# Patient Record
Sex: Male | Born: 1945 | Race: White | Hispanic: No | Marital: Married | Smoking: Never smoker
Health system: Southern US, Community
[De-identification: ages and names within clinical notes are randomized; demographics above are authoritative.]

## PROBLEM LIST (undated history)

## (undated) DIAGNOSIS — K5790 Diverticulosis of intestine, part unspecified, without perforation or abscess without bleeding: Secondary | ICD-10-CM

## (undated) DIAGNOSIS — I2510 Atherosclerotic heart disease of native coronary artery without angina pectoris: Secondary | ICD-10-CM

## (undated) DIAGNOSIS — C189 Malignant neoplasm of colon, unspecified: Secondary | ICD-10-CM

## (undated) DIAGNOSIS — R748 Abnormal levels of other serum enzymes: Secondary | ICD-10-CM

## (undated) DIAGNOSIS — J309 Allergic rhinitis, unspecified: Secondary | ICD-10-CM

## (undated) DIAGNOSIS — T7840XA Allergy, unspecified, initial encounter: Secondary | ICD-10-CM

## (undated) DIAGNOSIS — N529 Male erectile dysfunction, unspecified: Secondary | ICD-10-CM

## (undated) DIAGNOSIS — C61 Malignant neoplasm of prostate: Secondary | ICD-10-CM

## (undated) DIAGNOSIS — K635 Polyp of colon: Secondary | ICD-10-CM

## (undated) DIAGNOSIS — E785 Hyperlipidemia, unspecified: Secondary | ICD-10-CM

## (undated) DIAGNOSIS — K5792 Diverticulitis of intestine, part unspecified, without perforation or abscess without bleeding: Secondary | ICD-10-CM

## (undated) DIAGNOSIS — H8109 Meniere's disease, unspecified ear: Secondary | ICD-10-CM

## (undated) DIAGNOSIS — H3320 Serous retinal detachment, unspecified eye: Secondary | ICD-10-CM

## (undated) DIAGNOSIS — I10 Essential (primary) hypertension: Secondary | ICD-10-CM

## (undated) DIAGNOSIS — K219 Gastro-esophageal reflux disease without esophagitis: Secondary | ICD-10-CM

## (undated) DIAGNOSIS — H269 Unspecified cataract: Secondary | ICD-10-CM

## (undated) DIAGNOSIS — R011 Cardiac murmur, unspecified: Secondary | ICD-10-CM

## (undated) HISTORY — DX: Malignant neoplasm of prostate: C61

## (undated) HISTORY — DX: Allergy, unspecified, initial encounter: T78.40XA

## (undated) HISTORY — DX: Diverticulosis of intestine, part unspecified, without perforation or abscess without bleeding: K57.90

## (undated) HISTORY — PX: OTHER SURGICAL HISTORY: SHX169

## (undated) HISTORY — DX: Unspecified cataract: H26.9

## (undated) HISTORY — DX: Hemochromatosis, unspecified: E83.119

## (undated) HISTORY — PX: PROSTATECTOMY: SHX69

## (undated) HISTORY — DX: Hyperlipidemia, unspecified: E78.5

## (undated) HISTORY — DX: Gastro-esophageal reflux disease without esophagitis: K21.9

## (undated) HISTORY — DX: Meniere's disease, unspecified ear: H81.09

## (undated) HISTORY — DX: Diverticulitis of intestine, part unspecified, without perforation or abscess without bleeding: K57.92

## (undated) HISTORY — DX: Essential (primary) hypertension: I10

## (undated) HISTORY — DX: Malignant neoplasm of colon, unspecified: C18.9

## (undated) HISTORY — DX: Cardiac murmur, unspecified: R01.1

## (undated) HISTORY — DX: Atherosclerotic heart disease of native coronary artery without angina pectoris: I25.10

## (undated) HISTORY — DX: Male erectile dysfunction, unspecified: N52.9

## (undated) HISTORY — PX: APPENDECTOMY: SHX54

## (undated) HISTORY — PX: POLYPECTOMY: SHX149

## (undated) HISTORY — DX: Polyp of colon: K63.5

## (undated) HISTORY — DX: Allergic rhinitis, unspecified: J30.9

## (undated) HISTORY — DX: Serous retinal detachment, unspecified eye: H33.20

## (undated) HISTORY — DX: Abnormal levels of other serum enzymes: R74.8

## (undated) HISTORY — PX: RETINAL DETACHMENT SURGERY: SHX105

---

## 2009-04-29 ENCOUNTER — Encounter: Admission: RE | Admit: 2009-04-29 | Discharge: 2009-04-29 | Payer: Self-pay | Admitting: Internal Medicine

## 2011-09-13 DIAGNOSIS — R972 Elevated prostate specific antigen [PSA]: Secondary | ICD-10-CM | POA: Insufficient documentation

## 2012-02-06 HISTORY — PX: BASAL CELL CARCINOMA EXCISION: SHX1214

## 2013-02-13 DIAGNOSIS — C61 Malignant neoplasm of prostate: Secondary | ICD-10-CM | POA: Insufficient documentation

## 2013-03-09 DIAGNOSIS — I10 Essential (primary) hypertension: Secondary | ICD-10-CM | POA: Insufficient documentation

## 2013-12-03 ENCOUNTER — Other Ambulatory Visit (HOSPITAL_COMMUNITY): Payer: Self-pay | Admitting: Gastroenterology

## 2013-12-03 DIAGNOSIS — R7989 Other specified abnormal findings of blood chemistry: Secondary | ICD-10-CM

## 2013-12-03 DIAGNOSIS — R945 Abnormal results of liver function studies: Principal | ICD-10-CM

## 2013-12-03 DIAGNOSIS — K760 Fatty (change of) liver, not elsewhere classified: Secondary | ICD-10-CM

## 2013-12-30 ENCOUNTER — Ambulatory Visit (HOSPITAL_COMMUNITY): Payer: Medicare Other

## 2014-01-13 ENCOUNTER — Ambulatory Visit (HOSPITAL_COMMUNITY)
Admission: RE | Admit: 2014-01-13 | Discharge: 2014-01-13 | Disposition: A | Discharge status: Home / Self Care | Payer: Medicare Other | Source: Ambulatory Visit | Attending: Gastroenterology | Admitting: Gastroenterology

## 2014-01-13 DIAGNOSIS — K760 Fatty (change of) liver, not elsewhere classified: Secondary | ICD-10-CM

## 2014-01-13 DIAGNOSIS — R945 Abnormal results of liver function studies: Secondary | ICD-10-CM

## 2014-01-13 DIAGNOSIS — R7989 Other specified abnormal findings of blood chemistry: Secondary | ICD-10-CM

## 2014-05-26 ENCOUNTER — Encounter: Payer: Self-pay | Admitting: Cardiology

## 2014-05-26 ENCOUNTER — Ambulatory Visit (INDEPENDENT_AMBULATORY_CARE_PROVIDER_SITE_OTHER): Payer: Medicare HMO | Admitting: Cardiology

## 2014-05-26 VITALS — BP 130/80 | HR 88 | Ht 71.0 in | Wt 220.6 lb

## 2014-05-26 DIAGNOSIS — R42 Dizziness and giddiness: Secondary | ICD-10-CM | POA: Diagnosis not present

## 2014-05-26 DIAGNOSIS — R011 Cardiac murmur, unspecified: Secondary | ICD-10-CM

## 2014-05-26 DIAGNOSIS — R0609 Other forms of dyspnea: Secondary | ICD-10-CM | POA: Insufficient documentation

## 2014-05-26 NOTE — Patient Instructions (Signed)
Medication Instructions:   Your physician recommends that you continue on your current medications as directed. Please refer to the Current Medication list given to you today.     Testing/Procedures:  Your physician has requested that you have an echocardiogram. Echocardiography is a painless test that uses sound waves to create images of your heart. It provides your doctor with information about the size and shape of your heart and how well your heart's chambers and valves are working. This procedure takes approximately one hour. There are no restrictions for this procedure.   Your physician has requested that you have a stress echocardiogram. For further information please visit [email or web address]. Please follow instruction sheet as given.     Follow-Up:  Your physician recommends that you schedule a follow-up appointment in: [place]

## 2014-05-26 NOTE — Progress Notes (Signed)
Patient ID: Jeffrey Stein, male   DOB: [birth year], 69 y.o.   MRN: [id number]      Cardiology Office Note   Date:  05/26/2014   ID:  Jeffrey Stein, DOB Feb 26, 1945, MRN [id number]  PCP:  Dorian Heckle, MD  Cardiologist:  Dorothy Spark, MD   Chief Complaint  Patient presents with  . Appointment    consult   CC: DOE History of Present Illness: Jeffrey Stein is a 69 y.o. male who presents for the evaluation of DOE. The patient with h/o HTN has been experiencing DOE for the last 6 years. Six years ago underwent a treadmill test that was negative for ischemia, and normal PFT. He is retired and minimally active. He states that anytime he tries to walk upstairs he gets SOB very quickly and starts wheezing. It has been getting worse over years, he denies chest pain or resting SOB, no orthopnea or PND. No palpitations. He describes one episode of syncope after sitting in the chair for prolonged time and walking to the bathroom, with immediate recovery of concioussness. He has LE edema that is chronic.  He has no h/o asthma, and has never smoked. His father died of SCD, was smoker.     Past Medical History  Diagnosis Date  . Meniere's disease   . GERD (gastroesophageal reflux disease)   . HTN (hypertension)   . Allergic rhinitis   . Hemochromatosis   . Colon cancer   . Prostate cancer   . Hyperlipemia   . Abnormal liver enzymes     Past Surgical History  Procedure Laterality Date  . Prostatectomy       Current Outpatient Prescriptions  Medication Sig Dispense Refill  . amLODipine (NORVASC) 10 MG tablet Take 10 mg by mouth daily.    Marland Kitchen aspirin EC 81 MG tablet Take 81 mg by mouth daily.    . hydrochlorothiazide (HYDRODIURIL) 25 MG tablet Take 1 tablet by mouth daily.    Marland Kitchen losartan (COZAAR) 100 MG tablet Take 1 tablet by mouth daily.    . potassium chloride SA (K-DUR,KLOR-CON) 20 MEQ tablet Take 2 tablets by mouth daily.    . vitamin E 400 UNIT capsule Take 2 capsules by  mouth daily.     No current facility-administered medications for this visit.    Allergies:   Penicillin g; Iodinated diagnostic agents; and Shellfish allergy    Social History:  The patient  reports that he has never smoked. He does not have any smokeless tobacco history on file. He reports that he drinks alcohol. He reports that he does not use illicit drugs.   Family History:  The patient's family history includes Colon cancer in his mother; Emphysema in his father.    ROS:  Please see the history of present illness.  All other systems are reviewed and negative.    PHYSICAL EXAM: VS:  BP 130/80 mmHg  Pulse 88  Ht 5\' 11"  (1.803 m)  Wt 220 lb 9.6 oz (100.064 kg)  BMI 30.78 kg/m2 , BMI Body mass index is 30.78 kg/(m^2). GEN: Well nourished, well developed, in no acute distress HEENT: normal Neck: no JVD, carotid bruits, or masses Cardiac: RRR; 3/6 holosystolic murmur, rubs, or gallops,no edema  Respiratory:  clear to auscultation bilaterally, normal work of breathing GI: soft, nontender, nondistended, + BS MS: no deformity or atrophy, mild B/L LE edema Skin: warm and dry, no rash Neuro:  Strength and sensation are intact Psych: euthymic mood, full affect  EKG:  EKG :  SR, non-specific ST-T wave abnormalities  Recent Labs: No results found for requested labs within last 365 days.    Lipid Panel No results found for: CHOL, TRIG, HDL, CHOLHDL, VLDL, LDLCALC, LDLDIRECT    Wt Readings from Last 3 Encounters:  05/26/14 220 lb 9.6 oz (100.064 kg)     ASSESSMENT AND PLAN:  1.  DOE - loud murmur, we will order echocardiogram to evaluate, rule out LVOT obstruction, systolic and diastolic function. We will also order stress echocardiogram to evaluate for ischemia.   2. LE edema - no change in medication until echo results back  3. HTN - controlled on current regimen.   Labs/ tests ordered today include:  Orders Placed This Encounter  Procedures  . EKG 12-Lead  .  Echocardiogram stress test without contrast  . 2D Echocardiogram without contrast   Disposition:   FU with Kaydn Kumpf H in 3 months.  Signed, Dorothy Spark, MD  05/26/2014 5:08 PM    [facility] [place], [place], [place]  [address] Phone: [phone]; Fax: [phone]

## 2014-06-22 ENCOUNTER — Telehealth (HOSPITAL_COMMUNITY): Payer: Self-pay

## 2014-06-22 ENCOUNTER — Other Ambulatory Visit: Payer: Self-pay | Admitting: Cardiology

## 2014-06-22 DIAGNOSIS — R0609 Other forms of dyspnea: Secondary | ICD-10-CM

## 2014-06-22 NOTE — Telephone Encounter (Signed)
Patient given detailed instructions per Stress Test Requisition Sheet for test on 06-23-2014 at 3:00pm, arrive at 1:45 for 2D Echo. Patient verbalized understanding. Oletta Lamas, Zahira Brummond A

## 2014-06-23 ENCOUNTER — Ambulatory Visit (HOSPITAL_COMMUNITY): Payer: Medicare HMO | Attending: Cardiovascular Disease

## 2014-06-23 ENCOUNTER — Ambulatory Visit (HOSPITAL_BASED_OUTPATIENT_CLINIC_OR_DEPARTMENT_OTHER): Payer: Medicare HMO

## 2014-06-23 ENCOUNTER — Other Ambulatory Visit: Payer: Self-pay

## 2014-06-23 DIAGNOSIS — R42 Dizziness and giddiness: Secondary | ICD-10-CM

## 2014-06-23 DIAGNOSIS — R0609 Other forms of dyspnea: Secondary | ICD-10-CM | POA: Diagnosis not present

## 2014-06-23 DIAGNOSIS — E785 Hyperlipidemia, unspecified: Secondary | ICD-10-CM | POA: Diagnosis not present

## 2014-06-23 DIAGNOSIS — R011 Cardiac murmur, unspecified: Secondary | ICD-10-CM

## 2014-06-23 DIAGNOSIS — Z85038 Personal history of other malignant neoplasm of large intestine: Secondary | ICD-10-CM | POA: Diagnosis not present

## 2014-06-23 DIAGNOSIS — I10 Essential (primary) hypertension: Secondary | ICD-10-CM | POA: Diagnosis not present

## 2014-06-23 DIAGNOSIS — Z8546 Personal history of malignant neoplasm of prostate: Secondary | ICD-10-CM | POA: Diagnosis not present

## 2014-06-23 DIAGNOSIS — K219 Gastro-esophageal reflux disease without esophagitis: Secondary | ICD-10-CM | POA: Diagnosis not present

## 2014-06-23 LAB — ECHOCARDIOGRAM STRESS TEST
Estimated workload: 10.5 METS
Peak HR: 151 {beats}/min
Percent of predicted max HR: 99 %
Stage 1 DBP: 100 mmHg
Stage 1 Grade: 0 %
Stage 1 HR: 97 {beats}/min
Stage 1 SBP: 137 mmHg
Stage 1 Speed: 0 mph
Stage 2 DBP: 96 mmHg
Stage 2 Grade: 0 %
Stage 2 HR: 102 {beats}/min
Stage 2 SBP: 123 mmHg
Stage 2 Speed: 0 mph
Stage 3 Grade: 0 %
Stage 3 HR: 102 {beats}/min
Stage 3 Speed: 0 mph
Stage 4 DBP: 84 mmHg
Stage 4 Grade: 10 %
Stage 4 HR: 113 {beats}/min
Stage 4 SBP: 148 mmHg
Stage 4 Speed: 1.7 mph
Stage 5 DBP: 77 mmHg
Stage 5 Grade: 12 %
Stage 5 HR: 127 {beats}/min
Stage 5 SBP: 176 mmHg
Stage 5 Speed: 2.5 mph
Stage 6 DBP: 81 mmHg
Stage 6 Grade: 14 %
Stage 6 HR: 150 {beats}/min
Stage 6 SBP: 196 mmHg
Stage 6 Speed: 3.4 mph
Stage 7 Grade: 16 %
Stage 7 HR: 151 {beats}/min
Stage 7 Speed: 4.2 mph
Stage 8 DBP: 83 mmHg
Stage 8 Grade: 0 %
Stage 8 HR: 136 {beats}/min
Stage 8 SBP: 193 mmHg
Stage 8 Speed: 0 mph
Stage 9 DBP: 74 mmHg
Stage 9 Grade: 0 %
Stage 9 HR: 97 {beats}/min
Stage 9 SBP: 135 mmHg
Stage 9 Speed: 0 mph

## 2014-07-21 ENCOUNTER — Encounter: Payer: Self-pay | Admitting: Cardiology

## 2014-07-29 ENCOUNTER — Encounter: Payer: Self-pay | Admitting: Cardiology

## 2014-09-06 ENCOUNTER — Ambulatory Visit: Payer: Self-pay | Admitting: Cardiology

## 2014-09-24 ENCOUNTER — Ambulatory Visit (INDEPENDENT_AMBULATORY_CARE_PROVIDER_SITE_OTHER): Payer: Medicare HMO | Admitting: Cardiology

## 2014-09-24 ENCOUNTER — Encounter: Payer: Self-pay | Admitting: Cardiology

## 2014-09-24 VITALS — BP 132/70 | HR 72 | Ht 71.0 in | Wt 221.0 lb

## 2014-09-24 DIAGNOSIS — R0609 Other forms of dyspnea: Secondary | ICD-10-CM

## 2014-09-24 DIAGNOSIS — R0600 Dyspnea, unspecified: Secondary | ICD-10-CM

## 2014-09-24 DIAGNOSIS — E785 Hyperlipidemia, unspecified: Secondary | ICD-10-CM

## 2014-09-24 DIAGNOSIS — I10 Essential (primary) hypertension: Secondary | ICD-10-CM

## 2014-09-24 DIAGNOSIS — I350 Nonrheumatic aortic (valve) stenosis: Secondary | ICD-10-CM

## 2014-09-24 LAB — COMPREHENSIVE METABOLIC PANEL
ALT: 75 U/L — ABNORMAL HIGH (ref 0–53)
AST: 63 U/L — ABNORMAL HIGH (ref 0–37)
Albumin: 4.3 g/dL (ref 3.5–5.2)
Alkaline Phosphatase: 39 U/L (ref 39–117)
BUN: 35 mg/dL — ABNORMAL HIGH (ref 6–23)
CO2: 26 mEq/L (ref 19–32)
Calcium: 9.4 mg/dL (ref 8.4–10.5)
Chloride: 100 mEq/L (ref 96–112)
Creatinine, Ser: 1.87 mg/dL — ABNORMAL HIGH (ref 0.40–1.50)
GFR: 38.28 mL/min — ABNORMAL LOW (ref >60.00)
Glucose, Bld: 118 mg/dL — ABNORMAL HIGH (ref 70–99)
Potassium: 3.5 mEq/L (ref 3.5–5.1)
Sodium: 137 mEq/L (ref 135–145)
Total Bilirubin: 1 mg/dL (ref 0.2–1.2)
Total Protein: 8.1 g/dL (ref 6.0–8.3)

## 2014-09-24 NOTE — Patient Instructions (Signed)
Medication Instructions:   Your physician recommends that you continue on your current medications as directed. Please refer to the Current Medication list given to you today.    Labwork:  TODAY---CMET AND NMR WITH LIPIDS     Follow-Up:  Your physician wants you to follow-up in: [place] will receive a reminder letter in the mail two months in advance. If you don't receive a letter, please call our office to schedule the follow-up appointment.

## 2014-09-24 NOTE — Progress Notes (Signed)
Patient ID: Jeffrey Stein, male   DOB: 13-Mar-1945, 69 y.o.   MRN: [id number]      Cardiology Office Note  Date:  09/24/2014   ID:  Jeffrey Stein, DOB 23-Dec-1945, MRN [id number]  PCP:  Dorian Heckle, MD  Cardiologist:  Dorothy Spark, MD   No chief complaint on file.  CC: DOE  History of Present Illness: Jeffrey Stein is a 69 y.o. male who presents for the evaluation of DOE. The patient with h/o HTN has been experiencing DOE for the last 6 years. Six years ago underwent a treadmill test that was negative for ischemia, and normal PFT. He is retired and minimally active. He states that anytime he tries to walk upstairs he gets SOB very quickly and starts wheezing. It has been getting worse over years, he denies chest pain or resting SOB, no orthopnea or PND. No palpitations. He describes one episode of syncope after sitting in the chair for prolonged time and walking to the bathroom, with immediate recovery of concioussness. He has LE edema that is chronic.  He has no h/o asthma, and has never smoked. His father died of SCD, was smoker.   Follow pu visit, his SOB improved with omeprazole prescribed by pulmonologist, he doubled the dose and developed rash. No CP, palpitations or syncope.   Past Medical History  Diagnosis Date  . Meniere's disease   . GERD (gastroesophageal reflux disease)   . HTN (hypertension)   . Allergic rhinitis   . Hemochromatosis   . Colon cancer   . Prostate cancer   . Hyperlipemia   . Abnormal liver enzymes    Past Surgical History  Procedure Laterality Date  . Prostatectomy     Current Outpatient Prescriptions  Medication Sig Dispense Refill  . amLODipine (NORVASC) 10 MG tablet Take 10 mg by mouth daily.    Marland Kitchen aspirin EC 81 MG tablet Take 81 mg by mouth daily.    . hydrochlorothiazide (HYDRODIURIL) 25 MG tablet Take 1 tablet by mouth daily.    Marland Kitchen losartan (COZAAR) 100 MG tablet Take 1 tablet by mouth daily.    Marland Kitchen omeprazole (PRILOSEC) 40 MG  capsule Take 1 capsule by mouth daily.    . potassium chloride SA (K-DUR,KLOR-CON) 20 MEQ tablet Take 2 tablets by mouth daily.    . vitamin E 400 UNIT capsule Take 2 capsules by mouth daily.     No current facility-administered medications for this visit.   Allergies:   Penicillin g; Iodinated diagnostic agents; Omeprazole; and Shellfish allergy    Social History:  The patient  reports that he has never smoked. He does not have any smokeless tobacco history on file. He reports that he drinks alcohol. He reports that he does not use illicit drugs.   Family History:  The patient's family history includes Colon cancer in his mother; Emphysema in his father.   ROS:  Please see the history of present illness.  All other systems are reviewed and negative.   PHYSICAL EXAM: VS:  BP 132/70 mmHg  Pulse 72  Ht 5\' 11"  (1.803 m)  Wt 221 lb (100.245 kg)  BMI 30.84 kg/m2  SpO2 95% , BMI Body mass index is 30.84 kg/(m^2). GEN: Well nourished, well developed, in no acute distress HEENT: normal Neck: no JVD, carotid bruits, or masses Cardiac: RRR; 3/6 holosystolic murmur, rubs, or gallops,no edema  Respiratory:  clear to auscultation bilaterally, normal work of breathing GI: soft, nontender, nondistended, + BS MS: no deformity or atrophy, mild  B/L LE edema Skin: warm and dry, no rash Neuro:  Strength and sensation are intact Psych: euthymic mood, full affect  EKG:  EKG : SR, non-specific ST-T wave abnormalities  Recent Labs: No results found for requested labs within last 365 days.   Lipid Panel No results found for: CHOL, TRIG, HDL, CHOLHDL, VLDL, LDLCALC, LDLDIRECT   Wt Readings from Last 3 Encounters:  09/24/14 221 lb (100.245 kg)  05/26/14 220 lb 9.6 oz (100.064 kg)    TTE: 06/2014 - Left ventricle: The cavity size was normal. There was mild focal basal hypertrophy of the septum. Systolic function was normal. The estimated ejection fraction was in the range of 55% to  60%. Wall motion was normal; there were no regional wall motion abnormalities. Doppler parameters are consistent with abnormal left ventricular relaxation (grade 1 diastolic dysfunction). - Aortic valve: There was very mild stenosis. Valve area (VTI): 1.8 cm^2. Valve area (Vmean): 1.44 cm^2. - Left atrium: The atrium was mildly dilated.    ASSESSMENT AND PLAN:  1.  DOE - possibly ealated to GERD and reactive airways, echo shows normal LVEF< grade 1 DD, mild AS  2. Mild AS - we will follow in 1 year, echo in 2 years, check lipids, start statins cautiously as he developed LFTs elevation in the past  3. LE edema - improved  4. HTN - controlled on current regimen.  Labs/ tests ordered today include:  Orders Placed This Encounter  Procedures  . EKG 12-Lead  . Echocardiogram stress test without contrast  . 2D Echocardiogram without contrast   Disposition:   FU with Calvert Charland H in 3 months.  Signed, Dorothy Spark, MD  09/24/2014 10:26 AM    [facility] [place], [place], [place]  [address] Phone: [phone]; Fax: [phone]

## 2014-09-27 ENCOUNTER — Telehealth: Payer: Self-pay | Admitting: Cardiology

## 2014-09-27 DIAGNOSIS — R899 Unspecified abnormal finding in specimens from other organs, systems and tissues: Secondary | ICD-10-CM

## 2014-09-27 LAB — NMR LIPOPROFILE WITH LIPIDS
Cholesterol, Total: 255 mg/dL — ABNORMAL HIGH (ref 100–199)
HDL Particle Number: 23.8 umol/L — ABNORMAL LOW (ref >=30.5)
HDL Size: 8 nm — ABNORMAL LOW (ref >=9.2)
HDL-C: 40 mg/dL (ref >39)
LDL (calc): 187 mg/dL — ABNORMAL HIGH (ref 0–99)
LDL Particle Number: 2372 nmol/L — ABNORMAL HIGH (ref <1000)
LDL Size: 20.8 nm (ref >=20.8)
LP-IR Score: 76 — ABNORMAL HIGH (ref <=45)
Large HDL-P: <1.3 umol/L — ABNORMAL LOW (ref >=4.8)
Large VLDL-P: 5.1 nmol/L — ABNORMAL HIGH (ref <=2.7)
Small LDL Particle Number: 1156 nmol/L — ABNORMAL HIGH (ref <=527)
Triglycerides: 139 mg/dL (ref 0–149)
VLDL Size: 46.3 nm (ref <=46.6)

## 2014-09-27 MED ORDER — ATENOLOL 25 MG PO TABS
25.0000 mg | ORAL_TABLET | Freq: Every day | ORAL | Status: DC
Start: 2014-09-27 — End: 2015-08-25

## 2014-09-27 NOTE — Telephone Encounter (Signed)
Pateint called triage about VM. Returned patient's call. Informed patient of Dr. Francesca Oman lab result note. Ordered Atenolol 25 mg by mouth daily  and discontinued HCTZ and Losartan. Ordered and scheduled BMP for October 26, 2014. Patient verbalized understanding.   Notes Recorded by Nuala Alpha, LPN on 9/93/7169 at 6:78 PM Left detailed message on both of the pts confirmed VM that per Dr Meda Coffee his labs indicated abnormal kidney function, and she has new medication and lab orders to endorse to him.  Left detailed message on both of the pts confirmed VM that he should call the office back on Monday 8/22 and ask to speak with a triage nurse to go over lab results and recommendations per Dr Meda Coffee.  Will forward this message to triage to follow-up with the pt on Monday 8/22. Notes Recorded by Dorothy Spark, MD on 09/24/2014 at 4:49 PM This patient has abnormal kidney function, I would recommend to Notes Recorded by Nuala Alpha, LPN on 9/38/1017 at 5:10 PM Left detailed message on both of the pts confirmed VM that per Dr Meda Coffee his labs indicated abnormal kidney function, and she has new medication and lab orders to endorse to him.  Left detailed message on both of the pts confirmed VM that he should call the office back on Monday 8/22 and ask to speak with a triage nurse to go over lab results and recommendations per Dr Meda Coffee.  Will forward this message to triage to follow-up with the pt on Monday 8/22. Notes Recorded by Dorothy Spark, MD on 09/24/2014 at 4:49 PM This patient has abnormal kidney function, I would recommend to discontinue losartan and HCTZ and start atenolol 25 mg po daily for BP. He should have repeat BMP in 1 month.

## 2014-09-27 NOTE — Telephone Encounter (Signed)
Follow Up  Pt states that he received a VM that advised him to call and speak with a triage nurse. Please assist

## 2014-09-28 ENCOUNTER — Telehealth: Payer: Self-pay | Admitting: *Deleted

## 2014-09-28 DIAGNOSIS — E785 Hyperlipidemia, unspecified: Secondary | ICD-10-CM

## 2014-09-28 MED ORDER — ATORVASTATIN CALCIUM 20 MG PO TABS: 20.0000 mg | ORAL_TABLET | Freq: Every day | ORAL | Status: DC

## 2014-09-28 NOTE — Telephone Encounter (Signed)
-----   Message from Dorothy Spark, MD sent at 09/28/2014 12:54 PM EDT ----- His cholesterol is significantly elevated, he needs to be started on atorvastatin 20 mg po daily and have lipids2 months.

## 2014-09-28 NOTE — Telephone Encounter (Signed)
Notified the pt that per Dr Meda Coffee his cholesterol is significantly elevated and she recommends that he start taking atorvastatin 20 mg po daily and have fasting lipids done in 2 months.  Confirmed the pharmacy of choice with the pt.  Advised the pt to come fasting to his lab appt to check lipids.  Lab appt scheduled for 11/29/14 at our office.  Pt verbalized understanding and agrees with this plan.

## 2014-10-26 ENCOUNTER — Other Ambulatory Visit (INDEPENDENT_AMBULATORY_CARE_PROVIDER_SITE_OTHER): Payer: Medicare HMO | Admitting: *Deleted

## 2014-10-26 DIAGNOSIS — E785 Hyperlipidemia, unspecified: Secondary | ICD-10-CM

## 2014-10-26 DIAGNOSIS — R899 Unspecified abnormal finding in specimens from other organs, systems and tissues: Secondary | ICD-10-CM | POA: Diagnosis not present

## 2014-10-26 LAB — BASIC METABOLIC PANEL
BUN: 15 mg/dL (ref 6–23)
CO2: 29 mEq/L (ref 19–32)
Calcium: 9.7 mg/dL (ref 8.4–10.5)
Chloride: 101 mEq/L (ref 96–112)
Creatinine, Ser: 0.94 mg/dL (ref 0.40–1.50)
GFR: 84.64 mL/min (ref >60.00)
Glucose, Bld: 123 mg/dL — ABNORMAL HIGH (ref 70–99)
Potassium: 3.6 mEq/L (ref 3.5–5.1)
Sodium: 140 mEq/L (ref 135–145)

## 2014-10-26 LAB — LIPID PANEL
Cholesterol: 172 mg/dL (ref 0–200)
HDL: 50 mg/dL (ref >39.00)
LDL Cholesterol: 106 mg/dL — ABNORMAL HIGH (ref 0–99)
NonHDL: 121.57
Total CHOL/HDL Ratio: 3
Triglycerides: 76 mg/dL (ref 0.0–149.0)
VLDL: 15.2 mg/dL (ref 0.0–40.0)

## 2014-10-26 NOTE — Addendum Note (Signed)
Addended by: Eulis Foster on: 10/26/2014 09:36 AM   Modules accepted: Orders

## 2014-10-28 ENCOUNTER — Telehealth: Payer: Self-pay | Admitting: *Deleted

## 2014-10-28 DIAGNOSIS — E785 Hyperlipidemia, unspecified: Secondary | ICD-10-CM

## 2014-10-28 NOTE — Telephone Encounter (Signed)
-----   Message from Dorothy Spark, MD sent at 10/27/2014  5:43 PM EDT ----- He has excellent response to atorvastatin, now 106 what is acceptable, I would continue the same dose. He has normal BMP, but no LFTs, can they add to the blood they draw today?

## 2014-10-28 NOTE — Telephone Encounter (Signed)
Added cmet onto pts already scheduled lab for 11/29/14 at our office to recheck lipids.  Will endorse this to Dr Meda Coffee to make her aware of the add on.

## 2014-11-29 ENCOUNTER — Other Ambulatory Visit: Payer: Self-pay | Admitting: Cardiology

## 2014-11-29 ENCOUNTER — Telehealth: Payer: Self-pay | Admitting: *Deleted

## 2014-11-29 ENCOUNTER — Other Ambulatory Visit: Payer: Medicare HMO | Admitting: *Deleted

## 2014-11-29 DIAGNOSIS — E785 Hyperlipidemia, unspecified: Secondary | ICD-10-CM

## 2014-11-29 NOTE — Telephone Encounter (Signed)
Per pts GI Dr. Paulita Fujita, fax received for the pt to have a LFT added on to his labs for today 10/24.  Order a CMET.  Per Dr Paulita Fujita at [place], once Dr Meda Coffee has reviewed these results, Dr Paulita Fujita request we fax this to his office at [phone] Attention Christine and Dr Paulita Fujita.  Phone number for Dr Paulita Fujita is [phone].

## 2014-11-30 LAB — COMPREHENSIVE METABOLIC PANEL
ALT: 102 U/L — ABNORMAL HIGH (ref 9–46)
AST: 77 U/L — ABNORMAL HIGH (ref 10–35)
Albumin: 4.2 g/dL (ref 3.6–5.1)
Alkaline Phosphatase: 47 U/L (ref 40–115)
BUN: 15 mg/dL (ref 7–25)
CO2: 25 mmol/L (ref 20–31)
Calcium: 9.6 mg/dL (ref 8.6–10.3)
Chloride: 101 mmol/L (ref 98–110)
Creat: 1 mg/dL (ref 0.70–1.25)
Glucose, Bld: 118 mg/dL — ABNORMAL HIGH (ref 65–99)
Potassium: 3.9 mmol/L (ref 3.5–5.3)
Sodium: 139 mmol/L (ref 135–146)
Total Bilirubin: 0.9 mg/dL (ref 0.2–1.2)
Total Protein: 7.5 g/dL (ref 6.1–8.1)

## 2014-11-30 LAB — LIPID PANEL
Cholesterol: 165 mg/dL (ref 125–200)
HDL: 44 mg/dL (ref >=40)
LDL Cholesterol: 105 mg/dL (ref <130)
Total CHOL/HDL Ratio: 3.8 Ratio (ref <=5.0)
Triglycerides: 78 mg/dL (ref <150)
VLDL: 16 mg/dL (ref <30)

## 2014-12-01 ENCOUNTER — Telehealth: Payer: Self-pay | Admitting: *Deleted

## 2014-12-01 DIAGNOSIS — R748 Abnormal levels of other serum enzymes: Secondary | ICD-10-CM

## 2014-12-01 DIAGNOSIS — E785 Hyperlipidemia, unspecified: Secondary | ICD-10-CM

## 2014-12-01 NOTE — Telephone Encounter (Signed)
Notified the pt that his labs have been reviewed and per Dr Meda Coffee, all his lipids were great, LFTs slightly higher from prior, and she recommends that we repeat his CMET in one month, and if still rising we would switch his statin.  Informed the pt that I will route this note with Dr Francesca Oman recommendations and his results to Dr Paulita Fujita at [place], for he requested that we add an LFT to his lipid appt.   Scheduled the pt a lab appt for one month on 01/03/15 at our office to repeat a cmet.  Pt verbalized understanding and agrees with this plan.

## 2014-12-01 NOTE — Telephone Encounter (Signed)
-----   Message from Dorothy Spark, MD sent at 12/01/2014  9:53 AM EDT ----- All lipids great, LFTs slightly higher from prior, I would repeat CMP only in 1 month, if still rising we would switch statin. Thank you

## 2014-12-09 DIAGNOSIS — Z83518 Family history of other specified eye disorder: Secondary | ICD-10-CM | POA: Diagnosis not present

## 2014-12-09 DIAGNOSIS — I10 Essential (primary) hypertension: Secondary | ICD-10-CM | POA: Diagnosis not present

## 2014-12-09 DIAGNOSIS — H52223 Regular astigmatism, bilateral: Secondary | ICD-10-CM | POA: Diagnosis not present

## 2014-12-09 DIAGNOSIS — Z961 Presence of intraocular lens: Secondary | ICD-10-CM | POA: Diagnosis not present

## 2014-12-09 DIAGNOSIS — H43392 Other vitreous opacities, left eye: Secondary | ICD-10-CM | POA: Diagnosis not present

## 2014-12-09 DIAGNOSIS — H5213 Myopia, bilateral: Secondary | ICD-10-CM | POA: Diagnosis not present

## 2014-12-09 DIAGNOSIS — Z8249 Family history of ischemic heart disease and other diseases of the circulatory system: Secondary | ICD-10-CM | POA: Diagnosis not present

## 2014-12-09 DIAGNOSIS — H04123 Dry eye syndrome of bilateral lacrimal glands: Secondary | ICD-10-CM | POA: Diagnosis not present

## 2014-12-10 DIAGNOSIS — Z1389 Encounter for screening for other disorder: Secondary | ICD-10-CM | POA: Diagnosis not present

## 2014-12-10 DIAGNOSIS — K760 Fatty (change of) liver, not elsewhere classified: Secondary | ICD-10-CM | POA: Diagnosis not present

## 2014-12-10 DIAGNOSIS — I10 Essential (primary) hypertension: Secondary | ICD-10-CM | POA: Diagnosis not present

## 2014-12-10 DIAGNOSIS — K219 Gastro-esophageal reflux disease without esophagitis: Secondary | ICD-10-CM | POA: Diagnosis not present

## 2014-12-10 DIAGNOSIS — E785 Hyperlipidemia, unspecified: Secondary | ICD-10-CM | POA: Diagnosis not present

## 2014-12-10 DIAGNOSIS — Z23 Encounter for immunization: Secondary | ICD-10-CM | POA: Diagnosis not present

## 2014-12-10 DIAGNOSIS — R251 Tremor, unspecified: Secondary | ICD-10-CM | POA: Diagnosis not present

## 2014-12-10 DIAGNOSIS — Z0000 Encounter for general adult medical examination without abnormal findings: Secondary | ICD-10-CM | POA: Diagnosis not present

## 2014-12-27 DIAGNOSIS — C61 Malignant neoplasm of prostate: Secondary | ICD-10-CM | POA: Diagnosis not present

## 2015-01-03 ENCOUNTER — Other Ambulatory Visit (INDEPENDENT_AMBULATORY_CARE_PROVIDER_SITE_OTHER): Payer: Medicare HMO | Admitting: *Deleted

## 2015-01-03 DIAGNOSIS — E785 Hyperlipidemia, unspecified: Secondary | ICD-10-CM | POA: Diagnosis not present

## 2015-01-03 DIAGNOSIS — R748 Abnormal levels of other serum enzymes: Secondary | ICD-10-CM | POA: Diagnosis not present

## 2015-01-03 LAB — COMPREHENSIVE METABOLIC PANEL
ALT: 97 U/L — ABNORMAL HIGH (ref 9–46)
AST: 92 U/L — ABNORMAL HIGH (ref 10–35)
Albumin: 4.2 g/dL (ref 3.6–5.1)
Alkaline Phosphatase: 45 U/L (ref 40–115)
BUN: 25 mg/dL (ref 7–25)
CO2: 25 mmol/L (ref 20–31)
Calcium: 9.2 mg/dL (ref 8.6–10.3)
Chloride: 100 mmol/L (ref 98–110)
Creat: 1.24 mg/dL (ref 0.70–1.25)
Glucose, Bld: 102 mg/dL — ABNORMAL HIGH (ref 65–99)
Potassium: 4.6 mmol/L (ref 3.5–5.3)
Sodium: 137 mmol/L (ref 135–146)
Total Bilirubin: 0.9 mg/dL (ref 0.2–1.2)
Total Protein: 7.8 g/dL (ref 6.1–8.1)

## 2015-01-03 LAB — T4, FREE: Free T4: 1.3 ng/dL (ref 0.80–1.80)

## 2015-01-03 LAB — TSH: TSH: 1.64 u[IU]/mL (ref 0.350–4.500)

## 2015-01-04 ENCOUNTER — Telehealth: Payer: Self-pay | Admitting: *Deleted

## 2015-01-04 DIAGNOSIS — R748 Abnormal levels of other serum enzymes: Secondary | ICD-10-CM

## 2015-01-04 DIAGNOSIS — E785 Hyperlipidemia, unspecified: Secondary | ICD-10-CM

## 2015-01-04 NOTE — Telephone Encounter (Signed)
Informed the pt that per Dr Meda Coffee and Elberta Leatherwood PharmD, his labs showed elevated liver function, so both parties recommend that the pt stop taking atorvastatin and recheck his LFTs in 4 weeks.  Per the pt he request that his lab appt be on 02/08/15, which is the 5 week mark, for he will be in town on that day.  Advised the pt to hold his statin until then.  Lab appt made to recheck LFT for 02/08/15.  Pt verbalized understanding and agrees with this plan.

## 2015-01-04 NOTE — Telephone Encounter (Signed)
-----   Message from Dorothy Spark, MD sent at 01/04/2015  2:28 PM EST ----- Karlene Einstein please have her stop taking atorvastatin then recheck LFTs in 4 weeks. Thank you,  Houston Siren  ----- Message -----    From: Aris Georgia, [facility]    Sent: 01/04/2015   2:10 PM      To: Dorothy Spark, MD  I might hold for 1 month just to make sure they normalize or at least improve then restart statin.   ----- Message -----    From: Dorothy Spark, MD    Sent: 01/04/2015  12:19 PM      To: Aris Georgia, [facility]  Gay Filler, this guy has no CAD but aortic stenosis, on 20 mg of atorvastatin, I am planning to decrease to 10 mg po daily because of elevated LFTs, do you agree? Thank you, Houston Siren  ----- Message -----    From: Lab in [facility]    Sent: 01/03/2015   7:36 PM      To: Dorothy Spark, MD

## 2015-02-08 ENCOUNTER — Other Ambulatory Visit (INDEPENDENT_AMBULATORY_CARE_PROVIDER_SITE_OTHER): Payer: Medicare HMO | Admitting: *Deleted

## 2015-02-08 DIAGNOSIS — E785 Hyperlipidemia, unspecified: Secondary | ICD-10-CM

## 2015-02-08 DIAGNOSIS — R748 Abnormal levels of other serum enzymes: Secondary | ICD-10-CM | POA: Diagnosis not present

## 2015-02-08 LAB — HEPATIC FUNCTION PANEL
ALT: 90 U/L — ABNORMAL HIGH (ref 9–46)
AST: 67 U/L — ABNORMAL HIGH (ref 10–35)
Albumin: 4.1 g/dL (ref 3.6–5.1)
Alkaline Phosphatase: 45 U/L (ref 40–115)
Bilirubin, Direct: 0.3 mg/dL — ABNORMAL HIGH (ref <=0.2)
Indirect Bilirubin: 0.6 mg/dL (ref 0.2–1.2)
Total Bilirubin: 0.9 mg/dL (ref 0.2–1.2)
Total Protein: 7.8 g/dL (ref 6.1–8.1)

## 2015-02-09 ENCOUNTER — Telehealth: Payer: Self-pay | Admitting: Cardiology

## 2015-02-09 NOTE — Telephone Encounter (Signed)
Notified the pt that per Dr Meda Coffee and Elberta Leatherwood Pharm D, pt has had elevated LFTs, prior to starting statin, and his current LFT is similar to baseline numbers we have.  Informed the pt that per Dr Meda Coffee and Gay Filler, it is ok for him to start back taking his statin, at his current dose, as long as there is no significant increase in LFTs.  Pt states he will be going to his PCP for a routine check-up in the near future, and he will have his PCP fax any labs drawn there, to Dr Meda Coffee for further review and follow-up.  Pt verbalized understanding of results given, and agrees with this plan.

## 2015-02-09 NOTE — Telephone Encounter (Signed)
New message ° ° ° ° °Returning a call to the nurse °

## 2015-04-25 ENCOUNTER — Telehealth: Payer: Self-pay | Admitting: Cardiology

## 2015-04-25 DIAGNOSIS — Z789 Other specified health status: Secondary | ICD-10-CM

## 2015-04-25 DIAGNOSIS — E785 Hyperlipidemia, unspecified: Secondary | ICD-10-CM

## 2015-04-25 DIAGNOSIS — R748 Abnormal levels of other serum enzymes: Secondary | ICD-10-CM

## 2015-04-25 NOTE — Telephone Encounter (Signed)
Patient calling to report possible allergy to Lipitor. He is experiencing increased muscle stiffness/cramping in his legs, lower back and arms. He now has a tremor in his fingers as well.  Pt reports feeling fatigued and has a loss of appetite.  He denies CP and says his mild SOB is chronic. He has started Prilosec and has had some improvement. He would like to change Lipitor. He st the only other medication he has tried is Simvastatin and he had similar symptoms with that as well. Patient would like Dr. Francesca Oman recommendations on changing Lipitor and starting CoQ-10 in conjunction with new medication.  To Dr. Meda Coffee.

## 2015-04-25 NOTE — Telephone Encounter (Signed)
Pt c/o medication issue:  1. Name of Medication: Atorvastatin 20 2. How are you currently taking this medication (dosage and times per day)? 1x day 3. Are you having a reaction (difficulty breathing--STAT)? yes  4. What is your medication issue? Muscle pain, weakness & Finger tremors

## 2015-04-26 DIAGNOSIS — Z789 Other specified health status: Secondary | ICD-10-CM | POA: Insufficient documentation

## 2015-04-26 NOTE — Telephone Encounter (Signed)
please discontinue and check CPK and CMP in 4 weeks.

## 2015-04-26 NOTE — Telephone Encounter (Signed)
Follow up  ° ° °Patient calling back to speak with nurse  °

## 2015-04-26 NOTE — Telephone Encounter (Signed)
Notified the pt that per Dr Meda Coffee, she recommends that we discontinue his Lipitor and check a CPK and a CMET on him in 4 weeks.  Updated the pts allergies with statin intolerance to Lipitor, causing finger tremors and muscle aches/cramps.  Discontinued this med out of the pts chart.  Scheduled the pt to come in to have his labs checked for 4 weeks, on 05/24/15 at our office to check a cmet and cpk. Pt verbalized understanding and agrees with this plan.

## 2015-05-02 DIAGNOSIS — C61 Malignant neoplasm of prostate: Secondary | ICD-10-CM | POA: Diagnosis not present

## 2015-05-02 DIAGNOSIS — E785 Hyperlipidemia, unspecified: Secondary | ICD-10-CM | POA: Diagnosis not present

## 2015-05-09 DIAGNOSIS — I10 Essential (primary) hypertension: Secondary | ICD-10-CM | POA: Diagnosis not present

## 2015-05-09 DIAGNOSIS — E7800 Pure hypercholesterolemia, unspecified: Secondary | ICD-10-CM | POA: Diagnosis not present

## 2015-05-09 DIAGNOSIS — I25119 Atherosclerotic heart disease of native coronary artery with unspecified angina pectoris: Secondary | ICD-10-CM | POA: Diagnosis not present

## 2015-05-09 DIAGNOSIS — R0609 Other forms of dyspnea: Secondary | ICD-10-CM | POA: Diagnosis not present

## 2015-05-24 ENCOUNTER — Other Ambulatory Visit (INDEPENDENT_AMBULATORY_CARE_PROVIDER_SITE_OTHER): Payer: Medicare HMO | Admitting: *Deleted

## 2015-05-24 DIAGNOSIS — E785 Hyperlipidemia, unspecified: Secondary | ICD-10-CM

## 2015-05-24 DIAGNOSIS — Z789 Other specified health status: Secondary | ICD-10-CM

## 2015-05-24 DIAGNOSIS — R748 Abnormal levels of other serum enzymes: Secondary | ICD-10-CM

## 2015-05-24 DIAGNOSIS — Z889 Allergy status to unspecified drugs, medicaments and biological substances status: Secondary | ICD-10-CM | POA: Diagnosis not present

## 2015-05-24 LAB — COMPREHENSIVE METABOLIC PANEL
ALT: 96 U/L — ABNORMAL HIGH (ref 9–46)
AST: 98 U/L — ABNORMAL HIGH (ref 10–35)
Albumin: 4.4 g/dL (ref 3.6–5.1)
Alkaline Phosphatase: 50 U/L (ref 40–115)
BUN: 29 mg/dL — ABNORMAL HIGH (ref 7–25)
CO2: 26 mmol/L (ref 20–31)
Calcium: 9.5 mg/dL (ref 8.6–10.3)
Chloride: 101 mmol/L (ref 98–110)
Creat: 1.46 mg/dL — ABNORMAL HIGH (ref 0.70–1.25)
Glucose, Bld: 125 mg/dL — ABNORMAL HIGH (ref 65–99)
Potassium: 4.3 mmol/L (ref 3.5–5.3)
Sodium: 139 mmol/L (ref 135–146)
Total Bilirubin: 0.9 mg/dL (ref 0.2–1.2)
Total Protein: 8 g/dL (ref 6.1–8.1)

## 2015-05-24 LAB — CK: Total CK: 82 U/L (ref 7–232)

## 2015-07-21 ENCOUNTER — Encounter: Payer: Self-pay | Admitting: Internal Medicine

## 2015-07-21 ENCOUNTER — Ambulatory Visit (INDEPENDENT_AMBULATORY_CARE_PROVIDER_SITE_OTHER): Payer: Medicare HMO | Admitting: Internal Medicine

## 2015-07-21 VITALS — BP 130/80 | HR 59 | Temp 98.4°F | Resp 22 | Ht 71.5 in | Wt 219.0 lb

## 2015-07-21 DIAGNOSIS — E785 Hyperlipidemia, unspecified: Secondary | ICD-10-CM

## 2015-07-21 DIAGNOSIS — R7989 Other specified abnormal findings of blood chemistry: Secondary | ICD-10-CM

## 2015-07-21 DIAGNOSIS — Z789 Other specified health status: Secondary | ICD-10-CM

## 2015-07-21 DIAGNOSIS — R748 Abnormal levels of other serum enzymes: Secondary | ICD-10-CM

## 2015-07-21 DIAGNOSIS — R945 Abnormal results of liver function studies: Principal | ICD-10-CM

## 2015-07-21 DIAGNOSIS — Z889 Allergy status to unspecified drugs, medicaments and biological substances status: Secondary | ICD-10-CM

## 2015-07-21 MED ORDER — IPRATROPIUM BROMIDE 0.06 % NA SOLN: 2.0000 | Freq: Three times a day (TID) | NASAL | Status: DC

## 2015-07-21 NOTE — Patient Instructions (Addendum)
We have sent in atrovent nasal spray for the drainage that you can use up to 3 times per day.   Stop taking the potassium pills now that way we can see with the blood work if you need it. I suspect you will not need it.   Work on adding some exercise to your life 2-3 times per week for 20 minutes per time. If you need you can gradually increase by about 5 minutes at a time.   We will have you come to the lab next week sometime before you eat. The lab opens at 7:30 AM and you do not need an appointment.   Exercising to Stay Healthy Exercising regularly is important. It has many health benefits, such as:  Improving your overall fitness, flexibility, and endurance.  Increasing your bone density.  Helping with weight control.  Decreasing your body fat.  Increasing your muscle strength.  Reducing stress and tension.  Improving your overall health. In order to become healthy and stay healthy, it is recommended that you do moderate-intensity and vigorous-intensity exercise. You can tell that you are exercising at a moderate intensity if you have a higher heart rate and faster breathing, but you are still able to hold a conversation. You can tell that you are exercising at a vigorous intensity if you are breathing much harder and faster and cannot hold a conversation while exercising. HOW OFTEN SHOULD I EXERCISE? Choose an activity that you enjoy and set realistic goals. Your health care provider can help you to make an activity plan that works for you. Exercise regularly as directed by your health care provider. This may include:   Doing resistance training twice each week, such as:  Push-ups.  Sit-ups.  Lifting weights.  Using resistance bands.  Doing a given intensity of exercise for a given amount of time. Choose from these options:  150 minutes of moderate-intensity exercise every week.  75 minutes of vigorous-intensity exercise every week.  A mix of moderate-intensity and  vigorous-intensity exercise every week. Children, pregnant women, people who are out of shape, people who are overweight, and older adults may need to consult a health care provider for individual recommendations. If you have any sort of medical condition, be sure to consult your health care provider before starting a new exercise program.  WHAT ARE SOME EXERCISE IDEAS? Some moderate-intensity exercise ideas include:   Walking at a rate of 1 mile in 15 minutes.  Biking.  Hiking.  Golfing.  Dancing. Some vigorous-intensity exercise ideas include:   Walking at a rate of at least 4.5 miles per hour.  Jogging or running at a rate of 5 miles per hour.  Biking at a rate of at least 10 miles per hour.  Lap swimming.  Roller-skating or in-line skating.  Cross-country skiing.  Vigorous competitive sports, such as football, basketball, and soccer.  Jumping rope.  Aerobic dancing. WHAT ARE SOME EVERYDAY ACTIVITIES THAT CAN HELP ME TO GET EXERCISE?  Yard work, such as:  Psychologist, educational.  Raking and bagging leaves.  Washing and waxing your car.  Pushing a stroller.  Shoveling snow.  Gardening.  Washing windows or floors. HOW CAN I BE MORE ACTIVE IN MY DAY-TO-DAY ACTIVITIES?  Use the stairs instead of the elevator.  Take a walk during your lunch break.  If you drive, park your car farther away from work or school.  If you take public transportation, get off one stop early and walk the rest of the way.  Make all of your phone calls while standing up and walking around.  Get up, stretch, and walk around every 30 minutes throughout the day. WHAT GUIDELINES SHOULD I FOLLOW WHILE EXERCISING?  Do not exercise so much that you hurt yourself, feel dizzy, or get very short of breath.  Consult your health care provider before starting a new exercise program.  Wear comfortable clothes and shoes with good support.  Drink plenty of water while you exercise to prevent  dehydration or heat stroke. Body water is lost during exercise and must be replaced.  Work out until you breathe faster and your heart beats faster.   This information is not intended to replace advice given to you by your health care provider. Make sure you discuss any questions you have with your health care provider.   Document Released: 02/24/2010 Document Revised: 02/12/2014 Document Reviewed: 06/25/2013 Elsevier Interactive Patient Education [employer].

## 2015-07-21 NOTE — Progress Notes (Signed)
   Subjective:    Patient ID: Jeffrey Stein, male    DOB: Sep 10, 1945, 70 y.o.   MRN: [id number]  HPI The patient is a new 70 YO man coming in for his liver function. He has had high liver levels for some time off and on statins. He has seen GI at [facility] who did workup and told him that he had fatty liver. He does drink some alcohol and does not describe excessive drinking. No muscle pain while on the statin. He thinks that they checked him for diseases.   PMH, [facility], social history reviewed and updated.   Review of Systems  Constitutional: Negative for fever, activity change, appetite change and fatigue.  HENT: Negative.   Eyes: Negative.   Respiratory: Negative for cough, chest tightness and shortness of breath.   Cardiovascular: Negative for chest pain, palpitations and leg swelling.  Gastrointestinal: Negative for abdominal pain, diarrhea, constipation and abdominal distention.  Musculoskeletal: Positive for arthralgias. Negative for myalgias, back pain and joint swelling.  Skin: Negative.   Neurological: Negative.   Psychiatric/Behavioral: Negative.       Objective:   Physical Exam  Constitutional: He is oriented to person, place, and time. He appears well-developed and well-nourished.  Overweight  HENT:  Head: Normocephalic and atraumatic.  Eyes: EOM are normal.  Neck: Normal range of motion.  Cardiovascular: Normal rate and regular rhythm.   Pulmonary/Chest: Effort normal and breath sounds normal. No respiratory distress. He has no wheezes.  Abdominal: Soft. Bowel sounds are normal. He exhibits no distension. There is no tenderness. There is no rebound.  Musculoskeletal: He exhibits no edema.  Neurological: He is oriented to person, place, and time.  Skin: Skin is warm and dry.  Psychiatric: He has a normal mood and affect.   Filed Vitals:   07/21/15 1403  BP: 130/80  Pulse: 59  Temp: 98.4 F (36.9 C)  TempSrc: Oral  Resp: 22  Height: 5' 11.5" (1.816 m)  Weight: 219  lb (99.338 kg)  SpO2: 97%      Assessment & Plan:

## 2015-07-21 NOTE — Progress Notes (Signed)
Pre visit review using our clinic review tool, if applicable. No additional management support is needed unless otherwise documented below in the visit note. 

## 2015-07-24 NOTE — Assessment & Plan Note (Signed)
Seeing GI in the past and will get those records to ensure he has been screened appropriately for his elevations. We did talk about the need to work on weight loss for fatty liver disease and avoid medications and substances that affect the liver.

## 2015-07-24 NOTE — Assessment & Plan Note (Signed)
Checking lipid panel and adjust as needed. Not on meds right now.

## 2015-07-24 NOTE — Assessment & Plan Note (Signed)
He did have some symptoms while on statin and felt it caused him some memory problems. Checking lipid panel and may be able to try another statin.

## 2015-08-01 ENCOUNTER — Other Ambulatory Visit (INDEPENDENT_AMBULATORY_CARE_PROVIDER_SITE_OTHER): Payer: Medicare HMO

## 2015-08-01 DIAGNOSIS — E785 Hyperlipidemia, unspecified: Secondary | ICD-10-CM | POA: Diagnosis not present

## 2015-08-01 DIAGNOSIS — R7989 Other specified abnormal findings of blood chemistry: Secondary | ICD-10-CM

## 2015-08-01 DIAGNOSIS — R945 Abnormal results of liver function studies: Secondary | ICD-10-CM

## 2015-08-01 LAB — LIPID PANEL
CHOLESTEROL: 226 mg/dL — AB (ref 0–200)
HDL: 42 mg/dL (ref >39.00)
LDL CALC: 160 mg/dL — AB (ref 0–99)
NONHDL: 183.61
Total CHOL/HDL Ratio: 5
Triglycerides: 116 mg/dL (ref 0.0–149.0)
VLDL: 23.2 mg/dL (ref 0.0–40.0)

## 2015-08-01 LAB — COMPREHENSIVE METABOLIC PANEL
ALK PHOS: 44 U/L (ref 39–117)
ALT: 95 U/L — AB (ref 0–53)
AST: 69 U/L — ABNORMAL HIGH (ref 0–37)
Albumin: 4.3 g/dL (ref 3.5–5.2)
BILIRUBIN TOTAL: 0.6 mg/dL (ref 0.2–1.2)
BUN: 17 mg/dL (ref 6–23)
CALCIUM: 9.7 mg/dL (ref 8.4–10.5)
CO2: 27 mEq/L (ref 19–32)
Chloride: 104 mEq/L (ref 96–112)
Creatinine, Ser: 1.14 mg/dL (ref 0.40–1.50)
GFR: 67.59 mL/min (ref >60.00)
Glucose, Bld: 118 mg/dL — ABNORMAL HIGH (ref 70–99)
POTASSIUM: 4 meq/L (ref 3.5–5.1)
Sodium: 140 mEq/L (ref 135–145)
TOTAL PROTEIN: 7.9 g/dL (ref 6.0–8.3)

## 2015-08-01 LAB — CBC
HEMATOCRIT: 39.7 % (ref 39.0–52.0)
Hemoglobin: 13.6 g/dL (ref 13.0–17.0)
MCHC: 34.1 g/dL (ref 30.0–36.0)
MCV: 101.8 fl — ABNORMAL HIGH (ref 78.0–100.0)
Platelets: 197 10*3/uL (ref 150.0–400.0)
RBC: 3.9 Mil/uL — AB (ref 4.22–5.81)
RDW: 14.1 % (ref 11.5–15.5)
WBC: 4.9 10*3/uL (ref 4.0–10.5)

## 2015-08-01 LAB — HEMOGLOBIN A1C: Hgb A1c MFr Bld: 5.8 % (ref 4.6–6.5)

## 2015-08-10 ENCOUNTER — Encounter: Payer: Self-pay | Admitting: Internal Medicine

## 2015-08-25 ENCOUNTER — Other Ambulatory Visit: Payer: Self-pay | Admitting: Cardiology

## 2015-09-15 ENCOUNTER — Telehealth: Payer: Self-pay | Admitting: Cardiology

## 2015-09-15 ENCOUNTER — Other Ambulatory Visit: Payer: Self-pay | Admitting: Cardiology

## 2015-09-15 MED ORDER — CARVEDILOL 6.25 MG PO TABS: 6.2500 mg | ORAL_TABLET | Freq: Two times a day (BID) | ORAL | 3 refills | Status: AC

## 2015-09-15 NOTE — Telephone Encounter (Signed)
Notified the pt that per Dr Meda Coffee, we can try switching him to carvedilol 6.25 mg po BID.  Confirmed the pharmacy of choice with the pt.  Pt request a 90 day supply to be sent to his pharmacy of choice.  Pt verbalized understanding, agrees with this plan, and gracious for all the assistance provided.

## 2015-09-15 NOTE — Telephone Encounter (Signed)
Pt is calling as instructed by multiple pharmacies that Dr Meda Coffee needs to advise on a different regimen for him to take in place of atenolol.  Pt states that there is a Tree surgeon of this drug at all pharmacies and pts are being redirected back to the ordering MD to advise on a different regimen in its place.  Pt states he only has 7 pills left and will be going out of town next week.  Informed the pt that I will route this message to Dr Meda Coffee to review and advise on a different regimen and follow-up with the pt thereafter.  Pt verbalized understanding and agrees with this plan.

## 2015-09-15 NOTE — Telephone Encounter (Signed)
Lets try carvedilol 6.25 mg po BID

## 2015-09-15 NOTE — Telephone Encounter (Signed)
Need refill on Atenolol - was told by [facility] that it is a Short supply on the med.  What can I take now.

## 2015-10-12 DIAGNOSIS — B079 Viral wart, unspecified: Secondary | ICD-10-CM | POA: Diagnosis not present

## 2015-10-12 DIAGNOSIS — D485 Neoplasm of uncertain behavior of skin: Secondary | ICD-10-CM | POA: Diagnosis not present

## 2015-10-28 ENCOUNTER — Encounter: Payer: Self-pay | Admitting: Cardiology

## 2015-10-31 ENCOUNTER — Encounter: Payer: Self-pay | Admitting: Internal Medicine

## 2015-11-14 DIAGNOSIS — Z9889 Other specified postprocedural states: Secondary | ICD-10-CM | POA: Diagnosis not present

## 2015-11-14 DIAGNOSIS — C61 Malignant neoplasm of prostate: Secondary | ICD-10-CM | POA: Diagnosis not present

## 2015-11-14 DIAGNOSIS — M62838 Other muscle spasm: Secondary | ICD-10-CM | POA: Diagnosis not present

## 2015-11-14 DIAGNOSIS — Z9079 Acquired absence of other genital organ(s): Secondary | ICD-10-CM | POA: Diagnosis not present

## 2015-11-14 DIAGNOSIS — Z23 Encounter for immunization: Secondary | ICD-10-CM | POA: Diagnosis not present

## 2015-11-14 DIAGNOSIS — Z7982 Long term (current) use of aspirin: Secondary | ICD-10-CM | POA: Diagnosis not present

## 2015-11-14 DIAGNOSIS — Z880 Allergy status to penicillin: Secondary | ICD-10-CM | POA: Diagnosis not present

## 2015-11-14 DIAGNOSIS — N5234 Erectile dysfunction following simple prostatectomy: Secondary | ICD-10-CM | POA: Diagnosis not present

## 2015-11-15 ENCOUNTER — Encounter: Payer: Self-pay | Admitting: Cardiology

## 2015-11-15 ENCOUNTER — Ambulatory Visit (INDEPENDENT_AMBULATORY_CARE_PROVIDER_SITE_OTHER): Payer: Medicare HMO | Admitting: Cardiology

## 2015-11-15 VITALS — BP 128/68 | HR 79 | Ht 71.5 in | Wt 226.0 lb

## 2015-11-15 DIAGNOSIS — R9431 Abnormal electrocardiogram [ECG] [EKG]: Secondary | ICD-10-CM

## 2015-11-15 DIAGNOSIS — I350 Nonrheumatic aortic (valve) stenosis: Secondary | ICD-10-CM | POA: Diagnosis not present

## 2015-11-15 DIAGNOSIS — R748 Abnormal levels of other serum enzymes: Secondary | ICD-10-CM

## 2015-11-15 DIAGNOSIS — E784 Other hyperlipidemia: Secondary | ICD-10-CM

## 2015-11-15 DIAGNOSIS — R0609 Other forms of dyspnea: Secondary | ICD-10-CM

## 2015-11-15 DIAGNOSIS — E782 Mixed hyperlipidemia: Secondary | ICD-10-CM | POA: Diagnosis not present

## 2015-11-15 DIAGNOSIS — E7849 Other hyperlipidemia: Secondary | ICD-10-CM

## 2015-11-15 MED ORDER — HYDROCHLOROTHIAZIDE 25 MG PO TABS: 25.0000 mg | ORAL_TABLET | Freq: Every day | ORAL | 11 refills | Status: DC

## 2015-11-15 NOTE — Patient Instructions (Signed)
Medication Instructions:   START TAKING HYDROCHLOROTHIAZIDE 25 MG ONCE DAILY    Testing/Procedures:  Your physician has requested that you have an echocardiogram. Echocardiography is a painless test that uses sound waves to create images of your heart. It provides your doctor with information about the size and shape of your heart and how well your heart's chambers and valves are working. This procedure takes approximately one hour. There are no restrictions for this procedure.   Your physician has requested that you have a lexiscan myoview. For further information please visit [email or web address]. Please follow instruction sheet, as given.    You have been referred to [facility] SUPPLE PHARM-D FOR LIPID CLINIC FOR MIXED HYPERLIPIDEMIA   Follow-Up:  Your physician wants you to follow-up in: [place] will receive a reminder letter in the mail two months in advance. If you don't receive a letter, please call our office to schedule the follow-up appointment.   A NEW PATIENT APPOINTMENT WITH [facility] SUPPLE PHARMACIST HERE IN OUR CLINIC FOR ---LIPID CLINIC    If you need a refill on your cardiac medications before your next appointment, please call your pharmacy.

## 2015-11-15 NOTE — Progress Notes (Signed)
Cardiology Office Note    Date:  Nov 24, 2015   ID:  Jeffrey Stein, DOB 08/10/1945, MRN [id number]  PCP:  Hoyt Koch, MD  Cardiologist:   Ena Dawley, MD   Chief complaint: Dyspnea on exertion  History of Present Illness:  Jeffrey Stein is a 70 y.o. male who presents for the evaluation of DOE. The patient with h/o HTN has been experiencing DOE for the last 6 years. Six years ago underwent a treadmill test that was negative for ischemia, and normal PFT. He is retired and minimally active. He states that anytime he tries to walk upstairs he gets SOB very quickly and starts wheezing. It has been getting worse over years, he denies chest pain or resting SOB, no orthopnea or PND. No palpitations. He describes one episode of syncope after sitting in the chair for prolonged time and walking to the bathroom, with immediate recovery of concioussness. He has LE edema that is chronic.  He has no h/o asthma, and has never smoked. His father died of SCD, was smoker.   November 24, 2015 - the patient is coming after one year, he states that his dyspnea on exertion has been stable he gets short of breath after walking flight of stairs but overall with interruptions can complete his tasks and once every couple weeks he plays 18 holes of cold. Denies any chest pressure palpitations or syncope. He has noticed edema around his ankles bilaterally. No orthopnea or paroxysmal nocturnal dyspnea. He hasn't been taking cholesterol medicine as his elevated liver function tests.  Follow pu visit, his SOB improved with omeprazole prescribed by pulmonologist, he doubled the dose and developed rash. No CP, palpitations or syncope.   Past Medical History:  Diagnosis Date  . Abnormal liver enzymes   . Allergic rhinitis   . Allergy   . Colon cancer ([place])   . Colon polyps   . Diverticulitis   . GERD (gastroesophageal reflux disease)   . Heart murmur   . Hemochromatosis   . HTN (hypertension)   . Hyperlipemia    . Meniere's disease   . Prostate cancer [facility])     Past Surgical History:  Procedure Laterality Date  . APPENDECTOMY    . BASAL CELL CARCINOMA EXCISION  2014   behind right knee  . PROSTATECTOMY      Current Medications: Outpatient Medications Prior to Visit  Medication Sig Dispense Refill  . amLODipine (NORVASC) 10 MG tablet Take 10 mg by mouth daily.    Marland Kitchen aspirin EC 81 MG tablet Take 81 mg by mouth daily.    . carvedilol (COREG) 6.25 MG tablet Take 1 tablet (6.25 mg total) by mouth 2 (two) times daily. 180 tablet 3  . fluticasone (FLONASE) 50 MCG/ACT nasal spray Place into the nose.    Marland Kitchen ipratropium (ATROVENT) 0.06 % nasal spray Place 2 sprays into both nostrils 3 (three) times daily. 15 mL 12  . losartan (COZAAR) 100 MG tablet Take 100 mg by mouth daily.     Marland Kitchen omeprazole (PRILOSEC) 40 MG capsule Take 1 capsule by mouth daily.    . vitamin E 400 UNIT capsule Take 2 capsules by mouth daily.     No facility-administered medications prior to visit.      Allergies:   Penicillin g; Atorvastatin; Iodinated diagnostic agents; Omeprazole; and Shellfish allergy   Social History   Social History  . Marital status: Married    Spouse name: N/A  . Number of children: N/A  . Years of education:  N/A   Social History Main Topics  . Smoking status: Never Smoker  . Smokeless tobacco: Never Used  . Alcohol use 0.0 oz/week  . Drug use: No  . Sexual activity: Not Asked   Other Topics Concern  . None   Social History Narrative  . None     Family History:  The patient's family history includes Colon cancer in his mother; Emphysema in his father.   ROS:   Please see the history of present illness.    ROS All other systems reviewed and are negative.   PHYSICAL EXAM:   VS:  BP 128/68   Pulse 79   Ht 5' 11.5" (1.816 m)   Wt 226 lb (102.5 kg)   BMI 31.08 kg/m    GEN: Well nourished, well developed, in no acute distress  HEENT: normal  Neck: no JVD, carotid bruits, or  masses Cardiac: RRR; 4/6 holosystolic murmurs, rubs, or gallops,no edema  Respiratory:  clear to auscultation bilaterally, normal work of breathing GI: soft, nontender, nondistended, + BS MS: no deformity or atrophy  Skin: warm and dry, no rash Neuro:  Alert and Oriented x 3, Strength and sensation are intact Psych: euthymic mood, full affect  Wt Readings from Last 3 Encounters:  11/15/15 226 lb (102.5 kg)  07/21/15 219 lb (99.3 kg)  09/24/14 221 lb (100.2 kg)     Studies/Labs Reviewed:   EKG:  EKG is ordered today.  The ekg ordered today demonstrates Normal sinus rhythm, LVH, inferior infarct, age undetermined, new negative T waves in the inferolateral leads that are new this is abnormal EKG.  Recent Labs: 01/03/2015: TSH 1.640 08/01/2015: ALT 95; BUN 17; Creatinine, Ser 1.14; Hemoglobin 13.6; Platelets 197.0; Potassium 4.0; Sodium 140   Lipid Panel    Component Value Date/Time   CHOL 226 (H) 08/01/2015 0742   CHOL 255 (H) 09/24/2014 1103   TRIG 116.0 08/01/2015 0742   TRIG 139 09/24/2014 1103   HDL 42.00 08/01/2015 0742   HDL 40 09/24/2014 1103   CHOLHDL 5 08/01/2015 0742   VLDL 23.2 08/01/2015 0742   LDLCALC 160 (H) 08/01/2015 0742   LDLCALC 187 (H) 09/24/2014 1103    Additional studies/ records that were reviewed today include:      ASSESSMENT:    1. Other hyperlipidemia   2. DOE (dyspnea on exertion)   3. Mixed hyperlipidemia   4. Aortic valve stenosis, etiology of cardiac valve disease unspecified   5. Abnormal EKG   6. Nonrheumatic aortic valve stenosis   7. Elevated liver enzymes      PLAN:  In order of problems listed above:  1.  DOE - Now with significant new EKG changes suggestive of inferolateral ischemia. The patient states that he doesn't feel different however his EKG is concerning and he does have symptoms. We'll schedule Ubaldo Glassing can nuclear stress test to evaluate for ischemia.  2. Mild AS - on last year echo, his murmur appears to be worse,  we will repeat echocardiogram..   3. LE edema -start hydrocortisone 25 mg by mouth daily.  4. HTN - controlled on current regimen.  5. Hyperlipidemia with elevated LFTs, and intolerance to atorvastatin with new EKG changes and known aortic stenosis, will refer to lipid clinic for consideration of DC skin 9 inhibitors.  Labs/ tests ordered today include:  Orders Placed This Encounter  Procedures  . EKG 12-Lead  . Echocardiogram stress test without contrast  . 2D Echocardiogram without contrast   Disposition:   FU  with Ena Dawley in 3 months.  Signed, Ena Dawley, MD  11/15/2015 9:41 AM    [address], [place], [place]  [address] Phone: [phone]; Fax: [phone]   d Tests ordered today are listed in the Patient Instructions below. Patient Instructions  Medication Instructions:   START TAKING HYDROCHLOROTHIAZIDE 25 MG ONCE DAILY    Testing/Procedures:  Your physician has requested that you have an echocardiogram. Echocardiography is a painless test that uses sound waves to create images of your heart. It provides your doctor with information about the size and shape of your heart and how well your heart's chambers and valves are working. This procedure takes approximately one hour. There are no restrictions for this procedure.   Your physician has requested that you have a lexiscan myoview. For further information please visit [email or web address]. Please follow instruction sheet, as given.    You have been referred to [facility] SUPPLE PHARM-D FOR LIPID CLINIC FOR MIXED HYPERLIPIDEMIA   Follow-Up:  Your physician wants you to follow-up in: [place] will receive a reminder letter in the mail two months in advance. If you don't receive a letter, please call our office to schedule the follow-up appointment.   A NEW PATIENT APPOINTMENT WITH [facility] SUPPLE PHARMACIST HERE IN OUR CLINIC FOR ---LIPID  CLINIC    If you need a refill on your cardiac medications before your next appointment, please call your pharmacy.      Signed, Ena Dawley, MD  11/15/2015 9:41 AM    [address], [place], [place]  [address] Phone: [phone]; Fax: [phone]

## 2015-11-23 ENCOUNTER — Ambulatory Visit: Payer: Medicare HMO

## 2015-11-23 ENCOUNTER — Ambulatory Visit (INDEPENDENT_AMBULATORY_CARE_PROVIDER_SITE_OTHER): Payer: Medicare HMO | Admitting: Pharmacist

## 2015-11-23 DIAGNOSIS — E782 Mixed hyperlipidemia: Secondary | ICD-10-CM

## 2015-11-23 MED ORDER — ROSUVASTATIN CALCIUM 10 MG PO TABS: ORAL_TABLET | ORAL | 6 refills | Status: DC

## 2015-11-23 NOTE — Progress Notes (Signed)
Patient ID: Jeffrey Stein                 DOB: [birth year]                    MRN: [id number]     HPI: Jeffrey Stein is a 70 y.o. male patient referred to lipid clinic by Dr. Meda Stein - last visit 11/15/15. PMH significant for HTN, HLD, GERD, aortic stenosis, stable hepatic steatosis w/ stable but elevated LFTs, and DOE w/ abnormal EKG - nuclear stress test pending. LDL was at goal while on atorvastatin 20mg  daily. States he has previously been on simvastatin as well and was unable to tolerate due to myalgias. Pt wonders if Coq-10 may help tolerance.   Current Medications: None Intolerances: Atorvastatin 20mg  daily (myalgias), simvastatin (myalgias) Risk Factors: HTN, LDL >130, pending nuclear stress test LDL goal: <130 for now - may be lower in the future pending results of NST  Exercise: Minimally active  Family History: Colon cancer - mother; emphysema - father  Social History: Never smoker  Labs: Lipid panel 08/01/15: TC 226, TG 116, HDL 42, LDL 160 (no lipid lowering therapy) 08/01/15 LFTs: AST 69; ALT 95 (stable on no statin medication - chronically elevated due to hepatic steatosis)  Past Medical History:  Diagnosis Date  . Abnormal liver enzymes   . Allergic rhinitis   . Allergy   . Colon cancer ([place])   . Colon polyps   . Diverticulitis   . GERD (gastroesophageal reflux disease)   . Heart murmur   . Hemochromatosis   . HTN (hypertension)   . Hyperlipemia   . Meniere's disease   . Prostate cancer [facility])     Current Outpatient Prescriptions on File Prior to Visit  Medication Sig Dispense Refill  . amLODipine (NORVASC) 10 MG tablet Take 10 mg by mouth daily.    Marland Kitchen aspirin EC 81 MG tablet Take 81 mg by mouth daily.    . carvedilol (COREG) 6.25 MG tablet Take 1 tablet (6.25 mg total) by mouth 2 (two) times daily. 180 tablet 3  . fluticasone (FLONASE) 50 MCG/ACT nasal spray Place into the nose.    . hydrochlorothiazide (HYDRODIURIL) 25 MG tablet Take 1 tablet (25 mg total)  by mouth daily. 90 tablet 11  . ipratropium (ATROVENT) 0.06 % nasal spray Place 2 sprays into both nostrils 3 (three) times daily. 15 mL 12  . losartan (COZAAR) 100 MG tablet Take 100 mg by mouth daily.     Marland Kitchen omeprazole (PRILOSEC) 40 MG capsule Take 1 capsule by mouth daily.    . vitamin E 400 UNIT capsule Take 2 capsules by mouth daily.     No current facility-administered medications on file prior to visit.     Allergies  Allergen Reactions  . Penicillin G Hives  . Atorvastatin Other (See Comments)    Causes muscle aches/cramps and tremors in fingers  . Iodinated Diagnostic Agents Hives    Years ago, 1 small hive on neck  . Omeprazole Rash    In high doses   . Shellfish Allergy Nausea Only, Rash and Other (See Comments)    Depends on the type of shellfish    Assessment/Plan:  1. Hyperlipidemia - LDL currently above goal of <130 mg/dL for primary prevention. Future goal may be lower pending results of nuclear stress test. Explained in detail the pros and cons of PCSK-9 inhibitors and statins in regard to cost and benefit in primary/secondary prevention. May be difficult to  get PCSK9 approval without documented CAD. Unfortunately, aortic stenosis does not qualify patient for PCSK9i therapy. Noted pt has elevated LFTs currently less than 3x ULN due to hepatic steatosis. However, this has been stable and is not a contraindication to statin therapy ([facility] Clin Proc. 2010;85(4):349-356). After reviewing data with pt, he is agreeable to start rosuvastatin 10mg  three times weekly. Instructed pt to increase dose by an additional tablet every two weeks as tolerated. Pt states he may try CoQ-10 if tolerance issues arise at a dose of 200mg  daily. Will order LFTs in one month to ensure no further increases. Counseled on proper diet to further reduce lipids and provided written material. F/U clinic visit with LFTs/lipid panel in 3 months.   Jeffrey Stein, PharmD Clinical Pharmacist 9:56 AM,  11/23/2015

## 2015-11-23 NOTE — Patient Instructions (Signed)
Start taking rosuvastatin (Crestor) 10mg  three times weekly. Increase dose by one tablet every two weeks as tolerated.   If you begin to develop any adverse effects while increasing your dose, reduce back to the last previously tolerated dose   If unable to tolerate three times weekly, you may stop taking the rosuvastatin.  Return for liver panel in one month.  If you have any questions or concerns please call the coumadin clinic [phone]

## 2015-11-29 ENCOUNTER — Telehealth (HOSPITAL_COMMUNITY): Payer: Self-pay | Admitting: *Deleted

## 2015-11-29 NOTE — Telephone Encounter (Signed)
Patient given detailed instructions per Myocardial Perfusion Study Information Sheet for the test on 12/01/15 at 0745. Patient notified to arrive 15 minutes early and that it is imperative to arrive on time for appointment to keep from having the test rescheduled.  If you need to cancel or reschedule your appointment, please call the office within 24 hours of your appointment. Failure to do so may result in a cancellation of your appointment, and a $50 no show fee. Patient verbalized understanding.Ronny Korff, [place]e [place]

## 2015-12-01 ENCOUNTER — Other Ambulatory Visit: Payer: Self-pay

## 2015-12-01 ENCOUNTER — Ambulatory Visit (HOSPITAL_BASED_OUTPATIENT_CLINIC_OR_DEPARTMENT_OTHER): Payer: Medicare HMO

## 2015-12-01 ENCOUNTER — Ambulatory Visit (HOSPITAL_COMMUNITY): Payer: Medicare HMO | Attending: Cardiology

## 2015-12-01 DIAGNOSIS — I071 Rheumatic tricuspid insufficiency: Secondary | ICD-10-CM | POA: Diagnosis not present

## 2015-12-01 DIAGNOSIS — E782 Mixed hyperlipidemia: Secondary | ICD-10-CM

## 2015-12-01 DIAGNOSIS — I350 Nonrheumatic aortic (valve) stenosis: Secondary | ICD-10-CM | POA: Diagnosis not present

## 2015-12-01 DIAGNOSIS — E784 Other hyperlipidemia: Secondary | ICD-10-CM

## 2015-12-01 DIAGNOSIS — I059 Rheumatic mitral valve disease, unspecified: Secondary | ICD-10-CM | POA: Insufficient documentation

## 2015-12-01 DIAGNOSIS — R9431 Abnormal electrocardiogram [ECG] [EKG]: Secondary | ICD-10-CM

## 2015-12-01 DIAGNOSIS — R0609 Other forms of dyspnea: Secondary | ICD-10-CM

## 2015-12-01 DIAGNOSIS — I119 Hypertensive heart disease without heart failure: Secondary | ICD-10-CM | POA: Diagnosis not present

## 2015-12-01 DIAGNOSIS — R9439 Abnormal result of other cardiovascular function study: Secondary | ICD-10-CM | POA: Diagnosis not present

## 2015-12-01 DIAGNOSIS — E7849 Other hyperlipidemia: Secondary | ICD-10-CM

## 2015-12-01 DIAGNOSIS — I77810 Thoracic aortic ectasia: Secondary | ICD-10-CM | POA: Insufficient documentation

## 2015-12-01 LAB — MYOCARDIAL PERFUSION IMAGING
LV dias vol: 119 mL (ref 62–150)
LV sys vol: 67 mL
Peak HR: 101 {beats}/min
RATE: 0.28
Rest HR: 82 {beats}/min
SDS: 8
SRS: 8
SSS: 15
TID: 0.95

## 2015-12-01 MED ORDER — TECHNETIUM TC 99M TETROFOSMIN IV KIT
32.1000 | PACK | Freq: Once | INTRAVENOUS | Status: AC | PRN
  Administered 2015-12-01: 32.1 via INTRAVENOUS
  Filled 2015-12-01: qty 33

## 2015-12-01 MED ORDER — TECHNETIUM TC 99M TETROFOSMIN IV KIT
10.8000 | PACK | Freq: Once | INTRAVENOUS | Status: AC | PRN
  Administered 2015-12-01: 10.8 via INTRAVENOUS
  Filled 2015-12-01: qty 11

## 2015-12-01 MED ORDER — REGADENOSON 0.4 MG/5ML IV SOLN
0.4000 mg | Freq: Once | INTRAVENOUS | Status: AC
  Administered 2015-12-01: 0.4 mg via INTRAVENOUS

## 2015-12-02 ENCOUNTER — Telehealth: Payer: Self-pay | Admitting: Internal Medicine

## 2015-12-02 ENCOUNTER — Other Ambulatory Visit: Payer: Self-pay | Admitting: Geriatric Medicine

## 2015-12-02 MED ORDER — LOSARTAN POTASSIUM 100 MG PO TABS: 100.0000 mg | ORAL_TABLET | Freq: Every day | ORAL | 3 refills | Status: DC

## 2015-12-02 NOTE — Telephone Encounter (Signed)
Patient is requesting refill of losartan (COZAAR) 100 MG tablet [id number]  [facility] on file.

## 2015-12-02 NOTE — Telephone Encounter (Signed)
Sent to pharmacy 

## 2015-12-06 DIAGNOSIS — I10 Essential (primary) hypertension: Secondary | ICD-10-CM | POA: Diagnosis not present

## 2015-12-06 DIAGNOSIS — E7800 Pure hypercholesterolemia, unspecified: Secondary | ICD-10-CM | POA: Diagnosis not present

## 2015-12-06 DIAGNOSIS — I2510 Atherosclerotic heart disease of native coronary artery without angina pectoris: Secondary | ICD-10-CM | POA: Diagnosis not present

## 2015-12-07 DIAGNOSIS — I25118 Atherosclerotic heart disease of native coronary artery with other forms of angina pectoris: Secondary | ICD-10-CM | POA: Diagnosis not present

## 2015-12-07 DIAGNOSIS — Z951 Presence of aortocoronary bypass graft: Secondary | ICD-10-CM | POA: Diagnosis not present

## 2015-12-07 DIAGNOSIS — R9430 Abnormal result of cardiovascular function study, unspecified: Secondary | ICD-10-CM | POA: Diagnosis not present

## 2015-12-07 DIAGNOSIS — I10 Essential (primary) hypertension: Secondary | ICD-10-CM | POA: Diagnosis not present

## 2015-12-07 DIAGNOSIS — R001 Bradycardia, unspecified: Secondary | ICD-10-CM | POA: Diagnosis not present

## 2015-12-07 DIAGNOSIS — I25110 Atherosclerotic heart disease of native coronary artery with unstable angina pectoris: Secondary | ICD-10-CM | POA: Diagnosis not present

## 2015-12-07 DIAGNOSIS — I350 Nonrheumatic aortic (valve) stenosis: Secondary | ICD-10-CM | POA: Diagnosis not present

## 2015-12-07 DIAGNOSIS — E785 Hyperlipidemia, unspecified: Secondary | ICD-10-CM | POA: Diagnosis not present

## 2015-12-07 DIAGNOSIS — Z8249 Family history of ischemic heart disease and other diseases of the circulatory system: Secondary | ICD-10-CM | POA: Diagnosis not present

## 2015-12-07 DIAGNOSIS — E7800 Pure hypercholesterolemia, unspecified: Secondary | ICD-10-CM | POA: Diagnosis not present

## 2015-12-07 HISTORY — PX: OTHER SURGICAL HISTORY: SHX169

## 2015-12-14 ENCOUNTER — Other Ambulatory Visit: Payer: Medicare HMO

## 2015-12-14 DIAGNOSIS — E782 Mixed hyperlipidemia: Secondary | ICD-10-CM | POA: Diagnosis not present

## 2015-12-14 DIAGNOSIS — I10 Essential (primary) hypertension: Secondary | ICD-10-CM | POA: Diagnosis not present

## 2015-12-14 DIAGNOSIS — I2510 Atherosclerotic heart disease of native coronary artery without angina pectoris: Secondary | ICD-10-CM | POA: Diagnosis not present

## 2015-12-14 DIAGNOSIS — R918 Other nonspecific abnormal finding of lung field: Secondary | ICD-10-CM | POA: Diagnosis not present

## 2015-12-14 DIAGNOSIS — I2583 Coronary atherosclerosis due to lipid rich plaque: Secondary | ICD-10-CM | POA: Diagnosis not present

## 2015-12-14 DIAGNOSIS — R0609 Other forms of dyspnea: Secondary | ICD-10-CM | POA: Diagnosis not present

## 2015-12-14 DIAGNOSIS — I350 Nonrheumatic aortic (valve) stenosis: Secondary | ICD-10-CM | POA: Diagnosis not present

## 2015-12-14 DIAGNOSIS — I739 Peripheral vascular disease, unspecified: Secondary | ICD-10-CM | POA: Diagnosis not present

## 2015-12-15 DIAGNOSIS — I2510 Atherosclerotic heart disease of native coronary artery without angina pectoris: Secondary | ICD-10-CM | POA: Diagnosis not present

## 2015-12-15 DIAGNOSIS — J9811 Atelectasis: Secondary | ICD-10-CM | POA: Diagnosis not present

## 2015-12-15 DIAGNOSIS — Z0389 Encounter for observation for other suspected diseases and conditions ruled out: Secondary | ICD-10-CM | POA: Diagnosis not present

## 2015-12-15 DIAGNOSIS — I240 Acute coronary thrombosis not resulting in myocardial infarction: Secondary | ICD-10-CM | POA: Diagnosis not present

## 2015-12-15 DIAGNOSIS — Z951 Presence of aortocoronary bypass graft: Secondary | ICD-10-CM | POA: Diagnosis not present

## 2015-12-15 DIAGNOSIS — Z4682 Encounter for fitting and adjustment of non-vascular catheter: Secondary | ICD-10-CM | POA: Diagnosis not present

## 2015-12-15 DIAGNOSIS — J95811 Postprocedural pneumothorax: Secondary | ICD-10-CM | POA: Diagnosis not present

## 2015-12-15 DIAGNOSIS — J90 Pleural effusion, not elsewhere classified: Secondary | ICD-10-CM | POA: Diagnosis not present

## 2015-12-15 DIAGNOSIS — I25119 Atherosclerotic heart disease of native coronary artery with unspecified angina pectoris: Secondary | ICD-10-CM | POA: Diagnosis not present

## 2015-12-15 DIAGNOSIS — Z9889 Other specified postprocedural states: Secondary | ICD-10-CM | POA: Diagnosis not present

## 2015-12-15 DIAGNOSIS — I700 Atherosclerosis of aorta: Secondary | ICD-10-CM | POA: Diagnosis not present

## 2015-12-15 DIAGNOSIS — R918 Other nonspecific abnormal finding of lung field: Secondary | ICD-10-CM | POA: Diagnosis not present

## 2015-12-26 ENCOUNTER — Telehealth (HOSPITAL_COMMUNITY): Payer: Self-pay | Admitting: Cardiac Rehabilitation

## 2015-12-26 NOTE — Telephone Encounter (Signed)
pc to pt to discuss enrolling in cardiac rehab. Lm on voicemail.

## 2016-01-03 ENCOUNTER — Telehealth (HOSPITAL_COMMUNITY): Payer: Self-pay | Admitting: Cardiac Rehabilitation

## 2016-01-03 NOTE — Telephone Encounter (Signed)
pc to pt to discuss enrolling in cardiac rehab. LM on AM.

## 2016-01-11 DIAGNOSIS — Z7982 Long term (current) use of aspirin: Secondary | ICD-10-CM | POA: Diagnosis not present

## 2016-01-11 DIAGNOSIS — Z9889 Other specified postprocedural states: Secondary | ICD-10-CM | POA: Diagnosis not present

## 2016-01-11 DIAGNOSIS — Z880 Allergy status to penicillin: Secondary | ICD-10-CM | POA: Diagnosis not present

## 2016-01-11 DIAGNOSIS — Z48812 Encounter for surgical aftercare following surgery on the circulatory system: Secondary | ICD-10-CM | POA: Diagnosis not present

## 2016-01-11 DIAGNOSIS — Z8679 Personal history of other diseases of the circulatory system: Secondary | ICD-10-CM | POA: Diagnosis not present

## 2016-01-11 DIAGNOSIS — I4581 Long QT syndrome: Secondary | ICD-10-CM | POA: Diagnosis not present

## 2016-01-11 DIAGNOSIS — Z951 Presence of aortocoronary bypass graft: Secondary | ICD-10-CM | POA: Diagnosis not present

## 2016-01-11 DIAGNOSIS — R9431 Abnormal electrocardiogram [ECG] [EKG]: Secondary | ICD-10-CM | POA: Diagnosis not present

## 2016-01-11 DIAGNOSIS — R918 Other nonspecific abnormal finding of lung field: Secondary | ICD-10-CM | POA: Diagnosis not present

## 2016-01-18 DIAGNOSIS — H04123 Dry eye syndrome of bilateral lacrimal glands: Secondary | ICD-10-CM | POA: Diagnosis not present

## 2016-01-18 DIAGNOSIS — H5213 Myopia, bilateral: Secondary | ICD-10-CM | POA: Diagnosis not present

## 2016-01-18 DIAGNOSIS — H35372 Puckering of macula, left eye: Secondary | ICD-10-CM | POA: Diagnosis not present

## 2016-01-18 DIAGNOSIS — Z961 Presence of intraocular lens: Secondary | ICD-10-CM | POA: Diagnosis not present

## 2016-01-18 DIAGNOSIS — H52223 Regular astigmatism, bilateral: Secondary | ICD-10-CM | POA: Diagnosis not present

## 2016-01-18 DIAGNOSIS — H40023 Open angle with borderline findings, high risk, bilateral: Secondary | ICD-10-CM | POA: Diagnosis not present

## 2016-01-18 DIAGNOSIS — H43392 Other vitreous opacities, left eye: Secondary | ICD-10-CM | POA: Diagnosis not present

## 2016-01-20 DIAGNOSIS — I10 Essential (primary) hypertension: Secondary | ICD-10-CM | POA: Diagnosis not present

## 2016-01-20 DIAGNOSIS — E7800 Pure hypercholesterolemia, unspecified: Secondary | ICD-10-CM | POA: Diagnosis not present

## 2016-01-20 DIAGNOSIS — I2583 Coronary atherosclerosis due to lipid rich plaque: Secondary | ICD-10-CM | POA: Diagnosis not present

## 2016-01-20 DIAGNOSIS — I350 Nonrheumatic aortic (valve) stenosis: Secondary | ICD-10-CM | POA: Diagnosis not present

## 2016-01-20 DIAGNOSIS — I9789 Other postprocedural complications and disorders of the circulatory system, not elsewhere classified: Secondary | ICD-10-CM | POA: Diagnosis not present

## 2016-01-20 DIAGNOSIS — I4891 Unspecified atrial fibrillation: Secondary | ICD-10-CM | POA: Diagnosis not present

## 2016-01-20 DIAGNOSIS — I2510 Atherosclerotic heart disease of native coronary artery without angina pectoris: Secondary | ICD-10-CM | POA: Diagnosis not present

## 2016-02-20 ENCOUNTER — Telehealth (HOSPITAL_COMMUNITY): Payer: Self-pay | Admitting: Cardiac Rehabilitation

## 2016-02-20 ENCOUNTER — Telehealth (HOSPITAL_COMMUNITY): Payer: Self-pay | Admitting: Internal Medicine

## 2016-02-20 NOTE — Telephone Encounter (Signed)
pc to pt to discuss enrolling in cardiac rehab. Left message on machine

## 2016-02-20 NOTE — Telephone Encounter (Signed)
S/w Dorothea Ogle with [facility], $45 co-pay, $0 Deductible $4,500 Out of Pocket, no limit of visits.  Reference # [id number]... KJ

## 2016-02-21 ENCOUNTER — Telehealth (HOSPITAL_COMMUNITY): Payer: Self-pay | Admitting: Cardiac Rehabilitation

## 2016-02-21 ENCOUNTER — Other Ambulatory Visit: Payer: Medicare HMO

## 2016-02-21 NOTE — Telephone Encounter (Signed)
Cardiac rehab order received.  Phone to pt to discuss enrolling in cardiac rehab.  Pt declined. He plans to participate at [facility].

## 2016-02-22 ENCOUNTER — Ambulatory Visit: Payer: Medicare HMO

## 2016-03-06 DIAGNOSIS — Z951 Presence of aortocoronary bypass graft: Secondary | ICD-10-CM | POA: Diagnosis not present

## 2016-03-07 DIAGNOSIS — Z951 Presence of aortocoronary bypass graft: Secondary | ICD-10-CM | POA: Diagnosis not present

## 2016-03-08 DIAGNOSIS — Z951 Presence of aortocoronary bypass graft: Secondary | ICD-10-CM | POA: Diagnosis not present

## 2016-03-12 DIAGNOSIS — Z951 Presence of aortocoronary bypass graft: Secondary | ICD-10-CM | POA: Diagnosis not present

## 2016-03-14 DIAGNOSIS — Z951 Presence of aortocoronary bypass graft: Secondary | ICD-10-CM | POA: Diagnosis not present

## 2016-03-15 DIAGNOSIS — Z951 Presence of aortocoronary bypass graft: Secondary | ICD-10-CM | POA: Diagnosis not present

## 2016-03-19 DIAGNOSIS — Z951 Presence of aortocoronary bypass graft: Secondary | ICD-10-CM | POA: Diagnosis not present

## 2016-03-19 DIAGNOSIS — R69 Illness, unspecified: Secondary | ICD-10-CM | POA: Diagnosis not present

## 2016-03-21 DIAGNOSIS — Z951 Presence of aortocoronary bypass graft: Secondary | ICD-10-CM | POA: Diagnosis not present

## 2016-03-22 DIAGNOSIS — Z951 Presence of aortocoronary bypass graft: Secondary | ICD-10-CM | POA: Diagnosis not present

## 2016-03-26 DIAGNOSIS — Z951 Presence of aortocoronary bypass graft: Secondary | ICD-10-CM | POA: Diagnosis not present

## 2016-03-28 DIAGNOSIS — Z951 Presence of aortocoronary bypass graft: Secondary | ICD-10-CM | POA: Diagnosis not present

## 2016-03-29 DIAGNOSIS — Z951 Presence of aortocoronary bypass graft: Secondary | ICD-10-CM | POA: Diagnosis not present

## 2016-04-02 DIAGNOSIS — Z951 Presence of aortocoronary bypass graft: Secondary | ICD-10-CM | POA: Diagnosis not present

## 2016-04-04 DIAGNOSIS — Z951 Presence of aortocoronary bypass graft: Secondary | ICD-10-CM | POA: Diagnosis not present

## 2016-04-05 DIAGNOSIS — Z951 Presence of aortocoronary bypass graft: Secondary | ICD-10-CM | POA: Diagnosis not present

## 2016-04-09 DIAGNOSIS — Z951 Presence of aortocoronary bypass graft: Secondary | ICD-10-CM | POA: Diagnosis not present

## 2016-04-11 DIAGNOSIS — Z951 Presence of aortocoronary bypass graft: Secondary | ICD-10-CM | POA: Diagnosis not present

## 2016-04-12 DIAGNOSIS — Z951 Presence of aortocoronary bypass graft: Secondary | ICD-10-CM | POA: Diagnosis not present

## 2016-04-18 DIAGNOSIS — Z951 Presence of aortocoronary bypass graft: Secondary | ICD-10-CM | POA: Diagnosis not present

## 2016-04-19 DIAGNOSIS — Z951 Presence of aortocoronary bypass graft: Secondary | ICD-10-CM | POA: Diagnosis not present

## 2016-04-23 DIAGNOSIS — Z951 Presence of aortocoronary bypass graft: Secondary | ICD-10-CM | POA: Diagnosis not present

## 2016-04-24 DIAGNOSIS — H3322 Serous retinal detachment, left eye: Secondary | ICD-10-CM | POA: Diagnosis not present

## 2016-04-24 DIAGNOSIS — H43392 Other vitreous opacities, left eye: Secondary | ICD-10-CM | POA: Diagnosis not present

## 2016-04-24 DIAGNOSIS — H33002 Unspecified retinal detachment with retinal break, left eye: Secondary | ICD-10-CM | POA: Diagnosis not present

## 2016-04-24 DIAGNOSIS — Z961 Presence of intraocular lens: Secondary | ICD-10-CM | POA: Diagnosis not present

## 2016-04-24 DIAGNOSIS — H5213 Myopia, bilateral: Secondary | ICD-10-CM | POA: Diagnosis not present

## 2016-04-24 DIAGNOSIS — H40023 Open angle with borderline findings, high risk, bilateral: Secondary | ICD-10-CM | POA: Diagnosis not present

## 2016-04-24 DIAGNOSIS — H52223 Regular astigmatism, bilateral: Secondary | ICD-10-CM | POA: Diagnosis not present

## 2016-04-24 DIAGNOSIS — H35372 Puckering of macula, left eye: Secondary | ICD-10-CM | POA: Diagnosis not present

## 2016-04-25 DIAGNOSIS — Z961 Presence of intraocular lens: Secondary | ICD-10-CM | POA: Diagnosis not present

## 2016-04-25 DIAGNOSIS — H33321 Round hole, right eye: Secondary | ICD-10-CM | POA: Diagnosis not present

## 2016-04-25 DIAGNOSIS — E782 Mixed hyperlipidemia: Secondary | ICD-10-CM | POA: Diagnosis not present

## 2016-04-25 DIAGNOSIS — I10 Essential (primary) hypertension: Secondary | ICD-10-CM | POA: Diagnosis not present

## 2016-04-25 DIAGNOSIS — H43813 Vitreous degeneration, bilateral: Secondary | ICD-10-CM | POA: Diagnosis not present

## 2016-04-25 DIAGNOSIS — Z951 Presence of aortocoronary bypass graft: Secondary | ICD-10-CM | POA: Diagnosis not present

## 2016-04-25 DIAGNOSIS — H33022 Retinal detachment with multiple breaks, left eye: Secondary | ICD-10-CM | POA: Diagnosis not present

## 2016-04-25 DIAGNOSIS — I2510 Atherosclerotic heart disease of native coronary artery without angina pectoris: Secondary | ICD-10-CM | POA: Diagnosis not present

## 2016-04-25 DIAGNOSIS — H33311 Horseshoe tear of retina without detachment, right eye: Secondary | ICD-10-CM | POA: Diagnosis not present

## 2016-04-25 DIAGNOSIS — I2583 Coronary atherosclerosis due to lipid rich plaque: Secondary | ICD-10-CM | POA: Diagnosis not present

## 2016-04-25 DIAGNOSIS — H40003 Preglaucoma, unspecified, bilateral: Secondary | ICD-10-CM | POA: Diagnosis not present

## 2016-04-25 DIAGNOSIS — H3589 Other specified retinal disorders: Secondary | ICD-10-CM | POA: Diagnosis not present

## 2016-05-01 DIAGNOSIS — M109 Gout, unspecified: Secondary | ICD-10-CM | POA: Diagnosis not present

## 2016-05-14 DIAGNOSIS — C61 Malignant neoplasm of prostate: Secondary | ICD-10-CM | POA: Diagnosis not present

## 2016-05-21 DIAGNOSIS — I712 Thoracic aortic aneurysm, without rupture: Secondary | ICD-10-CM | POA: Diagnosis not present

## 2016-05-21 DIAGNOSIS — E782 Mixed hyperlipidemia: Secondary | ICD-10-CM | POA: Diagnosis not present

## 2016-05-21 DIAGNOSIS — I2583 Coronary atherosclerosis due to lipid rich plaque: Secondary | ICD-10-CM | POA: Diagnosis not present

## 2016-05-21 DIAGNOSIS — I10 Essential (primary) hypertension: Secondary | ICD-10-CM | POA: Diagnosis not present

## 2016-05-21 DIAGNOSIS — I350 Nonrheumatic aortic (valve) stenosis: Secondary | ICD-10-CM | POA: Diagnosis not present

## 2016-05-21 DIAGNOSIS — I2510 Atherosclerotic heart disease of native coronary artery without angina pectoris: Secondary | ICD-10-CM | POA: Diagnosis not present

## 2016-05-21 DIAGNOSIS — R0609 Other forms of dyspnea: Secondary | ICD-10-CM | POA: Diagnosis not present

## 2016-05-22 DIAGNOSIS — R69 Illness, unspecified: Secondary | ICD-10-CM | POA: Diagnosis not present

## 2016-05-31 DIAGNOSIS — H33022 Retinal detachment with multiple breaks, left eye: Secondary | ICD-10-CM | POA: Diagnosis not present

## 2016-06-18 DIAGNOSIS — R0609 Other forms of dyspnea: Secondary | ICD-10-CM | POA: Diagnosis not present

## 2016-06-18 DIAGNOSIS — I350 Nonrheumatic aortic (valve) stenosis: Secondary | ICD-10-CM | POA: Diagnosis not present

## 2016-06-18 DIAGNOSIS — I10 Essential (primary) hypertension: Secondary | ICD-10-CM | POA: Diagnosis not present

## 2016-06-18 DIAGNOSIS — I2583 Coronary atherosclerosis due to lipid rich plaque: Secondary | ICD-10-CM | POA: Diagnosis not present

## 2016-06-18 DIAGNOSIS — E782 Mixed hyperlipidemia: Secondary | ICD-10-CM | POA: Diagnosis not present

## 2016-06-18 DIAGNOSIS — I712 Thoracic aortic aneurysm, without rupture: Secondary | ICD-10-CM | POA: Diagnosis not present

## 2016-06-18 DIAGNOSIS — I2510 Atherosclerotic heart disease of native coronary artery without angina pectoris: Secondary | ICD-10-CM | POA: Diagnosis not present

## 2016-07-26 ENCOUNTER — Telehealth: Payer: Self-pay

## 2016-07-26 NOTE — Telephone Encounter (Signed)
Patient is on the list for [employer] 2018 and may be a good candidate for an AWV. Please let me know if/when appt is scheduled.   

## 2016-07-27 NOTE — Telephone Encounter (Signed)
Called pt to schedule awv. Lvm for pt to call office to schedule appt.  °

## 2016-08-21 DIAGNOSIS — I712 Thoracic aortic aneurysm, without rupture: Secondary | ICD-10-CM | POA: Diagnosis not present

## 2016-08-21 DIAGNOSIS — E782 Mixed hyperlipidemia: Secondary | ICD-10-CM | POA: Diagnosis not present

## 2016-08-21 DIAGNOSIS — I350 Nonrheumatic aortic (valve) stenosis: Secondary | ICD-10-CM | POA: Diagnosis not present

## 2016-08-21 DIAGNOSIS — R0609 Other forms of dyspnea: Secondary | ICD-10-CM | POA: Diagnosis not present

## 2016-08-21 DIAGNOSIS — I9789 Other postprocedural complications and disorders of the circulatory system, not elsewhere classified: Secondary | ICD-10-CM | POA: Diagnosis not present

## 2016-08-21 DIAGNOSIS — I10 Essential (primary) hypertension: Secondary | ICD-10-CM | POA: Diagnosis not present

## 2016-08-21 DIAGNOSIS — I2510 Atherosclerotic heart disease of native coronary artery without angina pectoris: Secondary | ICD-10-CM | POA: Diagnosis not present

## 2016-08-21 DIAGNOSIS — I4891 Unspecified atrial fibrillation: Secondary | ICD-10-CM | POA: Diagnosis not present

## 2016-08-23 DIAGNOSIS — H33311 Horseshoe tear of retina without detachment, right eye: Secondary | ICD-10-CM | POA: Diagnosis not present

## 2016-08-23 DIAGNOSIS — H33022 Retinal detachment with multiple breaks, left eye: Secondary | ICD-10-CM | POA: Diagnosis not present

## 2016-08-28 ENCOUNTER — Other Ambulatory Visit: Payer: Self-pay | Admitting: Internal Medicine

## 2016-09-10 DIAGNOSIS — R69 Illness, unspecified: Secondary | ICD-10-CM | POA: Diagnosis not present

## 2016-09-17 DIAGNOSIS — Z0100 Encounter for examination of eyes and vision without abnormal findings: Secondary | ICD-10-CM | POA: Diagnosis not present

## 2016-09-19 DIAGNOSIS — R69 Illness, unspecified: Secondary | ICD-10-CM | POA: Diagnosis not present

## 2016-10-30 ENCOUNTER — Ambulatory Visit (INDEPENDENT_AMBULATORY_CARE_PROVIDER_SITE_OTHER): Payer: Medicare HMO | Admitting: Internal Medicine

## 2016-10-30 ENCOUNTER — Encounter: Payer: Self-pay | Admitting: Internal Medicine

## 2016-10-30 VITALS — BP 110/70 | HR 83 | Temp 98.3°F | Ht 71.5 in | Wt 209.0 lb

## 2016-10-30 DIAGNOSIS — R748 Abnormal levels of other serum enzymes: Secondary | ICD-10-CM

## 2016-10-30 DIAGNOSIS — Z23 Encounter for immunization: Secondary | ICD-10-CM

## 2016-10-30 DIAGNOSIS — K635 Polyp of colon: Secondary | ICD-10-CM | POA: Diagnosis not present

## 2016-10-30 DIAGNOSIS — I10 Essential (primary) hypertension: Secondary | ICD-10-CM | POA: Diagnosis not present

## 2016-10-30 DIAGNOSIS — E782 Mixed hyperlipidemia: Secondary | ICD-10-CM

## 2016-10-30 MED ORDER — IPRATROPIUM BROMIDE 0.06 % NA SOLN
2.0000 | Freq: Three times a day (TID) | NASAL | 12 refills | Status: DC
Start: 2016-10-30 — End: 2017-04-29

## 2016-10-30 NOTE — Assessment & Plan Note (Signed)
Recent CMP reviewed from [facility] and enzymes are decreasing still and AST 40 and ALT 50 which is improved from prior. He will continue to work on diet and exercise for the fatty liver disease.

## 2016-10-30 NOTE — Assessment & Plan Note (Signed)
Recent lipid panel at [facility] on daily crestor is normal with LDL 86. Could use some titration if able for LDL <70.

## 2016-10-30 NOTE — Assessment & Plan Note (Signed)
Taking coreg, chlorthalidone and losartan with normal CMP recently. Will continue and refill of losartan done. Complicated by CAD and recent CABG.

## 2016-10-30 NOTE — Assessment & Plan Note (Signed)
Referral to GI, last colonoscopy 2015 and report in Epic. He wishes to switch GI providers.

## 2016-10-30 NOTE — Patient Instructions (Signed)
We have sent in the refills and will get you in for the colonoscopy.

## 2016-10-30 NOTE — Progress Notes (Signed)
   Subjective:    Patient ID: Jeffrey Stein, male    DOB: Oct 01, 1945, 71 y.o.   MRN: [id number]  HPI The patient is a 71 YO man coming in for follow up of his liver enzymes (has been working on exercise and diet, prior workup with GI concluded due to fatty liver infiltration, no excessive alcohol although social usage), and his colon polyps (last colonoscopy 3 years ago and due for repeat, wants to switch offices, denies blood in stool), and his blood pressure (taking losartan and coreg and chlorthalidone, no side effects, has had open heart CABG since last visit, cardiology at [facility] has changed his medications, last BMP with them about 6 months ago, denies side effects or headaches or chest pains).   Review of Systems  Constitutional: Negative.   HENT: Negative.   Eyes: Negative.   Respiratory: Negative for cough, chest tightness and shortness of breath.   Cardiovascular: Negative for chest pain, palpitations and leg swelling.  Gastrointestinal: Negative for abdominal distention, abdominal pain, constipation, diarrhea, nausea and vomiting.  Musculoskeletal: Negative.   Skin: Negative.   Neurological: Negative.   Psychiatric/Behavioral: Negative.       Objective:   Physical Exam  Constitutional: He is oriented to person, place, and time. He appears well-developed and well-nourished.  HENT:  Head: Normocephalic and atraumatic.  Eyes: EOM are normal.  Neck: Normal range of motion.  Cardiovascular: Normal rate and regular rhythm.   Pulmonary/Chest: Effort normal and breath sounds normal. No respiratory distress. He has no wheezes. He has no rales.  Abdominal: Soft. Bowel sounds are normal. He exhibits no distension. There is no tenderness. There is no rebound.  Musculoskeletal: He exhibits no edema.  Neurological: He is alert and oriented to person, place, and time. Coordination normal.  Skin: Skin is warm and dry.  Psychiatric: He has a normal mood and affect.   Vitals:   10/30/16  0839  BP: 110/70  Pulse: 83  Temp: 98.3 F (36.8 C)  TempSrc: Oral  SpO2: 98%  Weight: 209 lb (94.8 kg)  Height: 5' 11.5" (1.816 m)      Assessment & Plan:  Flu shot given at visit

## 2016-11-01 ENCOUNTER — Telehealth: Payer: Self-pay | Admitting: Gastroenterology

## 2016-11-02 NOTE — Telephone Encounter (Signed)
I reviewed his records. He is due for a colonoscopy in November of this year ( or after) . The last cardiology office from July indicates that they might stop his plavix when he is a year out from heart bypass surgery, which would be in December.  If so, it would be better to do this routine colonoscopy after he is off the plavix.  The best thing would be for him to see [place] in clinic after the Dec/Jan cardiology visit so all of this can be addressed and appropriate plans made for his procedure.

## 2016-11-06 NOTE — Telephone Encounter (Signed)
Patient states that he will call to schedule after he is seen by his cardiologist. He needs to schedule an OV.

## 2016-11-22 DIAGNOSIS — I4891 Unspecified atrial fibrillation: Secondary | ICD-10-CM | POA: Diagnosis not present

## 2016-11-22 DIAGNOSIS — I712 Thoracic aortic aneurysm, without rupture: Secondary | ICD-10-CM | POA: Diagnosis not present

## 2016-11-22 DIAGNOSIS — E782 Mixed hyperlipidemia: Secondary | ICD-10-CM | POA: Diagnosis not present

## 2016-11-22 DIAGNOSIS — I10 Essential (primary) hypertension: Secondary | ICD-10-CM | POA: Diagnosis not present

## 2016-11-22 DIAGNOSIS — I9789 Other postprocedural complications and disorders of the circulatory system, not elsewhere classified: Secondary | ICD-10-CM | POA: Diagnosis not present

## 2016-11-22 DIAGNOSIS — I2510 Atherosclerotic heart disease of native coronary artery without angina pectoris: Secondary | ICD-10-CM | POA: Diagnosis not present

## 2016-11-26 DIAGNOSIS — C61 Malignant neoplasm of prostate: Secondary | ICD-10-CM | POA: Diagnosis not present

## 2016-12-18 NOTE — Telephone Encounter (Signed)
Patient has not returned call to schedule. Records will be in "records reviewed" folder.

## 2017-01-03 ENCOUNTER — Encounter: Payer: Self-pay | Admitting: Internal Medicine

## 2017-01-07 ENCOUNTER — Telehealth: Payer: Self-pay | Admitting: Internal Medicine

## 2017-01-07 NOTE — Telephone Encounter (Signed)
Fine with me

## 2017-01-07 NOTE — Telephone Encounter (Signed)
Copied from [place] [id number]. Topic: Inquiry >> Jan 07, 2017 12:15 PM Valla Leaver wrote: Reason for CRM: Patient's wife Stanton Kidney calling requesting to switch PCP to either Dr. Deborra Medina or Dr. Ethelene Hal over at [place]. It's more convenient to get to.   Please advise on transfer

## 2017-02-28 DIAGNOSIS — H04123 Dry eye syndrome of bilateral lacrimal glands: Secondary | ICD-10-CM | POA: Diagnosis not present

## 2017-02-28 DIAGNOSIS — H35373 Puckering of macula, bilateral: Secondary | ICD-10-CM | POA: Diagnosis not present

## 2017-02-28 DIAGNOSIS — H33311 Horseshoe tear of retina without detachment, right eye: Secondary | ICD-10-CM | POA: Diagnosis not present

## 2017-02-28 DIAGNOSIS — H33022 Retinal detachment with multiple breaks, left eye: Secondary | ICD-10-CM | POA: Diagnosis not present

## 2017-03-04 DIAGNOSIS — R69 Illness, unspecified: Secondary | ICD-10-CM | POA: Diagnosis not present

## 2017-04-29 ENCOUNTER — Ambulatory Visit (INDEPENDENT_AMBULATORY_CARE_PROVIDER_SITE_OTHER): Payer: Medicare HMO | Admitting: Family Medicine

## 2017-04-29 ENCOUNTER — Ambulatory Visit: Payer: Medicare HMO | Admitting: Internal Medicine

## 2017-04-29 ENCOUNTER — Encounter: Payer: Self-pay | Admitting: Family Medicine

## 2017-04-29 VITALS — BP 128/78 | HR 76 | Ht 71.5 in | Wt 211.2 lb

## 2017-04-29 DIAGNOSIS — R69 Illness, unspecified: Secondary | ICD-10-CM | POA: Diagnosis not present

## 2017-04-29 DIAGNOSIS — F5101 Primary insomnia: Secondary | ICD-10-CM | POA: Insufficient documentation

## 2017-04-29 DIAGNOSIS — E782 Mixed hyperlipidemia: Secondary | ICD-10-CM

## 2017-04-29 DIAGNOSIS — K635 Polyp of colon: Secondary | ICD-10-CM

## 2017-04-29 DIAGNOSIS — L309 Dermatitis, unspecified: Secondary | ICD-10-CM | POA: Diagnosis not present

## 2017-04-29 DIAGNOSIS — I10 Essential (primary) hypertension: Secondary | ICD-10-CM

## 2017-04-29 DIAGNOSIS — C61 Malignant neoplasm of prostate: Secondary | ICD-10-CM | POA: Diagnosis not present

## 2017-04-29 LAB — CBC
HCT: 40.4 % (ref 39.0–52.0)
HEMOGLOBIN: 13.9 g/dL (ref 13.0–17.0)
MCHC: 34.5 g/dL (ref 30.0–36.0)
MCV: 102.9 fl — ABNORMAL HIGH (ref 78.0–100.0)
PLATELETS: 166 10*3/uL (ref 150.0–400.0)
RBC: 3.92 Mil/uL — AB (ref 4.22–5.81)
RDW: 13.7 % (ref 11.5–15.5)
WBC: 4.1 10*3/uL (ref 4.0–10.5)

## 2017-04-29 MED ORDER — TRIAMCINOLONE ACETONIDE 0.1 % EX OINT: 1.0000 "application " | TOPICAL_OINTMENT | Freq: Two times a day (BID) | CUTANEOUS | 0 refills | Status: DC | PRN

## 2017-04-29 NOTE — Progress Notes (Signed)
Subjective:  Patient ID: Jeffrey Stein, male    DOB: [birth year]  Age: 72 y.o. MRN: [id number]  CC: Establish Care   HPI Jeffrey Stein presents for establishment of care and to follow-up on his skin rash and some insomnia.  Patient has a rash at the top of his gluteal cleft that is been treated successfully with a steroid ointment in the past.  Patient has a history of coronary artery disease treated with CABG x3 in 2017.  He is followed at [facility] for this issue and they are managing his hypertension and cholesterol.  He is status post prostatectomy for prostate cancer and is also followed for this issue at [facility].  Last colonoscopy was in 2015 and he is scheduled for follow-up colonoscopy in 2020.  He has a history indicated in the chart of hemochromatosis.  He was unaware of this.  Hemoglobin from care everywhere in 4 of 2018 was 15.  He has had hearing impaired.  2 grown daughters.  One lives here in [place] and the other lives in the [place] area.  More grandchildren ages ranging from 30 down to 54.  In doing both strength and cardio training.  He does not smoke or use illicit drugs.  He drinks alcohol on occasion.  [facility] Stein has a past medical history of Abnormal liver enzymes, Allergic rhinitis, Allergy, Colon cancer ([place]), Colon polyps, Diverticulitis, GERD (gastroesophageal reflux disease), Heart murmur, Hemochromatosis, HTN (hypertension), Hyperlipemia, Meniere's disease, and Prostate cancer ([place]).   He has a past surgical history that includes Prostatectomy; Appendectomy; Excision basal cell carcinoma (2014); and tripple bypass (N/A, 12/07/2015).   His family history includes Colon cancer in his mother; Emphysema in his father.He reports that he has never smoked. He has never used smokeless tobacco. He reports that he drinks alcohol. He reports that he does not use drugs.  Outpatient Medications Prior to Visit  Medication Sig Dispense  Refill  . aspirin EC 81 MG tablet Take 81 mg by mouth daily.    . carvedilol (COREG) 6.25 MG tablet Take 1 tablet (6.25 mg total) by mouth 2 (two) times daily. 180 tablet 3  . clopidogrel (PLAVIX) 75 MG tablet     . losartan (COZAAR) 100 MG tablet Take 1 tablet (100 mg total) by mouth daily. 30 tablet 3  . potassium chloride (K-DUR,KLOR-CON) 10 MEQ tablet     . rosuvastatin (CRESTOR) 10 MG tablet Take one tablet three times weekly. Increase as tolerated. 30 tablet 6  . vitamin E 400 UNIT capsule Take 2 capsules by mouth daily.    . chlorthalidone (HYGROTON) 25 MG tablet Take by mouth.    Marland Kitchen ipratropium (ATROVENT) 0.06 % nasal spray Place 2 sprays into both nostrils 3 (three) times daily. 15 mL 12   No facility-administered medications prior to visit.     ROS Review of Systems  Constitutional: Negative for chills, fatigue and unexpected weight change.  HENT: Positive for hearing loss. Negative for trouble swallowing.   Eyes: Negative for photophobia and visual disturbance.  Respiratory: Negative.   Cardiovascular: Negative.   Gastrointestinal: Negative.   Endocrine: Negative for polyphagia and polyuria.  Genitourinary: Negative for decreased urine volume, difficulty urinating and hematuria.  Musculoskeletal: Negative for gait problem and myalgias.  Skin: Positive for rash.  Allergic/Immunologic: Negative for immunocompromised state.  Neurological: Negative for weakness and headaches.  Hematological: Does not bruise/bleed easily.  Psychiatric/Behavioral: Negative.     Objective:  BP 128/78 (  Patient Position: Sitting, Cuff Size: Normal)   Pulse 76   Ht 5' 11.5" (1.816 m)   Wt 211 lb 4 oz (95.8 kg)   SpO2 97%   BMI 29.05 kg/m   Physical Exam  Constitutional: He is oriented to person, place, and time. He appears well-developed and well-nourished. No distress.  HENT:  Head: Normocephalic and atraumatic.  Right Ear: External ear normal.  Left Ear: External ear normal.    Mouth/Throat: Oropharynx is clear and moist. No oropharyngeal exudate.  Eyes: Pupils are equal, round, and reactive to light. Conjunctivae are normal. Right eye exhibits no discharge. Left eye exhibits no discharge. No scleral icterus.  Neck: Neck supple. No JVD present. No tracheal deviation present. No thyromegaly present.  Cardiovascular: Normal rate, regular rhythm and normal heart sounds.  Pulmonary/Chest: Effort normal and breath sounds normal. No stridor.  Abdominal: Soft. Bowel sounds are normal. He exhibits no distension. There is no tenderness. There is no rebound.  Lymphadenopathy:    He has no cervical adenopathy.  Neurological: He is alert and oriented to person, place, and time.  Skin: Skin is warm and dry. He is not diaphoretic.     Psychiatric: He has a normal mood and affect. His behavior is normal.      Assessment & Plan:   Cammeron was seen today for establish care.  Diagnoses and all orders for this visit:  Essential hypertension  Polyp of colon, unspecified part of colon, unspecified type  CA of prostate ([place])  Mixed hyperlipidemia  Primary insomnia  Eczema, unspecified type -     triamcinolone ointment (KENALOG) 0.1 %; Apply 1 application topically 2 (two) times daily as needed.  Hemochromatosis, unspecified hemochromatosis type -     Iron, TIBC and Ferritin Panel -     CBC   I have discontinued Jeffrey Stein's ipratropium. I am also having him start on triamcinolone ointment. Additionally, I am having him maintain his aspirin EC, vitamin E, carvedilol, rosuvastatin, losartan, clopidogrel, chlorthalidone, and potassium chloride.  Meds ordered this encounter  Medications  . triamcinolone ointment (KENALOG) 0.1 %    Sig: Apply 1 application topically 2 (two) times daily as needed.    Dispense:  30 g    Refill:  0   Patient asks about trying melatonin for occasional sleep difficulty no so that would be fine.  He will follow-up in the next 2 to 3  months as needed and pending his hemoglobin and ferritin results.  Follow-up: Return in about 3 months (around 07/30/2017), or if symptoms worsen or fail to improve.  Libby Maw, MD

## 2017-04-29 NOTE — Patient Instructions (Signed)
Insomnia Insomnia is a sleep disorder that makes it difficult to fall asleep or to stay asleep. Insomnia can cause tiredness (fatigue), low energy, difficulty concentrating, mood swings, and poor performance at work or school. There are three different ways to classify insomnia:  Difficulty falling asleep.  Difficulty staying asleep.  Waking up too early in the morning.  Any type of insomnia can be long-term (chronic) or short-term (acute). Both are common. Short-term insomnia usually lasts for three months or less. Chronic insomnia occurs at least three times a week for longer than three months. What are the causes? Insomnia may be caused by another condition, situation, or substance, such as:  Anxiety.  Certain medicines.  Gastroesophageal reflux disease (GERD) or other gastrointestinal conditions.  Asthma or other breathing conditions.  Restless legs syndrome, sleep apnea, or other sleep disorders.  Chronic pain.  Menopause. This may include hot flashes.  Stroke.  Abuse of alcohol, tobacco, or illegal drugs.  Depression.  Caffeine.  Neurological disorders, such as Alzheimer disease.  An overactive thyroid (hyperthyroidism).  The cause of insomnia may not be known. What increases the risk? Risk factors for insomnia include:  Gender. Women are more commonly affected than men.  Age. Insomnia is more common as you get older.  Stress. This may involve your professional or personal life.  Income. Insomnia is more common in people with lower income.  Lack of exercise.  Irregular work schedule or night shifts.  Traveling between different time zones.  What are the signs or symptoms? If you have insomnia, trouble falling asleep or trouble staying asleep is the main symptom. This may lead to other symptoms, such as:  Feeling fatigued.  Feeling nervous about going to sleep.  Not feeling rested in the morning.  Having trouble concentrating.  Feeling  irritable, anxious, or depressed.  How is this treated? Treatment for insomnia depends on the cause. If your insomnia is caused by an underlying condition, treatment will focus on addressing the condition. Treatment may also include:  Medicines to help you sleep.  Counseling or therapy.  Lifestyle adjustments.  Follow these instructions at home:  Take medicines only as directed by your health care provider.  Keep regular sleeping and waking hours. Avoid naps.  Keep a sleep diary to help you and your health care provider figure out what could be causing your insomnia. Include: ? When you sleep. ? When you wake up during the night. ? How well you sleep. ? How rested you feel the next day. ? Any side effects of medicines you are taking. ? What you eat and drink.  Make your bedroom a comfortable place where it is easy to fall asleep: ? Put up shades or special blackout curtains to block light from outside. ? Use a white noise machine to block noise. ? Keep the temperature cool.  Exercise regularly as directed by your health care provider. Avoid exercising right before bedtime.  Use relaxation techniques to manage stress. Ask your health care provider to suggest some techniques that may work well for you. These may include: ? Breathing exercises. ? Routines to release muscle tension. ? Visualizing peaceful scenes.  Cut back on alcohol, caffeinated beverages, and cigarettes, especially close to bedtime. These can disrupt your sleep.  Do not overeat or eat spicy foods right before bedtime. This can lead to digestive discomfort that can make it hard for you to sleep.  Limit screen use before bedtime. This includes: ? Watching TV. ? Using your smartphone, tablet, and   computer.  Stick to a routine. This can help you fall asleep faster. Try to do a quiet activity, brush your teeth, and go to bed at the same time each night.  Get out of bed if you are still awake after 15 minutes  of trying to sleep. Keep the lights down, but try reading or doing a quiet activity. When you feel sleepy, go back to bed.  Make sure that you drive carefully. Avoid driving if you feel very sleepy.  Keep all follow-up appointments as directed by your health care provider. This is important. Contact a health care provider if:  You are tired throughout the day or have trouble in your daily routine due to sleepiness.  You continue to have sleep problems or your sleep problems get worse. Get help right away if:  You have serious thoughts about hurting yourself or someone else. This information is not intended to replace advice given to you by your health care provider. Make sure you discuss any questions you have with your health care provider. Document Released: 01/20/2000 Document Revised: 06/24/2015 Document Reviewed: 10/23/2013 Elsevier Interactive Patient Education  2018 [employer]. Hemochromatosis Hemochromatosis, also called iron storage disease, is a condition in which the body stores too much iron. The excess iron builds up in your joints, heart, liver, pancreas, and other organs and damages them. What are the causes? Hemochromatosis may be caused by:  Abnormal genes. These are passed down (inherited) from both of your parents.  Having blood transfusions.  Having too much iron in your diet.  What increases the risk?  Being Caucasian.  Inheriting abnormal genes from both your parents.  Having a severe or long-term loss of red blood cells (anemia). What are the signs or symptoms? Signs and symptoms can start at any age, but usually start in middle age. They may include:  Fatigue.  Weakness.  Joint pain.  Abdominal pain.  Weight loss.  Pearline Cables or bronze skin coloring.  Loss of interest in sex.  Loss of menstrual periods in women.  Loss of body hair.  Shortness of breath.  Late signs of hemochromatosis include damage to the liver, heart, or pancreas. This  may lead to:  Liver cancer.  Abnormal heart rhythms.  Heart failure.  Diabetes.  How is this diagnosed? Your health care provider will perform a physical exam and ask about your symptoms and family history. Tests may be done to confirm the diagnosis. Blood tests may include:  Transferrin saturation. This test measures how much iron is bound to hemoglobin in your blood. Hemoglobin is a substance in red blood cells that carries oxygen to the tissues of the body.  Serum ferritin. This test measures a protein that stores iron in your blood.  A test to check for the abnormal genes that cause the condition.  You may have a tissue sample taken from your liver with a needle (biopsy) for testing. The results will show if iron is building up in your liver. How is this treated? Hemochromatosis is treated by removing iron by taking blood (phlebotomy). Having a phlebotomy is similar to having blood taken for donation. The procedure is simple and effective as long as it is started before organ damage develops. When you begin treatment, you may have a pint of blood removed once or twice a week. You may have blood tests done to determine when your iron levels return to normal. Once your iron levels are normal, you may only need to have a phlebotomy every few  months. Follow these instructions at home:  Do not eat a lot of foods that are high in iron. Iron-rich foods include red meats and organ meats.  Do not take dietary supplements that contain iron.  Do not take vitamin C supplements. Vitamin C increases iron absorption.  Do not eat raw shellfish or raw fish. Hemochromatosis may increase your chance of infection from these foods.  If you have liver damage, do not drink any alcohol.  If you do not have liver damage, limit alcohol intake to no more than 1 drink per day for nonpregnant women and 2 drinks per day for men. One drink equals 12 ounces of beer, 5 ounces of wine, or 1 ounces of hard  liquor.  Try to exercise for at least 30 minutes on most days of the week.  Keep all follow-up visits as directed by your health care provider. This is important. Contact a health care provider if:  You have fatigue.  You have weakness.  You have joint pain.  You have abdominal pain.  You experience weight loss.  You have shortness of breath. Get help right away if:  You have chest pain.  You have trouble breathing. This information is not intended to replace advice given to you by your health care provider. Make sure you discuss any questions you have with your health care provider. Document Released: 01/20/2000 Document Revised: 06/30/2015 Document Reviewed: 03/18/2013 Elsevier Interactive Patient Education  2018 [employer].  Rash A rash is a change in the color of the skin. A rash can also change the way your skin feels. There are many different conditions and factors that can cause a rash. Follow these instructions at home: Pay attention to any changes in your symptoms. Follow these instructions to help with your condition: Medicine Take or apply over-the-counter and prescription medicines only as told by your health care provider. These may include:  Corticosteroid cream.  Anti-itch lotions.  Oral antihistamines.  Skin Care  Apply cool compresses to the affected areas.  Try taking a bath with: ? Epsom salts. Follow the instructions on the packaging. You can get these at your local pharmacy or grocery store. ? Baking soda. Pour a small amount into the bath as told by your health care provider. ? Colloidal oatmeal. Follow the instructions on the packaging. You can get this at your local pharmacy or grocery store.  Try applying baking soda paste to your skin. Stir water into baking soda until it reaches a paste-like consistency.  Do not scratch or rub your skin.  Avoid covering the rash. Make sure the rash is exposed to air as much as possible. General  instructions  Avoid hot showers or baths, which can make itching worse. A cold shower may help.  Avoid scented soaps, detergents, and perfumes. Use gentle soaps, detergents, perfumes, and other cosmetic products.  Avoid any substance that causes your rash. Keep a journal to help track what causes your rash. Write down: ? What you eat. ? What cosmetic products you use. ? What you drink. ? What you wear. This includes jewelry.  Keep all follow-up visits as told by your health care provider. This is important. Contact a health care provider if:  You sweat at night.  You lose weight.  You urinate more than normal.  You feel weak.  You vomit.  Your skin or the whites of your eyes look yellow (jaundice).  Your skin: ? Tingles. ? Is numb.  Your rash: ? Does not go  away after several days. ? Gets worse.  You are: ? Unusually thirsty. ? More tired than normal.  You have: ? New symptoms. ? Pain in your abdomen. ? A fever. ? Diarrhea. Get help right away if:  You develop a rash that covers all or most of your body. The rash may or may not be painful.  You develop blisters that: ? Are on top of the rash. ? Grow larger or grow together. ? Are painful. ? Are inside your nose or mouth.  You develop a rash that: ? Looks like purple pinprick-sized spots all over your body. ? Has a "bull's eye" or looks like a target. ? Is not related to sun exposure, is red and painful, and causes your skin to peel. This information is not intended to replace advice given to you by your health care provider. Make sure you discuss any questions you have with your health care provider. Document Released: 01/12/2002 Document Revised: 06/28/2015 Document Reviewed: 06/09/2014 Elsevier Interactive Patient Education  2018 [employer].

## 2017-04-30 LAB — IRON,TIBC AND FERRITIN PANEL
%SAT: 32 % (calc) (ref 15–60)
Ferritin: 215 ng/mL (ref 20–380)
Iron: 108 ug/dL (ref 50–180)
TIBC: 336 mcg/dL (calc) (ref 250–425)

## 2017-06-07 DIAGNOSIS — E782 Mixed hyperlipidemia: Secondary | ICD-10-CM | POA: Diagnosis not present

## 2017-06-07 DIAGNOSIS — I2510 Atherosclerotic heart disease of native coronary artery without angina pectoris: Secondary | ICD-10-CM | POA: Diagnosis not present

## 2017-06-07 DIAGNOSIS — R0609 Other forms of dyspnea: Secondary | ICD-10-CM | POA: Diagnosis not present

## 2017-06-07 DIAGNOSIS — I10 Essential (primary) hypertension: Secondary | ICD-10-CM | POA: Diagnosis not present

## 2017-06-07 DIAGNOSIS — I350 Nonrheumatic aortic (valve) stenosis: Secondary | ICD-10-CM | POA: Diagnosis not present

## 2017-06-07 DIAGNOSIS — I712 Thoracic aortic aneurysm, without rupture: Secondary | ICD-10-CM | POA: Diagnosis not present

## 2017-06-24 DIAGNOSIS — N5231 Erectile dysfunction following radical prostatectomy: Secondary | ICD-10-CM | POA: Diagnosis not present

## 2017-06-24 DIAGNOSIS — C61 Malignant neoplasm of prostate: Secondary | ICD-10-CM | POA: Diagnosis not present

## 2017-07-30 DIAGNOSIS — C61 Malignant neoplasm of prostate: Secondary | ICD-10-CM | POA: Diagnosis not present

## 2017-09-16 ENCOUNTER — Ambulatory Visit (INDEPENDENT_AMBULATORY_CARE_PROVIDER_SITE_OTHER): Payer: Medicare HMO | Admitting: Family Medicine

## 2017-09-16 ENCOUNTER — Encounter: Payer: Self-pay | Admitting: Family Medicine

## 2017-09-16 VITALS — BP 132/80 | HR 71 | Ht 71.5 in | Wt 213.4 lb

## 2017-09-16 DIAGNOSIS — T753XXA Motion sickness, initial encounter: Secondary | ICD-10-CM

## 2017-09-16 MED ORDER — MECLIZINE HCL 25 MG PO TABS: 25.0000 mg | ORAL_TABLET | Freq: Three times a day (TID) | ORAL | 0 refills | Status: DC | PRN

## 2017-09-16 NOTE — Patient Instructions (Signed)
Motion Sickness Motion sickness is an unpleasant, temporary feeling of dizziness and nausea that occurs when a person is traveling in a moving vehicle. The person may also have abdominal pain, sweating, and paleness. This condition can occur while you travel in a boat, a car, or an airplane, or even on an amusement park ride. The symptoms of motion sickness usually get better after the motion or traveling stops, but problems may persist for hours or days. What are the causes? This condition may be caused by overstimulation of a part of the inner ear that is called the semicircular canals. The semicircular canals help you to keep your balance by sending signals to your brain about movement. If these canals are stimulated too much from motion of your body, you can develop motion sickness. This condition can also occur if your brain gets conflicting signals from the various motion sensors in your body. The effects of motion sickness may be increased by stress, dehydration, other illnesses, or drinking too much alcohol. What increases the risk? This condition is more likely to develop in:  Children who are 32-32 years old.  Women, especially pregnant women or women who take birth control pills.  People who get migraine headaches.  People who have inner ear disorders.  People who take certain medicines.  What are the signs or symptoms? Symptoms of this condition include:  Nausea.  Dizziness.  Vomiting.  Sweating.  Pain in the abdomen.  Being unsteady when walking.  Paleness.  How is this diagnosed? This condition is diagnosed based on a physical exam, your medical history, and your description of the symptoms that you have while traveling or moving. How is this treated? For most people, symptoms fade quickly after the motion stops. A health care provider may recommend or prescribe medicine to help prevent motion sickness. In some cases, this may be in the form of a patch that is  placed behind the ear. Avoiding certain things or using certain techniques before or during travel can also help to prevent episodes of motion sickness. Follow these instructions at home:  Take medicines only as directed by your health care provider.  If you use a motion sickness patch, wash your hands after you put the patch on. Touching your hands to your eyes after using the patch can enlarge (dilate) your pupils for 1-2 days and disturb your vision.  Drink enough fluid to keep your urine clear or pale yellow. You need to stay well hydrated. Take small, frequent sips of liquids as needed. How is this prevented?  If possible, avoid situations that cause your motion sickness.  Do not eat large meals before or during travel. When you travel longer distances, eat small, bland meals.  Do not drink alcohol before or during travel.  Sit in an area of the vehicle where the least motion occurs. ? On an airplane, sit near the wing. Lie back in your seat if possible. ? On a boat, sit near the middle. ? When you ride in a car, sit in the front seat, not the backseat.  Breathe slowly and deeply while you ride in a moving vehicle.  Do not read or focus on nearby objects when you are riding in a moving vehicle.  Try watching the horizon or a distant object while you are in a moving vehicle. This is especially helpful when you travel in a boat. In a car, ride in the front seat and look out the front window.  Do not smoke before  or during travel. Avoid areas where people are smoking.  Get some fresh air if possible. For example, open a window when you are riding in a car.  Plan ahead for travel. Talk with your health care provider about whether you should use medicines to help prevent motion sickness. Contact a health care provider if:  Your vomiting or nausea does not go away within 24 hours.  You notice blood in your vomit. The blood could be dark red, or it may look like coffee  grounds.  You faint or you have severe dizziness or light-headedness when you stand up. These may be signs of dehydration.  You have a fever. Get help right away if:  You have severe pain in your abdomen or chest.  You have trouble breathing.  You have a severe headache.  You develop weakness or numbness on one side of your body.  You have trouble speaking. This information is not intended to replace advice given to you by your health care provider. Make sure you discuss any questions you have with your health care provider. Document Released: 01/22/2005 Document Revised: 06/27/2015 Document Reviewed: 12/30/2013 Elsevier Interactive Patient Education  [employer].

## 2017-09-16 NOTE — Progress Notes (Signed)
Subjective:  Patient ID: Jeffrey Stein, male    DOB: Sep 26, 1945  Age: 72 y.o. MRN: [id number]  CC: Medication Refill   HPI Jeffrey Stein presents for treatment advice of prevention of motion sickness for an [employer].  But will hold about 600 people.  He is never had an issue with this in the past with is interested in prophylaxis.  He has radiation treatment scheduled at [facility] for PSA velocity.  He is status post prostatectomy some years ago.  Outpatient Medications Prior to Visit  Medication Sig Dispense Refill  . aspirin EC 81 MG tablet Take 81 mg by mouth daily.    . carvedilol (COREG) 6.25 MG tablet Take 1 tablet (6.25 mg total) by mouth 2 (two) times daily. 180 tablet 3  . clopidogrel (PLAVIX) 75 MG tablet     . losartan (COZAAR) 100 MG tablet Take 1 tablet (100 mg total) by mouth daily. 30 tablet 3  . potassium chloride (K-DUR,KLOR-CON) 10 MEQ tablet     . rosuvastatin (CRESTOR) 20 MG tablet Take 1 tablet by mouth daily.    Marland Kitchen triamcinolone ointment (KENALOG) 0.1 % Apply 1 application topically 2 (two) times daily as needed. 30 g 0  . vitamin E 400 UNIT capsule Take 2 capsules by mouth daily.    . chlorthalidone (HYGROTON) 25 MG tablet Take by mouth.    . rosuvastatin (CRESTOR) 10 MG tablet Take one tablet three times weekly. Increase as tolerated. 30 tablet 6   No facility-administered medications prior to visit.     ROS Review of Systems  Constitutional: Negative.   Respiratory: Negative.   Cardiovascular: Negative.   Gastrointestinal: Negative.   Genitourinary: Negative for decreased urine volume and difficulty urinating.  Hematological: Does not bruise/bleed easily.  Psychiatric/Behavioral: Negative.     Objective:  BP 132/80   Pulse 71   Ht 5' 11.5" (1.816 m)   Wt 213 lb 6 oz (96.8 kg)   SpO2 100%   BMI 29.35 kg/m   BP Readings from Last 3 Encounters:  09/16/17 132/80  04/29/17 128/78  10/30/16 110/70    Wt Readings from Last 3  Encounters:  09/16/17 213 lb 6 oz (96.8 kg)  04/29/17 211 lb 4 oz (95.8 kg)  10/30/16 209 lb (94.8 kg)    Physical Exam  Constitutional: He is oriented to person, place, and time. He appears well-developed and well-nourished. No distress.  HENT:  Head: Normocephalic and atraumatic.  Right Ear: External ear normal.  Left Ear: External ear normal.  Eyes: Right eye exhibits no discharge. Left eye exhibits no discharge. No scleral icterus.  Neck: No JVD present. No tracheal deviation present.  Pulmonary/Chest: Breath sounds normal.  Neurological: He is alert and oriented to person, place, and time.  Skin: He is not diaphoretic.  Psychiatric: He has a normal mood and affect. His behavior is normal.    Lab Results  Component Value Date   WBC 4.1 04/29/2017   HGB 13.9 04/29/2017   HCT 40.4 04/29/2017   PLT 166.0 04/29/2017   GLUCOSE 118 (H) 08/01/2015   CHOL 226 (H) 08/01/2015   TRIG 116.0 08/01/2015   HDL 42.00 08/01/2015   LDLCALC 160 (H) 08/01/2015   ALT 95 (H) 08/01/2015   AST 69 (H) 08/01/2015   NA 140 08/01/2015   K 4.0 08/01/2015   CL 104 08/01/2015   CREATININE 1.14 08/01/2015   BUN 17 08/01/2015   CO2 27 08/01/2015   TSH 1.640 01/03/2015   HGBA1C  5.8 08/01/2015    US Abdomen Complete W/elastography  Result Date: 01/13/2014 CLINICAL DATA:  Elevated LFTs.  Hepatic steatosis EXAM: ULTRASOUND ABDOMEN ULTRASOUND HEPATIC ELASTOGRAPHY TECHNIQUE: Sonography of the upper abdomen was performed. In addition, ultrasound elastography evaluation of the liver was performed. A region of interest was placed within the right lobe of the liver. Following application of a compressive sonographic pulse, shear waves were detected in the adjacent hepatic tissue and the shear wave velocity was calculated. Multiple assessments were performed at the selected site. Median shear wave velocity is correlated to a Metavir fibrosis score. COMPARISON:  None. FINDINGS: ULTRASOUND ABDOMEN Gallbladder: No  gallstones or wall thickening visualized. No sonographic Murphy sign noted. Common bile duct: Diameter: 5.4 mm Liver: Diffusely echogenic and heterogeneous.  No focal lesion. IVC: No abnormality visualized. Pancreas: Not well seen. Spleen: Size and appearance within normal limits. Right Kidney: Length: 13.2 cm. Echogenicity within normal limits. No mass or hydronephrosis visualized. Left Kidney: Length: 12.6 cm. Echogenicity within normal limits. No mass or hydronephrosis visualized. Abdominal aorta: No aneurysm visualized. Other findings: None. ULTRASOUND HEPATIC ELASTOGRAPHY Device: Siemens Helix VTQ Transducer 4V1 Patient position: Left lateral decubitus Number of measurements:  10 Hepatic Segment:  8 Median velocity:   1.47  m/sec IQR: 0.34 IQR/Median velocity ratio 0.23 Corresponding Metavir fibrosis score:  F2/F3 Risk of fibrosis: Moderate Limitations of exam: None Pertinent findings noted on other imaging exams: Hepatic steatosis noted Please note that abnormal shear wave velocities may also be identified in clinical settings other than with hepatic fibrosis, such as: acute hepatitis, elevated right heart and central venous pressures including use of beta blockers, veno-occlusive disease (Budd-Chiari), infiltrative processes such as mastocytosis/amyloidosis/infiltrative tumor, extrahepatic cholestasis, in the post-prandial state, and liver transplantation. Correlation with patient history, laboratory data, and clinical condition recommended. IMPRESSION: 1. Hepatic steatosis. Median hepatic shear wave velocity is calculated at 1.47 m/sec. Corresponding Metavir fibrosis score is F 2/F 3. Risk of fibrosis is moderate. Follow-up:  Additional testing appropriate Electronically Signed   By: Kerby Moors M.D.   On: 01/13/2014 10:02    Assessment & Plan:   Raylan was seen today for medication refill.  Diagnoses and all orders for this visit:  Motion sickness, initial encounter -     meclizine  (MEDI-MECLIZINE) 25 MG tablet; Take 1 tablet (25 mg total) by mouth 3 (three) times daily as needed for dizziness.   I am having [name] start on meclizine. I am also having him maintain his aspirin EC, vitamin E, carvedilol, losartan, clopidogrel, chlorthalidone, potassium chloride, triamcinolone ointment, and rosuvastatin.  Meds ordered this encounter  Medications  . meclizine (MEDI-MECLIZINE) 25 MG tablet    Sig: Take 1 tablet (25 mg total) by mouth 3 (three) times daily as needed for dizziness.    Dispense:  30 tablet    Refill:  0   Information on prevention of motion sickness given to the patient.  Suggested that we delay his follow-up until he is done with his radiation therapy.  Follow-up: No follow-ups on file.  Libby Maw, MD

## 2017-10-09 DIAGNOSIS — C61 Malignant neoplasm of prostate: Secondary | ICD-10-CM | POA: Diagnosis not present

## 2017-10-15 DIAGNOSIS — C61 Malignant neoplasm of prostate: Secondary | ICD-10-CM | POA: Diagnosis not present

## 2017-10-22 DIAGNOSIS — C61 Malignant neoplasm of prostate: Secondary | ICD-10-CM | POA: Diagnosis not present

## 2017-10-23 DIAGNOSIS — C61 Malignant neoplasm of prostate: Secondary | ICD-10-CM | POA: Diagnosis not present

## 2017-10-24 DIAGNOSIS — C61 Malignant neoplasm of prostate: Secondary | ICD-10-CM | POA: Diagnosis not present

## 2017-10-25 DIAGNOSIS — C61 Malignant neoplasm of prostate: Secondary | ICD-10-CM | POA: Diagnosis not present

## 2017-10-28 DIAGNOSIS — C61 Malignant neoplasm of prostate: Secondary | ICD-10-CM | POA: Diagnosis not present

## 2017-10-29 DIAGNOSIS — C61 Malignant neoplasm of prostate: Secondary | ICD-10-CM | POA: Diagnosis not present

## 2017-10-30 ENCOUNTER — Ambulatory Visit: Payer: Medicare HMO | Admitting: Family Medicine

## 2017-10-30 DIAGNOSIS — C61 Malignant neoplasm of prostate: Secondary | ICD-10-CM | POA: Diagnosis not present

## 2017-10-31 DIAGNOSIS — C61 Malignant neoplasm of prostate: Secondary | ICD-10-CM | POA: Diagnosis not present

## 2017-11-01 DIAGNOSIS — C61 Malignant neoplasm of prostate: Secondary | ICD-10-CM | POA: Diagnosis not present

## 2017-11-04 DIAGNOSIS — C61 Malignant neoplasm of prostate: Secondary | ICD-10-CM | POA: Diagnosis not present

## 2017-11-05 DIAGNOSIS — C61 Malignant neoplasm of prostate: Secondary | ICD-10-CM | POA: Diagnosis not present

## 2017-11-06 DIAGNOSIS — C61 Malignant neoplasm of prostate: Secondary | ICD-10-CM | POA: Diagnosis not present

## 2017-11-07 DIAGNOSIS — C61 Malignant neoplasm of prostate: Secondary | ICD-10-CM | POA: Diagnosis not present

## 2017-11-08 DIAGNOSIS — C61 Malignant neoplasm of prostate: Secondary | ICD-10-CM | POA: Diagnosis not present

## 2017-11-11 DIAGNOSIS — C61 Malignant neoplasm of prostate: Secondary | ICD-10-CM | POA: Diagnosis not present

## 2017-11-12 DIAGNOSIS — C61 Malignant neoplasm of prostate: Secondary | ICD-10-CM | POA: Diagnosis not present

## 2017-11-13 DIAGNOSIS — C61 Malignant neoplasm of prostate: Secondary | ICD-10-CM | POA: Diagnosis not present

## 2017-11-14 DIAGNOSIS — C61 Malignant neoplasm of prostate: Secondary | ICD-10-CM | POA: Diagnosis not present

## 2017-11-15 DIAGNOSIS — C61 Malignant neoplasm of prostate: Secondary | ICD-10-CM | POA: Diagnosis not present

## 2017-11-18 DIAGNOSIS — C61 Malignant neoplasm of prostate: Secondary | ICD-10-CM | POA: Diagnosis not present

## 2017-11-19 DIAGNOSIS — C61 Malignant neoplasm of prostate: Secondary | ICD-10-CM | POA: Diagnosis not present

## 2017-11-20 DIAGNOSIS — C61 Malignant neoplasm of prostate: Secondary | ICD-10-CM | POA: Diagnosis not present

## 2017-11-21 DIAGNOSIS — C61 Malignant neoplasm of prostate: Secondary | ICD-10-CM | POA: Diagnosis not present

## 2017-11-22 DIAGNOSIS — C61 Malignant neoplasm of prostate: Secondary | ICD-10-CM | POA: Diagnosis not present

## 2017-11-25 DIAGNOSIS — Z23 Encounter for immunization: Secondary | ICD-10-CM | POA: Diagnosis not present

## 2017-11-25 DIAGNOSIS — C61 Malignant neoplasm of prostate: Secondary | ICD-10-CM | POA: Diagnosis not present

## 2017-11-26 DIAGNOSIS — C61 Malignant neoplasm of prostate: Secondary | ICD-10-CM | POA: Diagnosis not present

## 2017-11-27 DIAGNOSIS — C61 Malignant neoplasm of prostate: Secondary | ICD-10-CM | POA: Diagnosis not present

## 2017-11-28 DIAGNOSIS — C61 Malignant neoplasm of prostate: Secondary | ICD-10-CM | POA: Diagnosis not present

## 2017-11-29 DIAGNOSIS — C61 Malignant neoplasm of prostate: Secondary | ICD-10-CM | POA: Diagnosis not present

## 2017-12-02 DIAGNOSIS — C61 Malignant neoplasm of prostate: Secondary | ICD-10-CM | POA: Diagnosis not present

## 2017-12-03 DIAGNOSIS — C61 Malignant neoplasm of prostate: Secondary | ICD-10-CM | POA: Diagnosis not present

## 2017-12-04 DIAGNOSIS — C61 Malignant neoplasm of prostate: Secondary | ICD-10-CM | POA: Diagnosis not present

## 2017-12-05 DIAGNOSIS — C61 Malignant neoplasm of prostate: Secondary | ICD-10-CM | POA: Diagnosis not present

## 2017-12-23 DIAGNOSIS — C61 Malignant neoplasm of prostate: Secondary | ICD-10-CM | POA: Diagnosis not present

## 2018-01-13 IMAGING — NM NM MISC PROCEDURE
3 series · 18 of 18 positions shown · non-contrast
Comparison: none

[Series 1: stress-gsp_(id)_sa · 6.4mm · 6.40mm/px · 6 of 512 frames shown]
[frame 43/512]
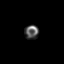
[frame 128/512]
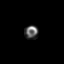
[frame 214/512]
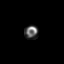
[frame 299/512]
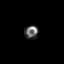
[frame 384/512]
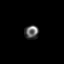
[frame 470/512]
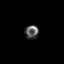

[Series 1: rest_(id)_sa · 6.4mm · 6.40mm/px · 6 of 64 frames shown]
[frame 6/64]
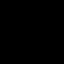
[frame 16/64]
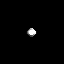
[frame 27/64]
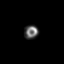
[frame 38/64]
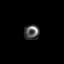
[frame 48/64]
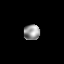
[frame 59/64]
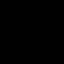

[Series 1: stress-sum-em_(id)_sa · 6.4mm · 6.40mm/px · 6 of 64 frames shown]
[frame 6/64]
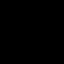
[frame 16/64]
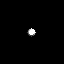
[frame 27/64]
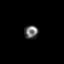
[frame 38/64]
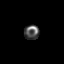
[frame 48/64]
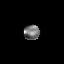
[frame 59/64]
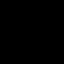

[18 of 18 positions shown; findings below may reference images not displayed]

Canned report from images found in remote index.

Refer to host system for actual result text.

## 2018-02-05 HISTORY — PX: COLONOSCOPY: SHX174

## 2018-03-06 DIAGNOSIS — H0101B Ulcerative blepharitis left eye, upper and lower eyelids: Secondary | ICD-10-CM | POA: Diagnosis not present

## 2018-03-06 DIAGNOSIS — H33022 Retinal detachment with multiple breaks, left eye: Secondary | ICD-10-CM | POA: Diagnosis not present

## 2018-03-06 DIAGNOSIS — H04123 Dry eye syndrome of bilateral lacrimal glands: Secondary | ICD-10-CM | POA: Diagnosis not present

## 2018-03-06 DIAGNOSIS — H0101A Ulcerative blepharitis right eye, upper and lower eyelids: Secondary | ICD-10-CM | POA: Diagnosis not present

## 2018-03-06 DIAGNOSIS — H35373 Puckering of macula, bilateral: Secondary | ICD-10-CM | POA: Diagnosis not present

## 2018-03-06 DIAGNOSIS — H33311 Horseshoe tear of retina without detachment, right eye: Secondary | ICD-10-CM | POA: Diagnosis not present

## 2018-03-14 DIAGNOSIS — Z951 Presence of aortocoronary bypass graft: Secondary | ICD-10-CM | POA: Diagnosis not present

## 2018-03-14 DIAGNOSIS — R0609 Other forms of dyspnea: Secondary | ICD-10-CM | POA: Diagnosis not present

## 2018-03-14 DIAGNOSIS — Z79899 Other long term (current) drug therapy: Secondary | ICD-10-CM | POA: Diagnosis not present

## 2018-03-14 DIAGNOSIS — I10 Essential (primary) hypertension: Secondary | ICD-10-CM | POA: Diagnosis not present

## 2018-03-14 DIAGNOSIS — E782 Mixed hyperlipidemia: Secondary | ICD-10-CM | POA: Diagnosis not present

## 2018-03-14 DIAGNOSIS — R9431 Abnormal electrocardiogram [ECG] [EKG]: Secondary | ICD-10-CM | POA: Diagnosis not present

## 2018-03-14 DIAGNOSIS — I2510 Atherosclerotic heart disease of native coronary artery without angina pectoris: Secondary | ICD-10-CM | POA: Diagnosis not present

## 2018-04-01 DIAGNOSIS — C61 Malignant neoplasm of prostate: Secondary | ICD-10-CM | POA: Diagnosis not present

## 2018-04-25 DIAGNOSIS — Z0100 Encounter for examination of eyes and vision without abnormal findings: Secondary | ICD-10-CM | POA: Diagnosis not present

## 2018-06-04 ENCOUNTER — Encounter: Payer: Self-pay | Admitting: Family Medicine

## 2018-06-04 ENCOUNTER — Ambulatory Visit (INDEPENDENT_AMBULATORY_CARE_PROVIDER_SITE_OTHER): Payer: Medicare HMO | Admitting: Family Medicine

## 2018-06-04 VITALS — Ht 71.5 in

## 2018-06-04 DIAGNOSIS — K635 Polyp of colon: Secondary | ICD-10-CM | POA: Diagnosis not present

## 2018-06-04 NOTE — Progress Notes (Signed)
Virtual Visit via Video Note  I connected with Jeffrey Stein on 06/04/18 at 11:00 AM EDT by a video enabled telemedicine application and verified that I am speaking with the correct person using two identifiers.   I discussed the limitations of evaluation and management by telemedicine and the availability of in person appointments. The patient expressed understanding and agreed to proceed.  History of Present Illness:    Observations/Objective:   Assessment and Plan:   Follow Up Instructions:    I discussed the assessment and treatment plan with the patient. The patient was provided an opportunity to ask questions and all were answered. The patient agreed with the plan and demonstrated an understanding of the instructions.   The patient was advised to call back or seek an in-person evaluation if the symptoms worsen or if the condition fails to improve as anticipated.  I provided 15 minutes of non-face-to-face time during this encounter.   Libby Maw, MD   Established Patient Office Visit  Subjective:  Patient ID: Jeffrey Stein, male    DOB: 1945-04-07  Age: 73 y.o. MRN: [id number]  CC:  Chief Complaint  Patient presents with  . Follow-up    HPI Jeffrey Stein presents for follow-up of his hemochromatosis and history of colon polyps.  Last blood work check showed a normal hemoglobin and ferritin levels.  Told him that there was no evidence for hemochromatosis at this time.  We will continue to follow his CBCs.  He had a colonoscopy done approximately 5 years ago where polyps were discovered.  Follow-up was recommended for 5 years.  Study was performed in another healthcare [facility] system and patient would like to switch over to low [facility] gastroenterology.  He has completed radiation therapy for his prostate cancer per [facility].  He believes his last PSA was 0.06.  They are continuing to follow for him.  His coronary artery disease and hypertension are managed  also by [facility] cardiology.  Past Medical History:  Diagnosis Date  . Abnormal liver enzymes   . Allergic rhinitis   . Allergy   . Colon cancer ([place])   . Colon polyps   . Diverticulitis   . GERD (gastroesophageal reflux disease)   . Heart murmur   . Hemochromatosis   . HTN (hypertension)   . Hyperlipemia   . Meniere's disease   . Prostate cancer [facility])     Past Surgical History:  Procedure Laterality Date  . APPENDECTOMY    . BASAL CELL CARCINOMA EXCISION  2014   behind right knee  . PROSTATECTOMY    . tripple bypass N/A 12/07/2015    Family History  Problem Relation Age of Onset  . Colon cancer Mother   . Emphysema Father     Social History   Socioeconomic History  . Marital status: Married    Spouse name: Not on file  . Number of children: Not on file  . Years of education: Not on file  . Highest education level: Not on file  Occupational History  . Not on file  Social Needs  . Financial resource strain: Not on file  . Food insecurity:    Worry: Not on file    Inability: Not on file  . Transportation needs:    Medical: Not on file    Non-medical: Not on file  Tobacco Use  . Smoking status: Never Smoker  . Smokeless tobacco: Never Used  Substance and Sexual Activity  . Alcohol use: Yes    Alcohol/week:  0.0 standard drinks  . Drug use: No  . Sexual activity: Not on file  Lifestyle  . Physical activity:    Days per week: Not on file    Minutes per session: Not on file  . Stress: Not on file  Relationships  . Social connections:    Talks on phone: Not on file    Gets together: Not on file    Attends religious service: Not on file    Active member of club or organization: Not on file    Attends meetings of clubs or organizations: Not on file    Relationship status: Not on file  . Intimate partner violence:    Fear of current or ex partner: Not on file    Emotionally abused: Not on file    Physically abused: Not on file    Forced sexual activity:  Not on file  Other Topics Concern  . Not on file  Social History Narrative  . Not on file    Outpatient Medications Prior to Visit  Medication Sig Dispense Refill  . aspirin EC 81 MG tablet Take 81 mg by mouth daily.    . carvedilol (COREG) 6.25 MG tablet Take 1 tablet (6.25 mg total) by mouth 2 (two) times daily. 180 tablet 3  . clopidogrel (PLAVIX) 75 MG tablet     . losartan (COZAAR) 100 MG tablet Take 1 tablet (100 mg total) by mouth daily. 30 tablet 3  . meclizine (MEDI-MECLIZINE) 25 MG tablet Take 1 tablet (25 mg total) by mouth 3 (three) times daily as needed for dizziness. 30 tablet 0  . potassium chloride (K-DUR,KLOR-CON) 10 MEQ tablet     . rosuvastatin (CRESTOR) 20 MG tablet Take 1 tablet by mouth daily.    Marland Kitchen triamcinolone ointment (KENALOG) 0.1 % Apply 1 application topically 2 (two) times daily as needed. 30 g 0  . vitamin E 400 UNIT capsule Take 2 capsules by mouth daily.    . chlorthalidone (HYGROTON) 25 MG tablet Take by mouth.     No facility-administered medications prior to visit.     Allergies  Allergen Reactions  . Penicillin G Hives  . Atorvastatin Other (See Comments)    Causes muscle aches/cramps and tremors in fingers  . Iodinated Diagnostic Agents Hives    Years ago, 1 small hive on neck  . Omeprazole Rash    In high doses   . Shellfish Allergy Nausea Only, Rash and Other (See Comments)    Depends on the type of shellfish    ROS Review of Systems  Constitutional: Negative.   Respiratory: Negative.   Cardiovascular: Negative.   Gastrointestinal: Negative for abdominal pain, anal bleeding and blood in stool.  Psychiatric/Behavioral: Negative.       Objective:    Physical Exam  Constitutional: He is oriented to person, place, and time. He appears well-developed and well-nourished. No distress.  HENT:  Head: Normocephalic and atraumatic.  Right Ear: External ear normal.  Left Ear: External ear normal.  Eyes: Conjunctivae are normal. Right  eye exhibits no discharge. Left eye exhibits no discharge. No scleral icterus.  Neck: No JVD present. No tracheal deviation present.  Pulmonary/Chest: Effort normal. No stridor.  Neurological: He is alert and oriented to person, place, and time.  Skin: He is not diaphoretic.  Psychiatric: He has a normal mood and affect. His behavior is normal.    Ht 5' 11.5" (1.816 m)   BMI 29.35 kg/m  Wt Readings from Last 3 Encounters:  09/16/17 213 lb 6 oz (96.8 kg)  04/29/17 211 lb 4 oz (95.8 kg)  10/30/16 209 lb (94.8 kg)     Health Maintenance Due  Topic Date Due  . Hepatitis C Screening  [birth year]  . TETANUS/TDAP  01/25/1965  . PNA vac Low Risk Adult (1 of 2 - PCV13) 01/26/2011  . COLONOSCOPY  12/10/2016    There are no preventive care reminders to display for this patient.  Lab Results  Component Value Date   TSH 1.640 01/03/2015   Lab Results  Component Value Date   WBC 4.1 04/29/2017   HGB 13.9 04/29/2017   HCT 40.4 04/29/2017   MCV 102.9 (H) 04/29/2017   PLT 166.0 04/29/2017   Lab Results  Component Value Date   NA 140 08/01/2015   K 4.0 08/01/2015   CO2 27 08/01/2015   GLUCOSE 118 (H) 08/01/2015   BUN 17 08/01/2015   CREATININE 1.14 08/01/2015   BILITOT 0.6 08/01/2015   ALKPHOS 44 08/01/2015   AST 69 (H) 08/01/2015   ALT 95 (H) 08/01/2015   PROT 7.9 08/01/2015   ALBUMIN 4.3 08/01/2015   CALCIUM 9.7 08/01/2015   GFR 67.59 08/01/2015   Lab Results  Component Value Date   CHOL 226 (H) 08/01/2015   Lab Results  Component Value Date   HDL 42.00 08/01/2015   Lab Results  Component Value Date   LDLCALC 160 (H) 08/01/2015   Lab Results  Component Value Date   TRIG 116.0 08/01/2015   Lab Results  Component Value Date   CHOLHDL 5 08/01/2015   Lab Results  Component Value Date   HGBA1C 5.8 08/01/2015      Assessment & Plan:   Problem List Items Addressed This Visit      Digestive   Colon polyp - Primary   Relevant Orders   Ambulatory  referral to Gastroenterology      No orders of the defined types were placed in this encounter.   Follow-up: Return in about 1 year (around 06/04/2019), or if symptoms worsen or fail to improve.    Libby Maw, MD    Suggested that I would could be available to follow his blood pressure and cholesterol in the face of his history of hypertension and coronary artery disease.  Chart review shows that these are indeed stable for him.  Reviewed lab work drawn per [facility] in February of this year to show PSA of 0.01 normal CMP with an essentially normal CBC.  Follow-up will be in 1 year.

## 2018-06-04 NOTE — Progress Notes (Signed)
Virtual Visit via Video Note  I connected with Jeffrey Stein on 06/04/18 at 11:00 AM EDT by a video enabled telemedicine application and verified that I am speaking with the correct person using two identifiers.   I discussed the limitations of evaluation and management by telemedicine and the availability of in person appointments. The patient expressed understanding and agreed to proceed.  History of Present Illness:    Observations/Objective:   Assessment and Plan:   Follow Up Instructions:    I discussed the assessment and treatment plan with the patient. The patient was provided an opportunity to ask questions and all were answered. The patient agreed with the plan and demonstrated an understanding of the instructions.   The patient was advised to call back or seek an in-person evaluation if the symptoms worsen or if the condition fails to improve as anticipated.  I provided 15 minutes of non-face-to-face time during this encounter.

## 2018-06-16 DIAGNOSIS — C61 Malignant neoplasm of prostate: Secondary | ICD-10-CM | POA: Diagnosis not present

## 2018-07-17 ENCOUNTER — Other Ambulatory Visit: Payer: Self-pay

## 2018-07-18 ENCOUNTER — Encounter: Payer: Self-pay | Admitting: Gastroenterology

## 2018-07-18 ENCOUNTER — Ambulatory Visit (INDEPENDENT_AMBULATORY_CARE_PROVIDER_SITE_OTHER): Payer: Medicare HMO | Admitting: Gastroenterology

## 2018-07-18 ENCOUNTER — Other Ambulatory Visit: Payer: Self-pay

## 2018-07-18 VITALS — Ht 71.0 in | Wt 205.0 lb

## 2018-07-18 DIAGNOSIS — Z86010 Personal history of colonic polyps: Secondary | ICD-10-CM | POA: Diagnosis not present

## 2018-07-18 DIAGNOSIS — Z951 Presence of aortocoronary bypass graft: Secondary | ICD-10-CM | POA: Diagnosis not present

## 2018-07-18 DIAGNOSIS — I25810 Atherosclerosis of coronary artery bypass graft(s) without angina pectoris: Secondary | ICD-10-CM | POA: Diagnosis not present

## 2018-07-18 DIAGNOSIS — Z7902 Long term (current) use of antithrombotics/antiplatelets: Secondary | ICD-10-CM | POA: Diagnosis not present

## 2018-07-18 MED ORDER — NA SULFATE-K SULFATE-MG SULF 17.5-3.13-1.6 GM/177ML PO SOLN: 1.0000 | Freq: Once | ORAL | 0 refills | Status: AC

## 2018-07-18 NOTE — Progress Notes (Signed)
This patient contacted our office requesting a physician telemedicine consultation regarding clinical questions and/or test results.  Participants on the conference : myself and patient   The patient consented to this consultation and was aware that a charge will be placed through their insurance.  I was in my office and the patient was at home   Encounter time:  Total time 30 minutes, with 22 minutes spent with patient on doximity   _____________________________________________________________________________________________              [facility] Consult Note:  History: Jeffrey Stein 07/18/2018  Referring provider: Libby Maw, MD  Reason for consult/chief complaint: Colonoscopy (Plavix, hx of colon polyps )   Subjective  HPI: Patient was referred to [place] in September 2018 for history of colon polyps.  My recommendation at that time was that he was due for colonoscopy, but for him to visit [place] several months later when he would be about a year out from heart bypass surgery for able to be off Plavix.  He was planning to contact us after his cardiology follow-up but we have not seen him since then.  Colonoscopy report by Dr. Arta Silence in November 2015 notes diverticulosis and 10 polyps, all sent in same pathology jar.  The pathologist felt the majority of fragments were tubular adenoma, but there were also sessile serrated and hyperplastic polyp fragments.  Prior prostatectomy for cancer, then ,PSA elevation last year - got further radiation.  Cards - Dr. Marcello Moores Povsic with [facility]  -I reviewed his most recent office note from February 7.  ROS:  Review of Systems  No chest pain walking in neighborhood No dyspnea No abdominal pain  Past Medical History: Past Medical History:  Diagnosis Date  . Abnormal liver enzymes   . Allergic rhinitis   . Allergy   . CAD (coronary artery disease)   . Colon polyps   . Diverticulosis   . ED (erectile  dysfunction)   . GERD (gastroesophageal reflux disease)   . Heart murmur   . Hemochromatosis   . HTN (hypertension)   . Hyperlipemia   . Meniere's disease   . Prostate cancer ([place])   . Retinal detachment      Past Surgical History: Past Surgical History:  Procedure Laterality Date  . APPENDECTOMY    . BASAL CELL CARCINOMA EXCISION  2014   behind right knee  . PROSTATECTOMY    . tripple bypass N/A 12/07/2015     Family History: Family History  Problem Relation Age of Onset  . Colon cancer Mother   . Emphysema Father   . Colon cancer Brother   . Stomach cancer Neg Hx   . Pancreatic cancer Neg Hx   . Esophageal cancer Neg Hx     Social History: Social History   Socioeconomic History  . Marital status: Married    Spouse name: Not on file  . Number of children: Not on file  . Years of education: Not on file  . Highest education level: Not on file  Occupational History  . Not on file  Social Needs  . Financial resource strain: Not on file  . Food insecurity    Worry: Not on file    Inability: Not on file  . Transportation needs    Medical: Not on file    Non-medical: Not on file  Tobacco Use  . Smoking status: Never Smoker  . Smokeless tobacco: Never Used  Substance and Sexual Activity  . Alcohol use: Yes  Alcohol/week: 0.0 standard drinks  . Drug use: No  . Sexual activity: Not on file  Lifestyle  . Physical activity    Days per week: Not on file    Minutes per session: Not on file  . Stress: Not on file  Relationships  . Social Herbalist on phone: Not on file    Gets together: Not on file    Attends religious service: Not on file    Active member of club or organization: Not on file    Attends meetings of clubs or organizations: Not on file    Relationship status: Not on file  Other Topics Concern  . Not on file  Social History Narrative  . Not on file    Allergies: Allergies  Allergen Reactions  . Penicillin G Hives  .  Atorvastatin Other (See Comments)    Causes muscle aches/cramps and tremors in fingers  . Iodinated Diagnostic Agents Hives    Years ago, 1 small hive on neck  . Omeprazole Rash    In high doses   . Shellfish Allergy Nausea Only, Rash and Other (See Comments)    Depends on the type of shellfish    Outpatient Meds: Current Outpatient Medications  Medication Sig Dispense Refill  . aspirin EC 81 MG tablet Take 81 mg by mouth daily.    . carvedilol (COREG) 6.25 MG tablet Take 1 tablet (6.25 mg total) by mouth 2 (two) times daily. 180 tablet 3  . clopidogrel (PLAVIX) 75 MG tablet     . losartan (COZAAR) 100 MG tablet Take 1 tablet (100 mg total) by mouth daily. 30 tablet 3  . potassium chloride (K-DUR,KLOR-CON) 10 MEQ tablet     . rosuvastatin (CRESTOR) 20 MG tablet Take 1 tablet by mouth daily.    Marland Kitchen triamcinolone ointment (KENALOG) 0.1 % Apply 1 application topically 2 (two) times daily as needed. 30 g 0  . vitamin E 400 UNIT capsule Take 2 capsules by mouth daily.     No current facility-administered medications for this visit.       ___________________________________________________________________ Objective   Exam:  No exam-virtual visit  He is well-appearing   Radiologic Studies:  Cards note reports normal EF 2018  Assessment: Encounter Diagnoses  Name Primary?  . Personal history of colonic polyps Yes  . Antiplatelet or antithrombotic long-term use   . Coronary artery disease involving coronary bypass graft of native heart without angina pectoris   . S/P CABG (coronary artery bypass graft)     Overdue for polyp surveillance. He feels ready to proceed with colonoscopy.  Risks and benefits were reviewed and he was agreeable.  The benefits and risks of the planned procedure were described in detail with the patient or (when appropriate) their health care proxy.  Risks were outlined as including, but not limited to, bleeding, infection, perforation, adverse  medication reaction leading to cardiac or pulmonary decompensation.  The limitation of incomplete mucosal visualization was also discussed.  No guarantees or warranties were given.  [place] understands the small but real risk of cardiovascular event during the short period of time he is off Plavix.  He can stay on aspirin up to the day prior to procedure, but must be off Plavix 5 days prior.  Given the length of time since his bypass and how well he has done since, I suspect his cardiologist will be agreeable.  We will consult with them as a matter of course.  Plan:  My office  will contact [facility] to schedule a colonoscopy with me. They will also communicate with his cardiologist regarding Plavix.    Thank you for the courtesy of this consult.  Please call me with any questions or concerns.  [name]  CC: Referring provider noted above

## 2018-07-18 NOTE — Addendum Note (Signed)
Addended by: Elias Else on: 07/18/2018 10:55 AM   Modules accepted: Orders

## 2018-08-06 ENCOUNTER — Telehealth: Payer: Self-pay

## 2018-08-06 NOTE — Telephone Encounter (Signed)
See care-everywhere notes for cardiac clearance and instructions to hold Plavix. Pt has been notified and aware, he sates clear understanding and has no further questions.

## 2018-08-06 NOTE — Telephone Encounter (Signed)
    Telephone Encounter - Bonney Aid, [place] - 08/06/2018 3:34 PM EDT Maudie Mercury, My preference would be for him to hold the Plavix five days and stay on the baby ASA, but if the gastroenterologist thinks he may need to do excisions and would prefer for the patient to be off ASA as well that is alright. Would be best to resume both as soon as possible afterwards.   Electronically signed by Bonney Aid, PA at 08/06/2018 3:39 PM EDT  Back to top of Miscellaneous Notes Telephone Encounter - Saunders Revel, RN - 08/05/2018 2:25 PM EDT Request Cardiac/Dental Clearance/Letter : Cardiac clearance needed Reason for Clearance: Medication/Letter : Aspirin & Plavix Physician or DDS Name : Dr. Wilfrid Lund  Physician Number : [phone]  Physician Fax Number : [phone] Type of Procedure : Colonoscopy  Date of Procedure : 08/14/18  Electronically signed by Saunders Revel, RN at 08/05/2018 2:28 PM EDT

## 2018-08-11 ENCOUNTER — Encounter: Payer: Self-pay | Admitting: Gastroenterology

## 2018-08-12 ENCOUNTER — Telehealth: Payer: Self-pay | Admitting: Gastroenterology

## 2018-08-12 NOTE — Telephone Encounter (Signed)
I have reviewed recommendation from patient's cardiologist to remain on aspirin and hold plavix for 5 days prior to procedure.  - HD

## 2018-08-13 ENCOUNTER — Telehealth: Payer: Self-pay | Admitting: Gastroenterology

## 2018-08-13 NOTE — Telephone Encounter (Signed)

## 2018-08-14 ENCOUNTER — Telehealth: Payer: Self-pay | Admitting: Gastroenterology

## 2018-08-14 ENCOUNTER — Other Ambulatory Visit: Payer: Self-pay

## 2018-08-14 ENCOUNTER — Encounter: Payer: Self-pay | Admitting: Gastroenterology

## 2018-08-14 ENCOUNTER — Ambulatory Visit (AMBULATORY_SURGERY_CENTER): Payer: Medicare HMO | Admitting: Gastroenterology

## 2018-08-14 VITALS — BP 119/71 | HR 60 | Temp 97.7°F | Resp 18 | Ht 71.0 in | Wt 205.0 lb

## 2018-08-14 DIAGNOSIS — D120 Benign neoplasm of cecum: Secondary | ICD-10-CM | POA: Diagnosis not present

## 2018-08-14 DIAGNOSIS — Z86010 Personal history of colonic polyps: Secondary | ICD-10-CM

## 2018-08-14 DIAGNOSIS — D124 Benign neoplasm of descending colon: Secondary | ICD-10-CM | POA: Diagnosis not present

## 2018-08-14 DIAGNOSIS — D122 Benign neoplasm of ascending colon: Secondary | ICD-10-CM

## 2018-08-14 MED ORDER — SODIUM CHLORIDE 0.9 % IV SOLN: 500.0000 mL | Freq: Once | INTRAVENOUS | Status: AC

## 2018-08-14 NOTE — Op Note (Signed)
[facility] Patient Name: Quang Thorpe Procedure Date: 08/14/2018 1:31 PM MRN: [id number] Endoscopist: Elba Loletha Carrow , MD Age: 73 Referring MD:  Date of Birth: 1945/05/26 Gender: Male Account #: [id number] Procedure:                Colonoscopy Indications:              Surveillance: Personal history of adenomatous                            polyps on last colonoscopy 5 years ago (10 tubular                            adenomas in 2015 at outside practice) Medicines:                Monitored Anesthesia Care Procedure:                Pre-Anesthesia Assessment:                           - Prior to the procedure, a History and Physical                            was performed, and patient medications and                            allergies were reviewed. The patient's tolerance of                            previous anesthesia was also reviewed. The risks                            and benefits of the procedure and the sedation                            options and risks were discussed with the patient.                            All questions were answered, and informed consent                            was obtained. Prior Anticoagulants: The patient                            last took Plavix (clopidogrel) 5 days prior to the                            procedure (and remained on apsirin). ASA Grade                            Assessment: III - A patient with severe systemic                            disease. After reviewing the risks and benefits,  the patient was deemed in satisfactory condition to                            undergo the procedure.                           After obtaining informed consent, the colonoscope                            was passed under direct vision. Throughout the                            procedure, the patient's blood pressure, pulse, and                            oxygen saturations were monitored continuously.  The                            Colonoscope was introduced through the anus and                            advanced to the the cecum, identified by                            appendiceal orifice and ileocecal valve. The                            colonoscopy was somewhat difficult due to a                            redundant colon. The patient tolerated the                            procedure well. The quality of the bowel                            preparation was fair. The ileocecal valve,                            appendiceal orifice, and rectum were photographed. Scope In: 1:42:26 PM Scope Out: 2:18:34 PM Scope Withdrawal Time: 0 hours 32 minutes 52 seconds  Total Procedure Duration: 0 hours 36 minutes 8 seconds  Findings:                 The perianal and digital rectal examinations were                            normal.                           A 15 mm polyp was found in the cecum. The polyp was                            sessile, adjacent to the ICV. Area was  unsuccessfully injected with 4 mL saline for a lift                            polypectomy.Therefore, polypectomy was not                            performed.                           A 15-18 mm polyp was found in the proximal                            ascending colon. The polyp was sessile. It was not                            removed.                           A 8 mm polyp was found in the distal ascending                            colon. The polyp was pedunculated. The polyp was                            removed with a hot snare. Resection and retrieval                            were complete.                           A 5 mm polyp was found in the descending colon. The                            polyp was sessile. The polyp was removed with a                            cold snare. Resection and retrieval were complete.                           Multiple diverticula were found in the  left colon.                           The mucosa vascular pattern in the rectum was                            locally increased, consistent with radiation                            proctopathy.                           The exam was otherwise without abnormality. Complications:            No immediate complications. Estimated Blood Loss:     Estimated blood loss was minimal. Impression:               -  Preparation of the colon was fair.                           - One 15 mm polyp in the cecum. Treatment not                            successful.                           - One 15-18 mm polyp in the proximal ascending                            colon.                           - One 8 mm polyp in the distal ascending colon,                            removed with a hot snare. Resected and retrieved.                           - One 5 mm polyp in the descending colon, removed                            with a cold snare. Resected and retrieved.                           - Diverticulosis in the left colon.                           - Increased mucosa vascular pattern in the rectum.                           - The examination was otherwise normal. Recommendation:           - Patient has a contact number available for                            emergencies. The signs and symptoms of potential                            delayed complications were discussed with the                            patient. Return to normal activities tomorrow.                            Written discharge instructions were provided to the                            patient.                           - Resume previous diet.                           -  Resume Plavix (clopidogrel) at prior dose in 3                            days.                           - Await pathology results.                           - Repeat colonoscopy at appointment to be scheduled                            EMR of proximal colon polyps in  hospital outpatient                            endoscopy department. Henry L. Loletha Carrow, MD 08/14/2018 2:31:27 PM This report has been signed electronically.

## 2018-08-14 NOTE — Progress Notes (Signed)
Called to room to assist during endoscopic procedure.  Patient ID and intended procedure confirmed with present staff. Received instructions for my participation in the procedure from the performing physician.  

## 2018-08-14 NOTE — Progress Notes (Signed)
To PACU, VSS. Report to [email or web address] 

## 2018-08-14 NOTE — Telephone Encounter (Signed)
This patient had a colonoscopy with me earlier today.  He had 2 proximal colon polyps that require complex polypectomy in the hospital endoscopy lab.  Please schedule a colonoscopy in my August or September [place] outpatient block if that is convenient for the patient (he is expecting your call, and is agreeable to this plan.)  He would remain on aspirin, but stop Plavix 5 days prior to the procedure.  That was ready cleared for this procedure, so we do not need to check with his cardiologist again for the upcoming procedure.  His prep was suboptimal in some areas.  Please have him use a capful of MiraLAX twice daily for the 2 days leading up to prep day, then use either Suprep or Plenvu (which ever he took for this exam).

## 2018-08-14 NOTE — Progress Notes (Signed)
Temp-Nancy Megan Salon VS- Rica Mote

## 2018-08-14 NOTE — Patient Instructions (Addendum)
YOU HAD AN ENDOSCOPIC PROCEDURE TODAY AT [facility]:   Refer to the procedure report that was given to you for any specific questions about what was found during the examination.  If the procedure report does not answer your questions, please call your gastroenterologist to clarify.  If you requested that your care partner not be given the details of your procedure findings, then the procedure report has been included in a sealed envelope for you to review at your convenience later.  YOU SHOULD EXPECT: Some feelings of bloating in the abdomen. Passage of more gas than usual.  Walking can help get rid of the air that was put into your GI tract during the procedure and reduce the bloating. If you had a lower endoscopy (such as a colonoscopy or flexible sigmoidoscopy) you may notice spotting of blood in your stool or on the toilet paper. If you underwent a bowel prep for your procedure, you may not have a normal bowel movement for a few days.  Please Note:  You might notice some irritation and congestion in your nose or some drainage.  This is from the oxygen used during your procedure.  There is no need for concern and it should clear up in a day or so.  SYMPTOMS TO REPORT IMMEDIATELY:   Following lower endoscopy (colonoscopy or flexible sigmoidoscopy):  Excessive amounts of blood in the stool  Significant tenderness or worsening of abdominal pains  Swelling of the abdomen that is new, acute  Fever of 100F or higher  For urgent or emergent issues, a gastroenterologist can be reached at any hour by calling [phone].   DIET:  We do recommend a small meal at first, but then you may proceed to your regular diet.  Drink plenty of fluids but you should avoid alcoholic beverages for 24 hours.  MEDICATIONS: Continue present medications. Resume Plavix (clopidogrel) at prior dose in 3 days.  Follow up: Repeat colonoscopy at an appointment to be scheduled EMR of proximal colon  polyps in the hospital outpatient endoscopy department. Dr. Loletha Carrow' office nurse will call you to set up this appointment.  Please see handouts given to you by your recovery nurse.  ACTIVITY:  You should plan to take it easy for the rest of today and you should NOT DRIVE or use heavy machinery until tomorrow (because of the sedation medicines used during the test).    FOLLOW UP: Our staff will call the number listed on your records 48-72 hours following your procedure to check on you and address any questions or concerns that you may have regarding the information given to you following your procedure. If we do not reach you, we will leave a message.  We will attempt to reach you two times.  During this call, we will ask if you have developed any symptoms of COVID 19. If you develop any symptoms (ie: fever, flu-like symptoms, shortness of breath, cough etc.) before then, please call [phone].  If you test positive for Covid 19 in the 2 weeks post procedure, please call and report this information to [place].    If any biopsies were taken you will be contacted by phone or by letter within the next 1-3 weeks.  Please call us at [phone] if you have not heard about the biopsies in 3 weeks.   Thank you for allowing [place] to provide for your healthcare needs today.  SIGNATURES/CONFIDENTIALITY: You and/or your care partner have signed paperwork which will be entered into  your electronic medical record.  These signatures attest to the fact that that the information above on your After Visit Summary has been reviewed and is understood.  Full responsibility of the confidentiality of this discharge information lies with you and/or your care-partner.

## 2018-08-18 ENCOUNTER — Telehealth: Payer: Self-pay

## 2018-08-18 NOTE — Telephone Encounter (Signed)
  Follow up Call-  Call back number 08/14/2018  Post procedure Call Back phone  # [phone] cell  Permission to leave phone message Yes  Some recent data might be hidden     Patient questions:  Do you have a fever, pain , or abdominal swelling? No. Pain Score  0 *  Have you tolerated food without any problems? Yes.    Have you been able to return to your normal activities? Yes.    Do you have any questions about your discharge instructions: Diet   No. Medications  No. Follow up visit  No.  Do you have questions or concerns about your Care? No.  Actions: * If pain score is 4 or above: No action needed, pain <4. 1. Have you developed a fever since your procedure? no  2.   Have you had an respiratory symptoms (SOB or cough) since your procedure? no  3.   Have you tested positive for COVID 19 since your procedure no  4.   Have you had any family members/close contacts diagnosed with the COVID 19 since your procedure?  no   If yes to any of these questions please route to Joylene Kenith, RN and Alphonsa Gin, Therapist, sports.

## 2018-08-22 ENCOUNTER — Other Ambulatory Visit: Payer: Self-pay

## 2018-08-22 DIAGNOSIS — Z86010 Personal history of colonic polyps: Secondary | ICD-10-CM

## 2018-08-22 MED ORDER — NA SULFATE-K SULFATE-MG SULF 17.5-3.13-1.6 GM/177ML PO SOLN: ORAL | 0 refills | Status: DC

## 2018-08-22 NOTE — Addendum Note (Signed)
Addended by: Marlon Pel on: 08/22/2018 10:44 AM   Modules accepted: Orders

## 2018-08-26 ENCOUNTER — Encounter: Payer: Self-pay | Admitting: *Deleted

## 2018-08-26 NOTE — Telephone Encounter (Signed)
This patient is scheduled as follows:  09/10/2018 TELEPHONE previsit with RN at 1:00 pm  09/19/2018 COVID-19 screening at 9:40 am  09/23/2018 at [facility], 8:30 am   FOR THE PREVISIT NURSE (room 51): refer to Dr. Loletha Carrow' telephone encounter below on 08/14/2018.

## 2018-09-10 ENCOUNTER — Ambulatory Visit (AMBULATORY_SURGERY_CENTER): Payer: Self-pay | Admitting: *Deleted

## 2018-09-10 ENCOUNTER — Other Ambulatory Visit: Payer: Self-pay

## 2018-09-10 VITALS — Ht 71.0 in | Wt 210.0 lb

## 2018-09-10 DIAGNOSIS — Z86010 Personal history of colonic polyps: Secondary | ICD-10-CM

## 2018-09-10 NOTE — Progress Notes (Signed)
No egg or soy allergy known to patient  No issues with past sedation with any surgeries  or procedures, no intubation problems  No diet pills per patient No home 02 use per patient  No blood thinners per patient  Pt denies issues with constipation  No A fib or A flutter  Pt verified name, DOB, address and insurance during PV today. Pt mailed instruction packet to included paper to complete and mail back to [facility] with addressed and stamped envelope,  Copy of instructions.  PV completed over the phone. Pt encouraged to call with questions or issues    Pt. Stated "I already have the suprep,I just need to buy miralax and ducolax".

## 2018-09-12 DIAGNOSIS — I10 Essential (primary) hypertension: Secondary | ICD-10-CM | POA: Diagnosis not present

## 2018-09-12 DIAGNOSIS — E782 Mixed hyperlipidemia: Secondary | ICD-10-CM | POA: Diagnosis not present

## 2018-09-12 DIAGNOSIS — I4891 Unspecified atrial fibrillation: Secondary | ICD-10-CM | POA: Diagnosis not present

## 2018-09-12 DIAGNOSIS — I2510 Atherosclerotic heart disease of native coronary artery without angina pectoris: Secondary | ICD-10-CM | POA: Diagnosis not present

## 2018-09-12 DIAGNOSIS — I9789 Other postprocedural complications and disorders of the circulatory system, not elsewhere classified: Secondary | ICD-10-CM | POA: Diagnosis not present

## 2018-09-19 ENCOUNTER — Other Ambulatory Visit (HOSPITAL_COMMUNITY)
Admission: RE | Admit: 2018-09-19 | Discharge: 2018-09-19 | Disposition: A | Discharge status: Home / Self Care | Payer: Medicare HMO | Source: Ambulatory Visit | Attending: Gastroenterology | Admitting: Gastroenterology

## 2018-09-19 DIAGNOSIS — Z01812 Encounter for preprocedural laboratory examination: Secondary | ICD-10-CM | POA: Insufficient documentation

## 2018-09-19 DIAGNOSIS — Z20828 Contact with and (suspected) exposure to other viral communicable diseases: Secondary | ICD-10-CM | POA: Insufficient documentation

## 2018-09-19 LAB — SARS CORONAVIRUS 2 (TAT 6-24 HRS): SARS Coronavirus 2: NEGATIVE

## 2018-09-22 ENCOUNTER — Encounter (HOSPITAL_COMMUNITY): Payer: Self-pay | Admitting: *Deleted

## 2018-09-22 ENCOUNTER — Other Ambulatory Visit: Payer: Self-pay

## 2018-09-22 NOTE — Anesthesia Preprocedure Evaluation (Addendum)
Anesthesia Evaluation  Patient identified by MRN, date of birth, ID band Patient awake    Reviewed: Allergy & Precautions, NPO status , Patient's Chart, lab work & pertinent test results, reviewed documented beta blocker date and time   History of Anesthesia Complications Negative for: history of anesthetic complications  Airway Mallampati: II  TM Distance: >3 FB Neck ROM: Full    Dental  (+) Dental Advisory Given   Pulmonary neg pulmonary ROS,    Pulmonary exam normal        Cardiovascular hypertension, Pt. on home beta blockers and Pt. on medications (-) angina+ CAD and + CABG  Normal cardiovascular exam   '20 TTE (Care Everywhere) - NORMAL LEFT VENTRICULAR SYSTOLIC FUNCTION. NORMAL DIASTOLIC FUNCTION. NORMAL RIGHT VENTRICULAR SYSTOLIC FUNCTION. TRIVIAL MR, TRIVIAL PR, TRIVIAL TR. TRIVIAL AS  '17 Stress Test - Nuclear stress EF: 43%. There was no ST segment deviation noted during stress. Defect 1: There is a large defect of severe severity present in the basal inferolateral, mid inferolateral and apex location. Findings consistent with prior myocardial infarction with peri-infarct ischemia. This is an intermediate risk study. The left ventricular ejection fraction is moderately decreased (30-44%).     Neuro/Psych  Meniere's disease  negative psych ROS   GI/Hepatic Neg liver ROS, GERD  Controlled,  Endo/Other  negative endocrine ROS  Renal/GU negative Renal ROS    ED Prostate cancer     Musculoskeletal negative musculoskeletal ROS (+)   Abdominal   Peds  Hematology  (+) Blood dyscrasia, ,  Hemochromatosis    Anesthesia Other Findings   Reproductive/Obstetrics                            Anesthesia Physical Anesthesia Plan  ASA: III  Anesthesia Plan: MAC   Post-op Pain Management:    Induction: Intravenous  PONV Risk Score and Plan: 1 and Propofol infusion and  Treatment may vary due to age or medical condition  Airway Management Planned: Natural Airway and Simple Face Mask  Additional Equipment: None  Intra-op Plan:   Post-operative Plan:   Informed Consent: I have reviewed the patients History and Physical, chart, labs and discussed the procedure including the risks, benefits and alternatives for the proposed anesthesia with the patient or authorized representative who has indicated his/her understanding and acceptance.       Plan Discussed with: CRNA and Anesthesiologist  Anesthesia Plan Comments:        Anesthesia Quick Evaluation

## 2018-09-23 ENCOUNTER — Ambulatory Visit (HOSPITAL_COMMUNITY): Payer: Medicare HMO | Admitting: Anesthesiology

## 2018-09-23 ENCOUNTER — Other Ambulatory Visit: Payer: Self-pay

## 2018-09-23 ENCOUNTER — Encounter (HOSPITAL_COMMUNITY): Payer: Self-pay | Admitting: Registered Nurse

## 2018-09-23 ENCOUNTER — Encounter (HOSPITAL_COMMUNITY)
Admission: RE | Disposition: A | Discharge status: Home / Self Care | Payer: Self-pay | Source: Home / Self Care | Attending: Gastroenterology

## 2018-09-23 ENCOUNTER — Ambulatory Visit (HOSPITAL_COMMUNITY)
Admission: RE | Admit: 2018-09-23 | Discharge: 2018-09-23 | Disposition: A | Discharge status: Home / Self Care | Payer: Medicare HMO | Attending: Gastroenterology | Admitting: Gastroenterology

## 2018-09-23 DIAGNOSIS — K219 Gastro-esophageal reflux disease without esophagitis: Secondary | ICD-10-CM | POA: Insufficient documentation

## 2018-09-23 DIAGNOSIS — K635 Polyp of colon: Secondary | ICD-10-CM

## 2018-09-23 DIAGNOSIS — I2510 Atherosclerotic heart disease of native coronary artery without angina pectoris: Secondary | ICD-10-CM | POA: Insufficient documentation

## 2018-09-23 DIAGNOSIS — Z1211 Encounter for screening for malignant neoplasm of colon: Secondary | ICD-10-CM | POA: Diagnosis present

## 2018-09-23 DIAGNOSIS — E785 Hyperlipidemia, unspecified: Secondary | ICD-10-CM | POA: Insufficient documentation

## 2018-09-23 DIAGNOSIS — Z79899 Other long term (current) drug therapy: Secondary | ICD-10-CM | POA: Insufficient documentation

## 2018-09-23 DIAGNOSIS — K5730 Diverticulosis of large intestine without perforation or abscess without bleeding: Secondary | ICD-10-CM | POA: Insufficient documentation

## 2018-09-23 DIAGNOSIS — Z880 Allergy status to penicillin: Secondary | ICD-10-CM | POA: Diagnosis not present

## 2018-09-23 DIAGNOSIS — D122 Benign neoplasm of ascending colon: Secondary | ICD-10-CM | POA: Diagnosis not present

## 2018-09-23 DIAGNOSIS — I10 Essential (primary) hypertension: Secondary | ICD-10-CM | POA: Insufficient documentation

## 2018-09-23 DIAGNOSIS — Z860100 Personal history of colon polyps, unspecified: Secondary | ICD-10-CM

## 2018-09-23 DIAGNOSIS — Z85828 Personal history of other malignant neoplasm of skin: Secondary | ICD-10-CM | POA: Insufficient documentation

## 2018-09-23 DIAGNOSIS — Z9079 Acquired absence of other genital organ(s): Secondary | ICD-10-CM | POA: Insufficient documentation

## 2018-09-23 DIAGNOSIS — Z7902 Long term (current) use of antithrombotics/antiplatelets: Secondary | ICD-10-CM | POA: Insufficient documentation

## 2018-09-23 DIAGNOSIS — Z7982 Long term (current) use of aspirin: Secondary | ICD-10-CM | POA: Diagnosis not present

## 2018-09-23 DIAGNOSIS — C61 Malignant neoplasm of prostate: Secondary | ICD-10-CM | POA: Diagnosis not present

## 2018-09-23 DIAGNOSIS — D120 Benign neoplasm of cecum: Secondary | ICD-10-CM

## 2018-09-23 DIAGNOSIS — Z8546 Personal history of malignant neoplasm of prostate: Secondary | ICD-10-CM | POA: Insufficient documentation

## 2018-09-23 HISTORY — PX: SUBMUCOSAL LIFTING INJECTION: SHX6855

## 2018-09-23 HISTORY — PX: HOT HEMOSTASIS: SHX5433

## 2018-09-23 HISTORY — PX: POLYPECTOMY: SHX5525

## 2018-09-23 HISTORY — PX: COLONOSCOPY WITH PROPOFOL: SHX5780

## 2018-09-23 HISTORY — PX: HEMOSTASIS CLIP PLACEMENT: SHX6857

## 2018-09-23 SURGERY — COLONOSCOPY WITH PROPOFOL
Anesthesia: Monitor Anesthesia Care

## 2018-09-23 MED ORDER — PROPOFOL 10 MG/ML IV BOLUS
INTRAVENOUS | Status: AC
  Filled 2018-09-23: qty 60

## 2018-09-23 MED ORDER — PROPOFOL 10 MG/ML IV BOLUS
INTRAVENOUS | Status: DC | PRN
  Administered 2018-09-23: 30 mg via INTRAVENOUS

## 2018-09-23 MED ORDER — SODIUM CHLORIDE 0.9 % IV SOLN: INTRAVENOUS | Status: DC

## 2018-09-23 MED ORDER — LACTATED RINGERS IV SOLN
INTRAVENOUS | Status: DC
  Administered 2018-09-23: 1000 mL via INTRAVENOUS
  Administered 2018-09-23: 09:00:00 via INTRAVENOUS

## 2018-09-23 MED ORDER — PROPOFOL 500 MG/50ML IV EMUL
INTRAVENOUS | Status: DC | PRN
  Administered 2018-09-23: 150 ug/kg/min via INTRAVENOUS

## 2018-09-23 MED ORDER — LIDOCAINE 2% (20 MG/ML) 5 ML SYRINGE
INTRAMUSCULAR | Status: DC | PRN
  Administered 2018-09-23: 60 mg via INTRAVENOUS

## 2018-09-23 MED ORDER — PROPOFOL 10 MG/ML IV BOLUS
INTRAVENOUS | Status: AC
  Filled 2018-09-23: qty 40

## 2018-09-23 MED ORDER — EPHEDRINE SULFATE-NACL 50-0.9 MG/10ML-% IV SOSY
PREFILLED_SYRINGE | INTRAVENOUS | Status: DC | PRN
  Administered 2018-09-23 (×2): 20 mg via INTRAVENOUS

## 2018-09-23 MED ORDER — PHENYLEPHRINE 40 MCG/ML (10ML) SYRINGE FOR IV PUSH (FOR BLOOD PRESSURE SUPPORT)
PREFILLED_SYRINGE | INTRAVENOUS | Status: DC | PRN
  Administered 2018-09-23 (×4): 80 ug via INTRAVENOUS

## 2018-09-23 MED ORDER — PROPOFOL 10 MG/ML IV BOLUS
INTRAVENOUS | Status: AC
  Filled 2018-09-23: qty 20

## 2018-09-23 SURGICAL SUPPLY — 21 items

## 2018-09-23 NOTE — Anesthesia Postprocedure Evaluation (Signed)
Anesthesia Post Note  Patient: Jeffrey Stein  Procedure(s) Performed: COLONOSCOPY WITH PROPOFOL (N/A ) SUBMUCOSAL LIFTING INJECTION POLYPECTOMY HOT HEMOSTASIS (ARGON PLASMA COAGULATION/BICAP) (N/A ) HEMOSTASIS CLIP PLACEMENT     Patient location during evaluation: PACU Anesthesia Type: MAC Level of consciousness: awake and alert Pain management: pain level controlled Vital Signs Assessment: post-procedure vital signs reviewed and stable Respiratory status: spontaneous breathing, nonlabored ventilation and respiratory function stable Cardiovascular status: stable and blood pressure returned to baseline Anesthetic complications: no    Last Vitals:  Vitals:   09/23/18 0950 09/23/18 1000  BP: (!) 102/44 109/72  Pulse: 71 71  Resp: 18 (!) 22  Temp:    SpO2: 99% 98%    Last Pain:  Vitals:   09/23/18 1000  TempSrc:   PainSc: 0-No pain                 Audry Pili

## 2018-09-23 NOTE — Op Note (Signed)
[facility] Patient Name: Jeffrey Stein Procedure Date: 09/23/2018 MRN: [id number] Attending MD: Estill Cotta. Loletha Carrow , MD Date of Birth: Feb 25, 1945 CSN: [id number] Age: 73 Admit Type: Outpatient Procedure:                Colonoscopy Indications:              For therapy of adenomatous polyps in the colon Providers:                Mallie Mussel L. Loletha Carrow, MD, Carlyn Reichert, RN, Marguerita Merles, Technician Referring MD:              Medicines:                Monitored Anesthesia Care Complications:            No immediate complications. Estimated Blood Loss:     Estimated blood loss was minimal. Procedure:                Pre-Anesthesia Assessment:                           - Prior to the procedure, a History and Physical                            was performed, and patient medications and                            allergies were reviewed. The patient's tolerance of                            previous anesthesia was also reviewed. The risks                            and benefits of the procedure and the sedation                            options and risks were discussed with the patient.                            All questions were answered, and informed consent                            was obtained. Prior Anticoagulants: The patient has                            taken no previous anticoagulant or antiplatelet                            agents except for aspirin. ASA Grade Assessment: II                            - A patient with mild systemic disease. After  reviewing the risks and benefits, the patient was                            deemed in satisfactory condition to undergo the                            procedure.                           After obtaining informed consent, the colonoscope                            was passed under direct vision. Throughout the                            procedure, the patient's blood  pressure, pulse, and                            oxygen saturations were monitored continuously. The                            CF-HQ190L ([id number]) Olympus colonoscope was                            introduced through the anus and advanced to the the                            cecum, identified by appendiceal orifice and                            ileocecal valve. The colonoscopy was performed                            without difficulty. The patient tolerated the                            procedure well. The quality of the bowel                            preparation was good. The ileocecal valve,                            appendiceal orifice, and rectum were photographed. Scope In: 8:32:05 AM Scope Out: 9:34:27 AM Scope Withdrawal Time: 0 hours 58 minutes 43 seconds  Total Procedure Duration: 1 hour 2 minutes 22 seconds  Findings:      The perianal and digital rectal examinations were normal.      Multiple diverticula were found in the left colon.      A 15 mm polyp was found in the cecum. The polyp was sessile.       Preparations were made for mucosal resection. 15 cc of Orise gel was       injected to raise the lesion. Hot and cold piecemeal snare mucosal       resection was performed. Resection and retrieval were complete.       Coagulation  for hemostasis and ablation of remaining tissue using argon       plasma was successful.      A 18 mm polyp was found in the proximal ascending colon. The polyp was       semi-pedunculated. The polyp was removed en bloc with a hot snare (then       cut with snare for retrieval). Resection and retrieval were complete. To       prevent bleeding post-intervention, two hemostatic clips were       successfully placed (MR conditional). There was no bleeding during the       procedure.      A diminutive polyp was found in the distal ascending colon. The polyp       was sessile. The polyp was removed with a cold snare. Resection and       retrieval  were complete.      The mucosa vascular pattern in the rectum was locally increased,       consistent with radiation proctopathy.      Retroflexion in the rectum was not performed.      The exam was otherwise without abnormality. Impression:               - Diverticulosis in the left colon.                           - One 15 mm polyp in the cecum, removed with                            mucosal resection. Resected and retrieved. Treated                            with argon plasma coagulation (APC).                           - One 18 mm polyp in the proximal ascending colon,                            removed with a hot snare. Resected and retrieved.                            Clips (MR conditional) were placed.                           - Mucosal resection was performed. Resection and                            retrieval were complete. Moderate Sedation:      Not Applicable - Patient had care per Anesthesia. Recommendation:           - Patient has a contact number available for                            emergencies. The signs and symptoms of potential                            delayed complications were discussed with the  patient. Return to normal activities tomorrow.                            Written discharge instructions were provided to the                            patient.                           - Resume previous diet.                           - Continue present medications.                           - Await pathology results.                           - Repeat colonoscopy in 6 months for surveillance. Procedure Code(s):        --- Professional ---                           [phone], Colonoscopy, flexible; with endoscopic                            mucosal resection                           [id number], 49, Colonoscopy, flexible; with removal of                            tumor(s), polyp(s), or other lesion(s) by snare                             technique Diagnosis Code(s):        --- Professional ---                           K63.5, Polyp of colon                           D12.6, Benign neoplasm of colon, unspecified                           K57.30, Diverticulosis of large intestine without                            perforation or abscess without bleeding CPT copyright 2019 American Medical Association. All rights reserved. The codes documented in this report are preliminary and upon coder review may  be revised to meet current compliance requirements. Harold Mattes L. Loletha Carrow, MD 09/23/2018 9:50:46 AM This report has been signed electronically. Number of Addenda: 0

## 2018-09-23 NOTE — Transfer of Care (Signed)
Immediate Anesthesia Transfer of Care Note  Patient: Arihant Pennings  Procedure(s) Performed: COLONOSCOPY WITH PROPOFOL (N/A ) SUBMUCOSAL LIFTING INJECTION POLYPECTOMY HOT HEMOSTASIS (ARGON PLASMA COAGULATION/BICAP) (N/A ) HEMOSTASIS CLIP PLACEMENT  Patient Location: PACU  Anesthesia Type:MAC  Level of Consciousness: sedated  Airway & Oxygen Therapy: Patient Spontanous Breathing and Patient connected to face mask oxygen  Post-op Assessment: Report given to RN and Post -op Vital signs reviewed and stable  Post vital signs: Reviewed and stable  Last Vitals:  Vitals Value Taken Time  BP    Temp    Pulse    Resp    SpO2      Last Pain:  Vitals:   09/23/18 0719  TempSrc: Oral  PainSc: 0-No pain         Complications: No apparent anesthesia complications

## 2018-09-23 NOTE — Anesthesia Procedure Notes (Signed)
Date/Time: 09/23/2018 8:25 AM Performed by: Talbot Grumbling, CRNA Oxygen Delivery Method: Simple face mask

## 2018-09-23 NOTE — H&P (Signed)
History:  This patient presents for endoscopic testing for colon polyps requiring EMR.  [name] Referring physician: Libby Maw, MD  Past Medical History: Past Medical History:  Diagnosis Date  . Abnormal liver enzymes   . Allergic rhinitis   . Allergy   . CAD (coronary artery disease)   . Cataract   . Colon polyps   . Diverticulosis   . ED (erectile dysfunction)   . GERD (gastroesophageal reflux disease)   . Heart murmur   . Hemochromatosis   . HTN (hypertension)   . Hyperlipemia   . Meniere's disease   . Prostate cancer ([place])   . Retinal detachment      Past Surgical History: Past Surgical History:  Procedure Laterality Date  . APPENDECTOMY    . BASAL CELL CARCINOMA EXCISION  2014   behind right knee and back  . cataracts     . COLONOSCOPY    . POLYPECTOMY    . PROSTATECTOMY    . RETINAL DETACHMENT SURGERY    . tripple bypass N/A 12/07/2015    Allergies: Allergies  Allergen Reactions  . Penicillin G Hives    Did it involve swelling of the face/tongue/throat, SOB, or low BP? Unknown Did it involve sudden or severe rash/hives, skin peeling, or any reaction on the inside of your mouth or nose? Unknown Did you need to seek medical attention at a hospital or doctor's office? No When did it last happen?Occurred when he was a teenager If all above answers are "NO", may proceed with cephalosporin use.    . Atorvastatin Other (See Comments)    Causes muscle aches/cramps and tremors in fingers  . Iodinated Diagnostic Agents Hives    Years ago, 1 small hive on neck  . Omeprazole Rash    In high doses   . Shellfish Allergy Nausea Only, Rash and Other (See Comments)    Depends on the type of shellfish    Outpatient Meds: Current Facility-Administered Medications  Medication Dose Route Frequency Provider Last Rate Last Dose  . 0.9 %  sodium chloride infusion   Intravenous Continuous Danis, Estill Cotta III, MD      . lactated ringers infusion    Intravenous Continuous [name], MD 10 mL/hr at 09/23/18 0720 1,000 mL at 09/23/18 0720      ___________________________________________________________________ Objective   Exam:  BP 140/86   Pulse 87   Temp (!) 97.1 F (36.2 C) (Oral)   Resp 20   SpO2 98%    CV: RRR without murmur, S1/S2, no JVD, no peripheral edema  Resp: clear to auscultation bilaterally, normal RR and effort noted  GI: soft, no tenderness, with active bowel sounds. No guarding or palpable organomegaly noted.  Neuro: awake, alert and oriented x 3. Normal gross motor function and fluent speech   Assessment:  Colon polyps  Plan:  Colonoscopy with EMR of right colon polyps   [name]

## 2018-09-23 NOTE — Interval H&P Note (Signed)
History and Physical Interval Note:  09/23/2018 8:14 AM  Jeffrey Stein  has presented today for surgery, with the diagnosis of colon polyps.  The various methods of treatment have been discussed with the patient and family. After consideration of risks, benefits and other options for treatment, the patient has consented to  Procedure(s): COLONOSCOPY WITH PROPOFOL (N/A) as a surgical intervention.  The patient's history has been reviewed, patient examined, no change in status, stable for surgery.  I have reviewed the patient's chart and labs.  Questions were answered to the patient's satisfaction.     [name]

## 2018-09-24 ENCOUNTER — Encounter (HOSPITAL_COMMUNITY): Payer: Self-pay | Admitting: Gastroenterology

## 2018-09-26 ENCOUNTER — Encounter: Payer: Self-pay | Admitting: Gastroenterology

## 2018-10-07 NOTE — Progress Notes (Signed)
Virtual Visit via Video Note  I connected with patient on  at 11:00 AM EDT by audio enabled telemedicine application and verified that I am speaking with the correct person using two identifiers.   THIS ENCOUNTER IS A VIRTUAL VISIT DUE TO COVID-19 - PATIENT WAS NOT SEEN IN THE OFFICE. PATIENT HAS CONSENTED TO VIRTUAL VISIT / TELEMEDICINE VISIT   Location of patient: home  Location of provider: office  I discussed the limitations of evaluation and management by telemedicine and the availability of in person appointments. The patient expressed understanding and agreed to proceed.   Subjective:   Jeffrey Stein is a 73 y.o. male who presents for Medicare Annual/Subsequent preventive examination.  Enjoys a lot of reading and stays social w/ friends. Looking forward to 2 week cruise next yr to [place] and [place].  Review of Systems:  Cardiac Risk Factors include: advanced age (>58men, >33 women);dyslipidemia;hypertension;male gender Home Safety/Smoke Alarms: Feels safe in home. Smoke alarms in place.  Lives with wife in 2 story home. No issue w/ stairs. All stairs have hand rails. Walk-in shower.   Male:   CCS- 09/23/18. Recall 6 months.     PSA- No results found for: PSA      Objective:    Vitals: Unable to assess. This visit is enabled though telemedicine due to Covid 19.   Advanced Directives 10/08/2018 09/23/2018 09/22/2018  Does Patient Have a Medical Advance Directive? Yes Yes Yes  Type of Paramedic of [place];Living will Healthcare Power of [place];Living will  Does patient want to make changes to medical advance directive? No - Patient declined - -  Copy of [place] in Chart? Yes - validated most recent copy scanned in chart (See row information) No - copy requested -    Tobacco Social History   Tobacco Use  Smoking Status Never Smoker  Smokeless Tobacco Never Used     Counseling given: Not  Answered   Clinical Intake:     Pain : No/denies pain                 Past Medical History:  Diagnosis Date  . Abnormal liver enzymes   . Allergic rhinitis   . Allergy   . CAD (coronary artery disease)   . Cataract   . Colon polyps   . Diverticulosis   . ED (erectile dysfunction)   . GERD (gastroesophageal reflux disease)   . Heart murmur   . Hemochromatosis   . HTN (hypertension)   . Hyperlipemia   . Meniere's disease   . Prostate cancer ([place])   . Retinal detachment    Past Surgical History:  Procedure Laterality Date  . APPENDECTOMY    . BASAL CELL CARCINOMA EXCISION  2014   behind right knee and back  . cataracts     . COLONOSCOPY    . COLONOSCOPY WITH PROPOFOL N/A 09/23/2018   Procedure: COLONOSCOPY WITH PROPOFOL;  Surgeon: Doran Stabler, MD;  Location: [facility];  Service: Gastroenterology;  Laterality: N/A;  . HEMOSTASIS CLIP PLACEMENT  09/23/2018   Procedure: HEMOSTASIS CLIP PLACEMENT;  Surgeon: Doran Stabler, MD;  Location: [facility];  Service: Gastroenterology;;  . HOT HEMOSTASIS N/A 09/23/2018   Procedure: HOT HEMOSTASIS (ARGON PLASMA COAGULATION/BICAP);  Surgeon: Doran Stabler, MD;  Location: [facility];  Service: Gastroenterology;  Laterality: N/A;  . POLYPECTOMY    . POLYPECTOMY  09/23/2018   Procedure: POLYPECTOMY;  Surgeon: Wilfrid Lund  L III, MD;  Location: [facility];  Service: Gastroenterology;;  . PROSTATECTOMY    . RETINAL DETACHMENT SURGERY    . SUBMUCOSAL LIFTING INJECTION  09/23/2018   Procedure: SUBMUCOSAL LIFTING INJECTION;  Surgeon: Doran Stabler, MD;  Location: [facility];  Service: Gastroenterology;;  . tripple bypass N/A 12/07/2015   Family History  Problem Relation Age of Onset  . Colon cancer Mother   . Emphysema Father   . Colon cancer Brother   . Stomach cancer Neg Hx   . Pancreatic cancer Neg Hx   . Esophageal cancer Neg Hx   . Rectal cancer Neg Hx    Social History   Socioeconomic  History  . Marital status: Married    Spouse name: Not on file  . Number of children: Not on file  . Years of education: Not on file  . Highest education level: Not on file  Occupational History  . Not on file  Social Needs  . Financial resource strain: Not on file  . Food insecurity    Worry: Not on file    Inability: Not on file  . Transportation needs    Medical: Not on file    Non-medical: Not on file  Tobacco Use  . Smoking status: Never Smoker  . Smokeless tobacco: Never Used  Substance and Sexual Activity  . Alcohol use: Yes    Comment: 2 cocktails per day  . Drug use: No  . Sexual activity: Not on file  Lifestyle  . Physical activity    Days per week: Not on file    Minutes per session: Not on file  . Stress: Not on file  Relationships  . Social Herbalist on phone: Not on file    Gets together: Not on file    Attends religious service: Not on file    Active member of club or organization: Not on file    Attends meetings of clubs or organizations: Not on file    Relationship status: Not on file  Other Topics Concern  . Not on file  Social History Narrative  . Not on file    Outpatient Encounter Medications as of 10/08/2018  Medication Sig  . aspirin EC 81 MG tablet Take 81 mg by mouth daily.  . carvedilol (COREG) 6.25 MG tablet Take 1 tablet (6.25 mg total) by mouth 2 (two) times daily.  . chlorthalidone (HYGROTON) 25 MG tablet Take 25 mg by mouth daily.   Marland Kitchen losartan (COZAAR) 100 MG tablet Take 1 tablet (100 mg total) by mouth daily. (Patient taking differently: Take 100 mg by mouth every evening. )  . Polyethyl Glycol-Propyl Glycol (LUBRICANT EYE DROPS) 0.4-0.3 % SOLN Place 1 drop into both eyes 3 (three) times daily as needed (dry/irritated eyes.).  Marland Kitchen potassium chloride (K-DUR,KLOR-CON) 10 MEQ tablet Take 10 mEq by mouth daily.   . rosuvastatin (CRESTOR) 20 MG tablet Take 20 mg by mouth every evening.   . vitamin E 400 UNIT capsule Take 400 Units  by mouth daily.   . [DISCONTINUED] clopidogrel (PLAVIX) 75 MG tablet Take 75 mg by mouth daily.   . [DISCONTINUED] Na Sulfate-K Sulfate-Mg Sulf 17.5-3.13-1.6 GM/177ML SOLN Take as directed   Facility-Administered Encounter Medications as of 10/08/2018  Medication  . 0.9 %  sodium chloride infusion    Activities of Daily Living In your present state of health, do you have any difficulty performing the following activities: 10/08/2018  Hearing? N  Comment wearing hearing aids.  Vision? N  Comment hx cataract sx. Wears glasses for reading.  Difficulty concentrating or making decisions? N  Walking or climbing stairs? N  Dressing or bathing? N  Doing errands, shopping? N  Preparing Food and eating ? N  Using the Toilet? N  In the past six months, have you accidently leaked urine? N  Do you have problems with loss of bowel control? N  Managing your Medications? N  Managing your Finances? N  Housekeeping or managing your Housekeeping? N  Some recent data might be hidden    Patient Care Team: Libby Maw, MD as PCP - General (Family Medicine)   Assessment:   This is a routine wellness examination for Jeromey. Physical assessment deferred to PCP.  Exercise Activities and Dietary recommendations Current Exercise Habits: Home exercise routine, Type of exercise: walking, Time (Minutes): 30, Frequency (Times/Week): 3, Weekly Exercise (Minutes/Week): 90, Intensity: Mild, Exercise limited by: None identified   Diet (meal preparation, eat out, water intake, caffeinated beverages, dairy products, fruits and vegetables): 24 hr recall Breakfast: mixed fruit. Lunch: protein shake and bar. Dinner:steak and potatoes.      Goals    . Continue healthy diet and exercise       Fall Risk Fall Risk  10/08/2018 10/30/2016 07/21/2015  Falls in the past year? 0 Yes No  Number falls in past yr: - 1 -  Injury with Fall? - No -   Depression Screen PHQ 2/9 Scores 10/08/2018 10/30/2016 07/21/2015  PHQ  - 2 Score 0 0 0    Cognitive Function       6CIT Screen 10/08/2018  What Year? 0 points  What month? 0 points  What time? 0 points  Count back from 20 0 points  Months in reverse 0 points  Repeat phrase 0 points  Total Score 0    Immunization History  Administered Date(s) Administered  . Influenza, High Dose Seasonal PF 10/30/2016    Screening Tests Health Maintenance  Topic Date Due  . Hepatitis C Screening  [birth year]  . TETANUS/TDAP  01/25/1965  . PNA vac Low Risk Adult (1 of 2 - PCV13) 01/26/2011  . INFLUENZA VACCINE  09/06/2018  . COLONOSCOPY  09/22/2021       Plan:   See you next year!  Continue to eat heart healthy diet (full of fruits, vegetables, whole grains, lean protein, water--limit salt, fat, and sugar intake) and increase physical activity as tolerated.  Continue doing brain stimulating activities (puzzles, reading, adult coloring books, staying active) to keep memory sharp.   Bring a copy of your living will and/or healthcare power of attorney to your next office visit.   I have personally reviewed and noted the following in the patient's chart:   . Medical and social history . Use of alcohol, tobacco or illicit drugs  . Current medications and supplements . Functional ability and status . Nutritional status . Physical activity . Advanced directives . List of other physicians . Hospitalizations, surgeries, and ER visits in previous 12 months . Vitals . Screenings to include cognitive, depression, and falls . Referrals and appointments  In addition, I have reviewed and discussed with patient certain preventive protocols, quality metrics, and best practice recommendations. A written personalized care plan for preventive services as well as general preventive health recommendations were provided to patient.     Shela Nevin, [place]  10/08/2018

## 2018-10-08 ENCOUNTER — Ambulatory Visit (INDEPENDENT_AMBULATORY_CARE_PROVIDER_SITE_OTHER): Payer: Medicare HMO | Admitting: *Deleted

## 2018-10-08 ENCOUNTER — Encounter: Payer: Self-pay | Admitting: *Deleted

## 2018-10-08 VITALS — BP 117/79

## 2018-10-08 DIAGNOSIS — Z0000 Encounter for general adult medical examination without abnormal findings: Secondary | ICD-10-CM | POA: Diagnosis not present

## 2018-10-08 NOTE — Patient Instructions (Signed)
See you next year!  Continue to eat heart healthy diet (full of fruits, vegetables, whole grains, lean protein, water--limit salt, fat, and sugar intake) and increase physical activity as tolerated.  Continue doing brain stimulating activities (puzzles, reading, adult coloring books, staying active) to keep memory sharp.   Bring a copy of your living will and/or healthcare power of attorney to your next office visit.   Jeffrey Stein , Thank you for taking time to come for your Medicare Wellness Visit. I appreciate your ongoing commitment to your health goals. Please review the following plan we discussed and let me know if I can assist you in the future.   These are the goals we discussed: Goals    . Continue healthy diet and exercise       This is a list of the screening recommended for you and due dates:  Health Maintenance  Topic Date Due  .  Hepatitis C: One time screening is recommended by Center for Disease Control  (CDC) for  adults born from [birth year] through 1965.   1945-09-20  . Tetanus Vaccine  01/25/1965  . Pneumonia vaccines (1 of 2 - PCV13) 01/26/2011  . Flu Shot  09/06/2018  . Colon Cancer Screening  09/22/2021    Health Maintenance After Age 71 After age 16, you are at a higher risk for certain long-term diseases and infections as well as injuries from falls. Falls are a major cause of broken bones and head injuries in people who are older than age 45. Getting regular preventive care can help to keep you healthy and well. Preventive care includes getting regular testing and making lifestyle changes as recommended by your health care provider. Talk with your health care provider about:  Which screenings and tests you should have. A screening is a test that checks for a disease when you have no symptoms.  A diet and exercise plan that is right for you. What should I know about screenings and tests to prevent falls? Screening and testing are the best ways to find a health  problem early. Early diagnosis and treatment give you the best chance of managing medical conditions that are common after age 73. Certain conditions and lifestyle choices may make you more likely to have a fall. Your health care provider may recommend:  Regular vision checks. Poor vision and conditions such as cataracts can make you more likely to have a fall. If you wear glasses, make sure to get your prescription updated if your vision changes.  Medicine review. Work with your health care provider to regularly review all of the medicines you are taking, including over-the-counter medicines. Ask your health care provider about any side effects that may make you more likely to have a fall. Tell your health care provider if any medicines that you take make you feel dizzy or sleepy.  Osteoporosis screening. Osteoporosis is a condition that causes the bones to get weaker. This can make the bones weak and cause them to break more easily.  Blood pressure screening. Blood pressure changes and medicines to control blood pressure can make you feel dizzy.  Strength and balance checks. Your health care provider may recommend certain tests to check your strength and balance while standing, walking, or changing positions.  Foot health exam. Foot pain and numbness, as well as not wearing proper footwear, can make you more likely to have a fall.  Depression screening. You may be more likely to have a fall if you have a fear of  falling, feel emotionally low, or feel unable to do activities that you used to do.  Alcohol use screening. Using too much alcohol can affect your balance and may make you more likely to have a fall. What actions can I take to lower my risk of falls? General instructions  Talk with your health care provider about your risks for falling. Tell your health care provider if: ? You fall. Be sure to tell your health care provider about all falls, even ones that seem minor. ? You feel dizzy,  sleepy, or off-balance.  Take over-the-counter and prescription medicines only as told by your health care provider. These include any supplements.  Eat a healthy diet and maintain a healthy weight. A healthy diet includes low-fat dairy products, low-fat (lean) meats, and fiber from whole grains, beans, and lots of fruits and vegetables. Home safety  Remove any tripping hazards, such as rugs, cords, and clutter.  Install safety equipment such as grab bars in bathrooms and safety rails on stairs.  Keep rooms and walkways well-lit. Activity   Follow a regular exercise program to stay fit. This will help you maintain your balance. Ask your health care provider what types of exercise are appropriate for you.  If you need a cane or walker, use it as recommended by your health care provider.  Wear supportive shoes that have nonskid soles. Lifestyle  Do not drink alcohol if your health care provider tells you not to drink.  If you drink alcohol, limit how much you have: ? 0-1 drink a day for women. ? 0-2 drinks a day for men.  Be aware of how much alcohol is in your drink. In the [place] one drink equals one typical bottle of beer (12 oz), one-half glass of wine (5 oz), or one shot of hard liquor (1 oz).  Do not use any products that contain nicotine or tobacco, such as cigarettes and e-cigarettes. If you need help quitting, ask your health care provider. Summary  Having a healthy lifestyle and getting preventive care can help to protect your health and wellness after age 52.  Screening and testing are the best way to find a health problem early and help you avoid having a fall. Early diagnosis and treatment give you the best chance for managing medical conditions that are more common for people who are older than age 20.  Falls are a major cause of broken bones and head injuries in people who are older than age 68. Take precautions to prevent a fall at home.  Work with your health  care provider to learn what changes you can make to improve your health and wellness and to prevent falls. This information is not intended to replace advice given to you by your health care provider. Make sure you discuss any questions you have with your health care provider. Document Released: 12/05/2016 Document Revised: 05/15/2018 Document Reviewed: 12/05/2016 Elsevier Patient Education  2020 [employer].

## 2018-10-09 DIAGNOSIS — L570 Actinic keratosis: Secondary | ICD-10-CM | POA: Diagnosis not present

## 2018-10-09 DIAGNOSIS — D485 Neoplasm of uncertain behavior of skin: Secondary | ICD-10-CM | POA: Diagnosis not present

## 2018-10-09 DIAGNOSIS — Z85828 Personal history of other malignant neoplasm of skin: Secondary | ICD-10-CM | POA: Diagnosis not present

## 2018-10-09 DIAGNOSIS — D1801 Hemangioma of skin and subcutaneous tissue: Secondary | ICD-10-CM | POA: Diagnosis not present

## 2018-10-09 DIAGNOSIS — D2372 Other benign neoplasm of skin of left lower limb, including hip: Secondary | ICD-10-CM | POA: Diagnosis not present

## 2018-10-09 DIAGNOSIS — C4441 Basal cell carcinoma of skin of scalp and neck: Secondary | ICD-10-CM | POA: Diagnosis not present

## 2018-10-09 DIAGNOSIS — L081 Erythrasma: Secondary | ICD-10-CM | POA: Diagnosis not present

## 2018-10-09 DIAGNOSIS — L821 Other seborrheic keratosis: Secondary | ICD-10-CM | POA: Diagnosis not present

## 2018-10-14 DIAGNOSIS — C61 Malignant neoplasm of prostate: Secondary | ICD-10-CM | POA: Diagnosis not present

## 2018-10-15 ENCOUNTER — Ambulatory Visit (INDEPENDENT_AMBULATORY_CARE_PROVIDER_SITE_OTHER): Payer: Medicare HMO | Admitting: Behavioral Health

## 2018-10-15 ENCOUNTER — Encounter: Payer: Self-pay | Admitting: Family Medicine

## 2018-10-15 ENCOUNTER — Other Ambulatory Visit: Payer: Self-pay

## 2018-10-15 ENCOUNTER — Other Ambulatory Visit (INDEPENDENT_AMBULATORY_CARE_PROVIDER_SITE_OTHER): Payer: Medicare HMO

## 2018-10-15 DIAGNOSIS — Z1159 Encounter for screening for other viral diseases: Secondary | ICD-10-CM

## 2018-10-15 DIAGNOSIS — Z23 Encounter for immunization: Secondary | ICD-10-CM

## 2018-10-15 NOTE — Progress Notes (Signed)
Patient presents in clinic today for Influenza vaccination. IM injection was given in the left deltoid. Patient tolerated the injection well. No signs or symptoms of a reaction were noted prior to patient leaving the nurse visit. 

## 2018-10-16 LAB — HEPATITIS C ANTIBODY
Hepatitis C Ab: NONREACTIVE
SIGNAL TO CUT-OFF: 0.04 (ref <1.00)

## 2019-03-01 ENCOUNTER — Ambulatory Visit: Payer: Medicare HMO | Attending: Internal Medicine

## 2019-03-01 DIAGNOSIS — Z23 Encounter for immunization: Secondary | ICD-10-CM

## 2019-03-01 NOTE — Progress Notes (Signed)
   Covid-19 Vaccination Clinic  Name:  Jacquez Stolarz    MRN: [id number] DOB: [birth year]  03/01/2019  Mr. Chaires was observed post Covid-19 immunization for 15 minutes without incidence. He was provided with Vaccine Information Sheet and instruction to access the V-Safe system.   Mr. Liquori was instructed to call 911 with any severe reactions post vaccine: Marland Kitchen Difficulty breathing  . Swelling of your face and throat  . A fast heartbeat  . A bad rash all over your body  . Dizziness and weakness    Immunizations Administered    Name Date Dose VIS Date Route   Pfizer COVID-19 Vaccine 03/01/2019 11:08 AM 0.3 mL 01/16/2019 Intramuscular   Manufacturer: [place]   Lot: [id number]   [place]: [id number]

## 2019-03-03 ENCOUNTER — Ambulatory Visit: Payer: Medicare HMO

## 2019-03-07 ENCOUNTER — Ambulatory Visit: Payer: Medicare HMO

## 2019-03-12 DIAGNOSIS — H43811 Vitreous degeneration, right eye: Secondary | ICD-10-CM | POA: Diagnosis not present

## 2019-03-12 DIAGNOSIS — H33022 Retinal detachment with multiple breaks, left eye: Secondary | ICD-10-CM | POA: Diagnosis not present

## 2019-03-12 DIAGNOSIS — H35373 Puckering of macula, bilateral: Secondary | ICD-10-CM | POA: Diagnosis not present

## 2019-03-12 DIAGNOSIS — H33311 Horseshoe tear of retina without detachment, right eye: Secondary | ICD-10-CM | POA: Diagnosis not present

## 2019-03-12 DIAGNOSIS — H04123 Dry eye syndrome of bilateral lacrimal glands: Secondary | ICD-10-CM | POA: Diagnosis not present

## 2019-03-20 DIAGNOSIS — I10 Essential (primary) hypertension: Secondary | ICD-10-CM | POA: Diagnosis not present

## 2019-03-20 DIAGNOSIS — I2510 Atherosclerotic heart disease of native coronary artery without angina pectoris: Secondary | ICD-10-CM | POA: Diagnosis not present

## 2019-03-20 DIAGNOSIS — I350 Nonrheumatic aortic (valve) stenosis: Secondary | ICD-10-CM | POA: Diagnosis not present

## 2019-03-20 DIAGNOSIS — E782 Mixed hyperlipidemia: Secondary | ICD-10-CM | POA: Diagnosis not present

## 2019-03-20 DIAGNOSIS — I712 Thoracic aortic aneurysm, without rupture: Secondary | ICD-10-CM | POA: Diagnosis not present

## 2019-03-23 ENCOUNTER — Ambulatory Visit: Payer: Medicare HMO | Attending: Internal Medicine

## 2019-03-23 DIAGNOSIS — Z23 Encounter for immunization: Secondary | ICD-10-CM | POA: Insufficient documentation

## 2019-03-23 NOTE — Progress Notes (Signed)
   Covid-19 Vaccination Clinic  Name:  Jeffrey Stein    MRN: [id number] DOB: 05-21-1945  03/23/2019  Mr. Jeffrey Stein was observed post Covid-19 immunization for 15 minutes without incidence. He was provided with Vaccine Information Sheet and instruction to access the V-Safe system.   Mr. Jeffrey Stein was instructed to call 911 with any severe reactions post vaccine: Marland Kitchen Difficulty breathing  . Swelling of your face and throat  . A fast heartbeat  . A bad rash all over your body  . Dizziness and weakness    Immunizations Administered    Name Date Dose VIS Date Route   Pfizer COVID-19 Vaccine 03/23/2019  8:22 AM 0.3 mL 01/16/2019 Intramuscular   Manufacturer: [place]   Lot: [id number]   [place]: [id number]

## 2019-03-28 ENCOUNTER — Ambulatory Visit: Payer: Medicare HMO

## 2019-04-13 DIAGNOSIS — C61 Malignant neoplasm of prostate: Secondary | ICD-10-CM | POA: Diagnosis not present

## 2019-09-08 ENCOUNTER — Telehealth: Payer: Self-pay | Admitting: Gastroenterology

## 2019-09-08 NOTE — Telephone Encounter (Signed)
Pt is requesting a call back from a nurse to discuss the colonoscopy procedure, pt states he needs to have this done at the hospital

## 2019-09-09 NOTE — Telephone Encounter (Signed)
Yes, he needs a surveillance colonoscopy in the [facility] endoscopy lab for Hx colon polyps in 2020.  Suprep or Plenvu.  Can go in next available [facility] slot.  Next week if anything left open and if convenient for patient.  Otherwise on my September [facility] date.  - HD

## 2019-09-09 NOTE — Telephone Encounter (Signed)
Pt called to schedule his colonoscopy. Per pt he needs the procedure done at the hospital. States he was supposed to have the colon in Feb but was not notified by the office. Please advise.

## 2019-09-10 NOTE — Telephone Encounter (Signed)
Pt is requesting a call back from Linda 

## 2019-09-10 NOTE — Telephone Encounter (Signed)
Left message for pt to call back  °

## 2019-09-11 ENCOUNTER — Other Ambulatory Visit: Payer: Self-pay

## 2019-09-11 DIAGNOSIS — Z86010 Personal history of colonic polyps: Secondary | ICD-10-CM

## 2019-09-11 NOTE — Telephone Encounter (Signed)
Pt scheduled for previsit 10/20/19@4 :30pm, Covid screen scheduled for 10/22/19@9 :10am, Colon scheduled at [facility] 10/26/19@9 :30am, pt to arrive at [facility] at 8am. Pt aware of appts.

## 2019-09-18 DIAGNOSIS — I9789 Other postprocedural complications and disorders of the circulatory system, not elsewhere classified: Secondary | ICD-10-CM | POA: Diagnosis not present

## 2019-09-18 DIAGNOSIS — I4891 Unspecified atrial fibrillation: Secondary | ICD-10-CM | POA: Diagnosis not present

## 2019-09-18 DIAGNOSIS — E782 Mixed hyperlipidemia: Secondary | ICD-10-CM | POA: Diagnosis not present

## 2019-09-18 DIAGNOSIS — I10 Essential (primary) hypertension: Secondary | ICD-10-CM | POA: Diagnosis not present

## 2019-09-18 DIAGNOSIS — I2510 Atherosclerotic heart disease of native coronary artery without angina pectoris: Secondary | ICD-10-CM | POA: Diagnosis not present

## 2019-09-18 DIAGNOSIS — I350 Nonrheumatic aortic (valve) stenosis: Secondary | ICD-10-CM | POA: Diagnosis not present

## 2019-10-13 NOTE — Progress Notes (Signed)
Attempted to obtain medical history via telephone, unable to reach at this time. I left a voicemail to return pre surgical testing department's phone call.  

## 2019-10-19 DIAGNOSIS — N5231 Erectile dysfunction following radical prostatectomy: Secondary | ICD-10-CM | POA: Diagnosis not present

## 2019-10-19 DIAGNOSIS — C61 Malignant neoplasm of prostate: Secondary | ICD-10-CM | POA: Diagnosis not present

## 2019-10-20 ENCOUNTER — Ambulatory Visit (AMBULATORY_SURGERY_CENTER): Payer: Self-pay

## 2019-10-20 ENCOUNTER — Other Ambulatory Visit: Payer: Self-pay

## 2019-10-20 VITALS — Ht 71.0 in | Wt 215.2 lb

## 2019-10-20 DIAGNOSIS — Z86010 Personal history of colonic polyps: Secondary | ICD-10-CM

## 2019-10-20 DIAGNOSIS — Z1211 Encounter for screening for malignant neoplasm of colon: Secondary | ICD-10-CM

## 2019-10-20 MED ORDER — PLENVU 140 G PO SOLR: 1.0000 | ORAL | 0 refills | Status: DC

## 2019-10-20 NOTE — Progress Notes (Signed)
No allergies to soy or egg Pt is not on blood thinners or diet pills Denies issues with sedation/intubation Denies atrial flutter/fib Denies constipation   Pt is aware of Covid safety and care partner requirements.      

## 2019-10-21 ENCOUNTER — Encounter: Payer: Self-pay | Admitting: Gastroenterology

## 2019-10-22 ENCOUNTER — Other Ambulatory Visit (HOSPITAL_COMMUNITY)
Admission: RE | Admit: 2019-10-22 | Discharge: 2019-10-22 | Disposition: A | Discharge status: Home / Self Care | Payer: Medicare HMO | Source: Ambulatory Visit | Attending: Gastroenterology | Admitting: Gastroenterology

## 2019-10-22 DIAGNOSIS — Z01812 Encounter for preprocedural laboratory examination: Secondary | ICD-10-CM | POA: Insufficient documentation

## 2019-10-22 DIAGNOSIS — Z20822 Contact with and (suspected) exposure to covid-19: Secondary | ICD-10-CM | POA: Diagnosis not present

## 2019-10-22 LAB — SARS CORONAVIRUS 2 (TAT 6-24 HRS): SARS Coronavirus 2: NEGATIVE

## 2019-10-26 ENCOUNTER — Other Ambulatory Visit: Payer: Self-pay

## 2019-10-26 ENCOUNTER — Ambulatory Visit (HOSPITAL_COMMUNITY): Payer: Medicare HMO | Admitting: Registered Nurse

## 2019-10-26 ENCOUNTER — Encounter (HOSPITAL_COMMUNITY): Payer: Self-pay | Admitting: Gastroenterology

## 2019-10-26 ENCOUNTER — Ambulatory Visit (HOSPITAL_COMMUNITY)
Admission: RE | Admit: 2019-10-26 | Discharge: 2019-10-26 | Disposition: A | Discharge status: Home / Self Care | Payer: Medicare HMO | Attending: Gastroenterology | Admitting: Gastroenterology

## 2019-10-26 ENCOUNTER — Encounter (HOSPITAL_COMMUNITY)
Admission: RE | Disposition: A | Discharge status: Home / Self Care | Payer: Self-pay | Source: Home / Self Care | Attending: Gastroenterology

## 2019-10-26 DIAGNOSIS — K219 Gastro-esophageal reflux disease without esophagitis: Secondary | ICD-10-CM | POA: Insufficient documentation

## 2019-10-26 DIAGNOSIS — Y842 Radiological procedure and radiotherapy as the cause of abnormal reaction of the patient, or of later complication, without mention of misadventure at the time of the procedure: Secondary | ICD-10-CM | POA: Diagnosis not present

## 2019-10-26 DIAGNOSIS — D120 Benign neoplasm of cecum: Secondary | ICD-10-CM | POA: Diagnosis not present

## 2019-10-26 DIAGNOSIS — X58XXXA Exposure to other specified factors, initial encounter: Secondary | ICD-10-CM | POA: Insufficient documentation

## 2019-10-26 DIAGNOSIS — K6289 Other specified diseases of anus and rectum: Secondary | ICD-10-CM | POA: Insufficient documentation

## 2019-10-26 DIAGNOSIS — D123 Benign neoplasm of transverse colon: Secondary | ICD-10-CM | POA: Insufficient documentation

## 2019-10-26 DIAGNOSIS — Z8546 Personal history of malignant neoplasm of prostate: Secondary | ICD-10-CM | POA: Insufficient documentation

## 2019-10-26 DIAGNOSIS — I10 Essential (primary) hypertension: Secondary | ICD-10-CM | POA: Insufficient documentation

## 2019-10-26 DIAGNOSIS — K635 Polyp of colon: Secondary | ICD-10-CM

## 2019-10-26 DIAGNOSIS — Z85828 Personal history of other malignant neoplasm of skin: Secondary | ICD-10-CM | POA: Diagnosis not present

## 2019-10-26 DIAGNOSIS — K5730 Diverticulosis of large intestine without perforation or abscess without bleeding: Secondary | ICD-10-CM | POA: Insufficient documentation

## 2019-10-26 DIAGNOSIS — Z09 Encounter for follow-up examination after completed treatment for conditions other than malignant neoplasm: Secondary | ICD-10-CM | POA: Diagnosis present

## 2019-10-26 DIAGNOSIS — K5520 Angiodysplasia of colon without hemorrhage: Secondary | ICD-10-CM | POA: Insufficient documentation

## 2019-10-26 DIAGNOSIS — T184XXA Foreign body in colon, initial encounter: Secondary | ICD-10-CM | POA: Diagnosis not present

## 2019-10-26 DIAGNOSIS — Z951 Presence of aortocoronary bypass graft: Secondary | ICD-10-CM | POA: Insufficient documentation

## 2019-10-26 DIAGNOSIS — D124 Benign neoplasm of descending colon: Secondary | ICD-10-CM | POA: Insufficient documentation

## 2019-10-26 DIAGNOSIS — D122 Benign neoplasm of ascending colon: Secondary | ICD-10-CM | POA: Diagnosis not present

## 2019-10-26 DIAGNOSIS — I2510 Atherosclerotic heart disease of native coronary artery without angina pectoris: Secondary | ICD-10-CM | POA: Diagnosis not present

## 2019-10-26 DIAGNOSIS — Z86010 Personal history of colonic polyps: Secondary | ICD-10-CM | POA: Insufficient documentation

## 2019-10-26 HISTORY — PX: COLONOSCOPY WITH PROPOFOL: SHX5780

## 2019-10-26 HISTORY — PX: POLYPECTOMY: SHX5525

## 2019-10-26 HISTORY — PX: HOT HEMOSTASIS: SHX5433

## 2019-10-26 SURGERY — COLONOSCOPY WITH PROPOFOL
Anesthesia: Monitor Anesthesia Care

## 2019-10-26 MED ORDER — LACTATED RINGERS IV SOLN: INTRAVENOUS | Status: DC

## 2019-10-26 MED ORDER — PROPOFOL 500 MG/50ML IV EMUL
INTRAVENOUS | Status: DC | PRN
  Administered 2019-10-26: 125 ug/kg/min via INTRAVENOUS

## 2019-10-26 MED ORDER — PHENYLEPHRINE 40 MCG/ML (10ML) SYRINGE FOR IV PUSH (FOR BLOOD PRESSURE SUPPORT)
PREFILLED_SYRINGE | INTRAVENOUS | Status: DC | PRN
  Administered 2019-10-26 (×4): 120 ug via INTRAVENOUS

## 2019-10-26 MED ORDER — PROPOFOL 10 MG/ML IV BOLUS
INTRAVENOUS | Status: DC | PRN
  Administered 2019-10-26: 10 mg via INTRAVENOUS
  Administered 2019-10-26: 20 mg via INTRAVENOUS
  Administered 2019-10-26: 10 mg via INTRAVENOUS

## 2019-10-26 MED ORDER — PROPOFOL 500 MG/50ML IV EMUL
INTRAVENOUS | Status: AC
  Filled 2019-10-26: qty 50

## 2019-10-26 SURGICAL SUPPLY — 21 items

## 2019-10-26 NOTE — H&P (Signed)
History:  This patient presents for endoscopic testing for history of colon polyps.  Cecal polyp requiring EMR Aug 2020, multiple other polyps at that time.  Known radiation proctopathy - no prior endoscopic therapy b/c had not been bleeding.  Patient now reports intermittent painless rectal bleeding in last few months.  [name] Referring physician: Libby Maw, MD  Past Medical History: Past Medical History:  Diagnosis Date  . Abnormal liver enzymes   . Allergic rhinitis   . Allergy    seasonal and environmental  . CAD (coronary artery disease)   . Cataract   . Colon polyps   . Diverticulosis   . ED (erectile dysfunction)   . GERD (gastroesophageal reflux disease)   . Heart murmur    20 yrs ago  . Hemochromatosis   . HTN (hypertension)   . Hyperlipemia   . Meniere's disease   . Prostate cancer ([place])   . Retinal detachment      Past Surgical History: Past Surgical History:  Procedure Laterality Date  . APPENDECTOMY    . BASAL CELL CARCINOMA EXCISION  2014   behind right knee and back  . cataracts     . COLONOSCOPY  2020  . COLONOSCOPY WITH PROPOFOL N/A 09/23/2018   Procedure: COLONOSCOPY WITH PROPOFOL;  Surgeon: Doran Stabler, MD;  Location: [facility];  Service: Gastroenterology;  Laterality: N/A;  . HEMOSTASIS CLIP PLACEMENT  09/23/2018   Procedure: HEMOSTASIS CLIP PLACEMENT;  Surgeon: Doran Stabler, MD;  Location: [facility];  Service: Gastroenterology;;  . HOT HEMOSTASIS N/A 09/23/2018   Procedure: HOT HEMOSTASIS (ARGON PLASMA COAGULATION/BICAP);  Surgeon: Doran Stabler, MD;  Location: [facility];  Service: Gastroenterology;  Laterality: N/A;  . POLYPECTOMY    . POLYPECTOMY  09/23/2018   Procedure: POLYPECTOMY;  Surgeon: Doran Stabler, MD;  Location: [facility]OS[facility];  Service: Gastroenterology;;  . PROSTATECTOMY    . RETINAL DETACHMENT SURGERY    . SUBMUCOSAL LIFTING INJECTION  09/23/2018   Procedure: SUBMUCOSAL LIFTING  INJECTION;  Surgeon: Doran Stabler, MD;  Location: [facility];  Service: Gastroenterology;;  . tripple bypass N/A 12/07/2015    Allergies: Allergies  Allergen Reactions  . Penicillin G Hives    Did it involve swelling of the face/tongue/throat, SOB, or low BP? Unknown Did it involve sudden or severe rash/hives, skin peeling, or any reaction on the inside of your mouth or nose? Unknown Did you need to seek medical attention at a hospital or doctor's office? No When did it last happen?Occurred when he was a teenager If all above answers are "NO", may proceed with cephalosporin use.    . Atorvastatin Other (See Comments)    Causes muscle aches/cramps and tremors in fingers  . Iodinated Diagnostic Agents Hives    Years ago, 1 small hive on neck  . Omeprazole Rash    In high doses   . Shellfish Allergy Nausea Only, Rash and Other (See Comments)    Depends on the type of shellfish    Outpatient Meds: Current Facility-Administered Medications  Medication Dose Route Frequency Provider Last Rate Last Admin  . lactated ringers infusion   Intravenous Continuous [name], MD 20 mL/hr at 10/26/19 0828 New Bag at 10/26/19 0828      ___________________________________________________________________ Objective   Exam:  BP 137/78   Pulse 81   Temp 97.7 F (36.5 C) (Oral)   Resp 15   SpO2 97%    CV: RRR without  murmur, S1/S2, no JVD, no peripheral edema.  Sternotomy scar  Resp: clear to auscultation bilaterally, normal RR and effort noted  GI: soft, no tenderness, with active bowel sounds. No guarding or palpable organomegaly noted.  Neuro: awake, alert and oriented x 3. Normal gross motor function and fluent speech   Assessment:  Hx colon polyps and radiation proctopathy  Plan:  Colonoscopy with possible polypectomy and probable APC treatment in rectum. Discussed with patient in presence of endoscopy nurse and consent obtained.   Nelida Meuse  III

## 2019-10-26 NOTE — Discharge Instructions (Signed)

## 2019-10-26 NOTE — Transfer of Care (Signed)
Immediate Anesthesia Transfer of Care Note  Patient: Jeffrey Stein  Procedure(s) Performed: COLONOSCOPY WITH PROPOFOL (N/A ) HOT HEMOSTASIS (ARGON PLASMA COAGULATION/BICAP) (N/A ) POLYPECTOMY  Patient Location: PACU  Anesthesia Type:MAC  Level of Consciousness: awake, alert  and oriented  Airway & Oxygen Therapy: Patient Spontanous Breathing and Patient connected to face mask oxygen  Post-op Assessment: Report given to RN, Post -op Vital signs reviewed and stable and Patient moving all extremities X 4  Post vital signs: Reviewed and stable  Last Vitals:  Vitals Value Taken Time  BP    Temp    Pulse    Resp    SpO2      Last Pain:  Vitals:   10/26/19 0818  TempSrc: Oral  PainSc: 0-No pain         Complications: No complications documented.

## 2019-10-26 NOTE — Anesthesia Preprocedure Evaluation (Signed)
Anesthesia Evaluation  Patient identified by MRN, date of birth, ID band Patient awake    Reviewed: Allergy & Precautions, NPO status , Patient's Chart, lab work & pertinent test results  Airway Mallampati: II  TM Distance: >3 FB Neck ROM: Full    Dental no notable dental hx.    Pulmonary neg pulmonary ROS,    Pulmonary exam normal breath sounds clear to auscultation       Cardiovascular hypertension, Pt. on medications + CAD and + CABG  Normal cardiovascular exam Rhythm:Regular Rate:Normal     Neuro/Psych negative neurological ROS  negative psych ROS   GI/Hepatic Neg liver ROS, GERD  Medicated,  Endo/Other  negative endocrine ROS  Renal/GU negative Renal ROS  negative genitourinary   Musculoskeletal negative musculoskeletal ROS (+)   Abdominal   Peds negative pediatric ROS (+)  Hematology negative hematology ROS (+)   Anesthesia Other Findings   Reproductive/Obstetrics negative OB ROS                             Anesthesia Physical Anesthesia Plan  ASA: III  Anesthesia Plan: MAC   Post-op Pain Management:    Induction: Intravenous  PONV Risk Score and Plan:   Airway Management Planned: Simple Face Mask  Additional Equipment:   Intra-op Plan:   Post-operative Plan:   Informed Consent: I have reviewed the patients History and Physical, chart, labs and discussed the procedure including the risks, benefits and alternatives for the proposed anesthesia with the patient or authorized representative who has indicated his/her understanding and acceptance.     Dental advisory given  Plan Discussed with: CRNA and Surgeon  Anesthesia Plan Comments:         Anesthesia Quick Evaluation

## 2019-10-26 NOTE — Anesthesia Postprocedure Evaluation (Signed)
Anesthesia Post Note  Patient: Jeffrey Stein  Procedure(s) Performed: COLONOSCOPY WITH PROPOFOL (N/A ) HOT HEMOSTASIS (ARGON PLASMA COAGULATION/BICAP) (N/A ) POLYPECTOMY     Patient location during evaluation: Endoscopy Anesthesia Type: MAC Level of consciousness: awake and alert Pain management: pain level controlled Vital Signs Assessment: post-procedure vital signs reviewed and stable Respiratory status: spontaneous breathing, nonlabored ventilation and respiratory function stable Cardiovascular status: blood pressure returned to baseline and stable Postop Assessment: no apparent nausea or vomiting Anesthetic complications: no   No complications documented.  Last Vitals:  Vitals:   10/26/19 1115 10/26/19 1120  BP:  (!) 154/92  Pulse: 72 64  Resp: 20 20  Temp:    SpO2: 100% 98%    Last Pain:  Vitals:   10/26/19 1120  TempSrc:   PainSc: 0-No pain                 Merlinda Frederick

## 2019-10-26 NOTE — Interval H&P Note (Signed)
History and Physical Interval Note:  10/26/2019 9:50 AM  Jeffrey Stein  has presented today for surgery, with the diagnosis of History of polyps.  The various methods of treatment have been discussed with the patient and family. After consideration of risks, benefits and other options for treatment, the patient has consented to  Procedure(s): COLONOSCOPY WITH PROPOFOL (N/A) as a surgical intervention.  The patient's history has been reviewed, patient examined, no change in status, stable for surgery.  I have reviewed the patient's chart and labs.  Questions were answered to the patient's satisfaction.     [name]

## 2019-10-26 NOTE — Op Note (Signed)
[facility] Patient Name: Jeffrey Stein Procedure Date: 10/26/2019 MRN: [id number] Attending MD: Estill Cotta. Danis , MD Date of Birth: 10/22/1945 CSN: [id number] Age: 74 Admit Type: Outpatient Procedure:                Colonoscopy Indications:              Surveillance: History of piecemeal removal adenoma                            on last colonoscopy (< 3 yrs) ( EMR cecal polyp,                            additional large and small polyps Aug 2020)                           - Incidental rectal bleeding noted in last several                            months (patient has known radiation proctopathy) Providers:                Estill Cotta. Loletha Carrow, MD, Cleda Daub, RN, Mikey [employer], Faustina Mbumina, Technician Referring MD:              Medicines:                Monitored Anesthesia Care Complications:            No immediate complications. Estimated Blood Loss:     Estimated blood loss was minimal. Procedure:                Pre-Anesthesia Assessment:                           - Prior to the procedure, a History and Physical                            was performed, and patient medications and                            allergies were reviewed. The patient's tolerance of                            previous anesthesia was also reviewed. The risks                            and benefits of the procedure and the sedation                            options and risks were discussed with the patient.                            All questions were answered, and informed consent  was obtained. Prior Anticoagulants: The patient has                            taken no previous anticoagulant or antiplatelet                            agents except for aspirin. ASA Grade Assessment:                            III - A patient with severe systemic disease. After                            reviewing the risks and benefits, the patient  was                            deemed in satisfactory condition to undergo the                            procedure.                           After obtaining informed consent, the colonoscope                            was passed under direct vision. Throughout the                            procedure, the patient's blood pressure, pulse, and                            oxygen saturations were monitored continuously. The                            CF-HQ190L ([id number]) Olympus colonoscope was                            introduced through the anus and advanced to the the                            terminal ileum, with identification of the                            appendiceal orifice and IC valve. The colonoscopy                            was performed without difficulty. The patient                            tolerated the procedure well. The quality of the                            bowel preparation was good. The terminal ileum,  ileocecal valve, appendiceal orifice, and rectum                            were photographed. Scope In: 10:01:18 AM Scope Out: 10:49:40 AM Scope Withdrawal Time: 0 hours 44 minutes 2 seconds  Total Procedure Duration: 0 hours 48 minutes 22 seconds  Findings:      The perianal and digital rectal examinations were normal.      The terminal ileum appeared normal.      A 6 mm polyp was found in the cecum. The polyp was sessile and adjacent       to a scar from prior polypectomy. The polyp was removed with a hot       snare. Resection and retrieval were complete. Spot coagulation for       tissue ablation using argon plasma at 0.5 liters/minute and 20 watts was       successful.      Many diverticula were found in the left colon.      Four sessile polyps were found in the descending colon, transverse colon       and ascending colon. The polyps were diminutive in size. These polyps       were removed with a cold snare. Resection and  retrieval were complete.      Incidentally. a foreign body was found in the descending colon,       partially in a diverticulum (clip from prior polypectomy). It was easily       removed with a snare.      Extensive angioectasias without bleeding were found in the distal rectum       (anterior). Radiation proctopathy. Coagulation for hemostasis using       argon plasma at 0.5 liters/minute and 40 watts was successful. A small       area of this process on the anal verge could nto be treated due to its       location.      Retroflexion in the rectum was not performed.      The exam was otherwise without abnormality. Impression:               - The examined portion of the ileum was normal.                           - One 6 mm polyp in the cecum, removed with a hot                            snare. Resected and retrieved. Treated with argon                            plasma coagulation (APC).                           - Diverticulosis in the left colon.                           - Four diminutive polyps in the descending colon,                            in the transverse colon and in the ascending colon,  removed with a cold snare. Resected and retrieved.                           - Foreign body in the descending colon.                           - Extensive non-bleeding colonic angioectasias.                            Treated with argon plasma coagulation (APC).                           - The examination was otherwise normal. Moderate Sedation:      MAC sedation used Recommendation:           - Patient has a contact number available for                            emergencies. The signs and symptoms of potential                            delayed complications were discussed with the                            patient. Return to normal activities tomorrow.                            Written discharge instructions were provided to the                             patient.                           - Resume previous diet.                           - Continue present medications.                           - Await pathology results.                           - Repeat colonoscopy is recommended for                            surveillance. The colonoscopy date will be                            determined after pathology results from today's                            exam become available for review. Procedure Code(s):        --- Professional ---                           [phone], 70, Colonoscopy, flexible; with control of  bleeding, any method                           45385, Colonoscopy, flexible; with removal of                            tumor(s), polyp(s), or other lesion(s) by snare                            technique Diagnosis Code(s):        --- Professional ---                           K63.5, Polyp of colon                           Z86.010, Personal history of colonic polyps                           T18.4XXA, Foreign body in colon, initial encounter                           K55.20, Angiodysplasia of colon without hemorrhage CPT copyright 2019 American Medical Association. All rights reserved. The codes documented in this report are preliminary and upon coder review may  be revised to meet current compliance requirements. Takumi Din L. Loletha Carrow, MD 10/26/2019 11:06:32 AM This report has been signed electronically. Number of Addenda: 0

## 2019-10-26 NOTE — Anesthesia Procedure Notes (Signed)
Procedure Name: MAC Date/Time: 10/26/2019 9:53 AM Performed by: Niel Hummer, CRNA Pre-anesthesia Checklist: Patient identified, Emergency Drugs available, Suction available and Patient being monitored Oxygen Delivery Method: Simple face mask

## 2019-10-27 ENCOUNTER — Encounter (HOSPITAL_COMMUNITY): Payer: Self-pay | Admitting: Gastroenterology

## 2019-10-27 LAB — SURGICAL PATHOLOGY

## 2019-10-28 ENCOUNTER — Encounter: Payer: Self-pay | Admitting: Gastroenterology

## 2019-10-29 DIAGNOSIS — L304 Erythema intertrigo: Secondary | ICD-10-CM | POA: Diagnosis not present

## 2019-10-29 DIAGNOSIS — L814 Other melanin hyperpigmentation: Secondary | ICD-10-CM | POA: Diagnosis not present

## 2019-10-29 DIAGNOSIS — L821 Other seborrheic keratosis: Secondary | ICD-10-CM | POA: Diagnosis not present

## 2019-10-29 DIAGNOSIS — Z85828 Personal history of other malignant neoplasm of skin: Secondary | ICD-10-CM | POA: Diagnosis not present

## 2019-11-03 DIAGNOSIS — R69 Illness, unspecified: Secondary | ICD-10-CM | POA: Diagnosis not present

## 2019-11-04 DIAGNOSIS — N5231 Erectile dysfunction following radical prostatectomy: Secondary | ICD-10-CM | POA: Diagnosis not present

## 2019-11-11 ENCOUNTER — Ambulatory Visit: Payer: Medicare HMO | Admitting: *Deleted

## 2019-11-11 ENCOUNTER — Telehealth: Payer: Self-pay | Admitting: Family Medicine

## 2019-11-11 NOTE — Telephone Encounter (Signed)
Patient came in for Medicare wellness appt with Jeffrey Stein that had not been rescheduled. He wants to know if Dr. Ethelene Hal requires a yearly physical and if he needs to schedule that.

## 2019-11-11 NOTE — Telephone Encounter (Signed)
Spoke with patient who's states that he is very irritated with the office in handling his appointments. Per patient he had an appointment for his wellness visit in which he figured was his yearly physical. I explained to patient that wellness appointment and CPE are different appointments and from looking at his chart it did look like he was due for his yearly, offered to schedule appointment but patient would like a call from Dr. Ethelene Hal clarify if he wants patient to come in for CPE. Please advise or give patient a call.

## 2019-11-12 NOTE — Telephone Encounter (Signed)
Patient is overdue to see me for a physical.

## 2019-11-12 NOTE — Telephone Encounter (Signed)
Appointment scheduled for CPE

## 2019-11-23 ENCOUNTER — Other Ambulatory Visit: Payer: Self-pay

## 2019-11-24 ENCOUNTER — Encounter: Payer: Self-pay | Admitting: Family Medicine

## 2019-11-24 ENCOUNTER — Ambulatory Visit (INDEPENDENT_AMBULATORY_CARE_PROVIDER_SITE_OTHER): Payer: Medicare HMO | Admitting: Family Medicine

## 2019-11-24 VITALS — BP 98/62 | HR 82 | Temp 97.4°F | Ht 71.0 in | Wt 219.4 lb

## 2019-11-24 DIAGNOSIS — I952 Hypotension due to drugs: Secondary | ICD-10-CM

## 2019-11-24 DIAGNOSIS — E876 Hypokalemia: Secondary | ICD-10-CM

## 2019-11-24 DIAGNOSIS — E7800 Pure hypercholesterolemia, unspecified: Secondary | ICD-10-CM | POA: Diagnosis not present

## 2019-11-24 LAB — BASIC METABOLIC PANEL
BUN: 21 mg/dL (ref 6–23)
CO2: 27 mEq/L (ref 19–32)
Calcium: 9 mg/dL (ref 8.4–10.5)
Chloride: 101 mEq/L (ref 96–112)
Creatinine, Ser: 1.43 mg/dL (ref 0.40–1.50)
GFR: 47.96 mL/min — ABNORMAL LOW (ref >60.00)
Glucose, Bld: 98 mg/dL (ref 70–99)
Potassium: 3.2 mEq/L — ABNORMAL LOW (ref 3.5–5.1)
Sodium: 137 mEq/L (ref 135–145)

## 2019-11-24 LAB — LDL CHOLESTEROL, DIRECT: Direct LDL: 73 mg/dL

## 2019-11-24 NOTE — Progress Notes (Addendum)
Established Patient Office Visit  Subjective:  Patient ID: Jeffrey Stein, male    DOB: Mar 24, 1945  Age: 74 y.o. MRN: [id number]  CC:  Chief Complaint  Patient presents with  . Annual Exam    CPE, no concerns. Patient not fasting.     HPI Jeffrey Stein presents for for checkup.  He is also followed by [facility] cardiology for his blood pressure and elevated cholesterol.  He has a history of ASCVD.  Ongoing follow-up with [facility] urology status post prostatectomy.  Admits to lightheadedness when he stands often.  Hypertension is currently treated with carvedilol, losartan, chlorthalidone.  He is taking potassium supplementation as well.  Cholesterol is treated with rosuvastatin 20 mg.  He is nonfasting today.  Blood pressure at home tends to run systolically 782-956/21-30.  Enjoys 2 alcoholic drinks daily.  He does not smoke or use illicit drugs.  There was a mixup with his annual Medicare wellness visit and he wanted me to know about it.  Past Medical History:  Diagnosis Date  . Abnormal liver enzymes   . Allergic rhinitis   . Allergy    seasonal and environmental  . CAD (coronary artery disease)   . Cataract   . Colon polyps   . Diverticulosis   . ED (erectile dysfunction)   . GERD (gastroesophageal reflux disease)   . Heart murmur    20 yrs ago  . Hemochromatosis   . HTN (hypertension)   . Hyperlipemia   . Meniere's disease   . Prostate cancer ([place])   . Retinal detachment     Past Surgical History:  Procedure Laterality Date  . APPENDECTOMY    . BASAL CELL CARCINOMA EXCISION  2014   behind right knee and back  . cataracts     . COLONOSCOPY  2020  . COLONOSCOPY WITH PROPOFOL N/A 09/23/2018   Procedure: COLONOSCOPY WITH PROPOFOL;  Surgeon: Doran Stabler, MD;  Location: [facility];  Service: Gastroenterology;  Laterality: N/A;  . COLONOSCOPY WITH PROPOFOL N/A 10/26/2019   Procedure: COLONOSCOPY WITH PROPOFOL;  Surgeon: Doran Stabler, MD;  Location: [facility];   Service: Gastroenterology;  Laterality: N/A;  . HEMOSTASIS CLIP PLACEMENT  09/23/2018   Procedure: HEMOSTASIS CLIP PLACEMENT;  Surgeon: Doran Stabler, MD;  Location: [facility];  Service: Gastroenterology;;  . HOT HEMOSTASIS N/A 09/23/2018   Procedure: HOT HEMOSTASIS (ARGON PLASMA COAGULATION/BICAP);  Surgeon: Doran Stabler, MD;  Location: [facility];  Service: Gastroenterology;  Laterality: N/A;  . HOT HEMOSTASIS N/A 10/26/2019   Procedure: HOT HEMOSTASIS (ARGON PLASMA COAGULATION/BICAP);  Surgeon: Doran Stabler, MD;  Location: [facility];  Service: Gastroenterology;  Laterality: N/A;  . POLYPECTOMY    . POLYPECTOMY  09/23/2018   Procedure: POLYPECTOMY;  Surgeon: Doran Stabler, MD;  Location: [facility];  Service: Gastroenterology;;  . POLYPECTOMY  10/26/2019   Procedure: POLYPECTOMY;  Surgeon: Doran Stabler, MD;  Location: [facility];  Service: Gastroenterology;;  . PROSTATECTOMY    . RETINAL DETACHMENT SURGERY    . SUBMUCOSAL LIFTING INJECTION  09/23/2018   Procedure: SUBMUCOSAL LIFTING INJECTION;  Surgeon: Doran Stabler, MD;  Location: [facility];  Service: Gastroenterology;;  . tripple bypass N/A 12/07/2015    Family History  Problem Relation Age of Onset  . Colon cancer Mother   . Emphysema Father   . Colon cancer Brother   . Stomach cancer Neg Hx   . Pancreatic cancer Neg Hx   .  Esophageal cancer Neg Hx   . Rectal cancer Neg Hx   . Colon polyps Neg Hx     Social History   Socioeconomic History  . Marital status: Married    Spouse name: Mary  . Number of children: Not on file  . Years of education: Not on file  . Highest education level: Not on file  Occupational History  . Not on file  Tobacco Use  . Smoking status: Never Smoker  . Smokeless tobacco: Never Used  Vaping Use  . Vaping Use: Never used  Substance and Sexual Activity  . Alcohol use: Yes    Comment: 2 cocktails per day  . Drug use: No  . Sexual activity: Not on  file  Other Topics Concern  . Not on file  Social History Narrative  . Not on file   Social Determinants of Health   Financial Resource Strain:   . Difficulty of Paying Living Expenses: Not on file  Food Insecurity:   . Worried About Charity fundraiser in the Last Year: Not on file  . Ran Out of Food in the Last Year: Not on file  Transportation Needs:   . Lack of Transportation (Medical): Not on file  . Lack of Transportation (Non-Medical): Not on file  Physical Activity:   . Days of Exercise per Week: Not on file  . Minutes of Exercise per Session: Not on file  Stress:   . Feeling of Stress : Not on file  Social Connections:   . Frequency of Communication with Friends and Family: Not on file  . Frequency of Social Gatherings with Friends and Family: Not on file  . Attends Religious Services: Not on file  . Active Member of Clubs or Organizations: Not on file  . Attends Archivist Meetings: Not on file  . Marital Status: Not on file  Intimate Partner Violence:   . Fear of Current or Ex-Partner: Not on file  . Emotionally Abused: Not on file  . Physically Abused: Not on file  . Sexually Abused: Not on file    Outpatient Medications Prior to Visit  Medication Sig Dispense Refill  . aspirin EC 81 MG tablet Take 81 mg by mouth at bedtime.     . carvedilol (COREG) 6.25 MG tablet Take 1 tablet (6.25 mg total) by mouth 2 (two) times daily. 180 tablet 3  . fluticasone (FLONASE) 50 MCG/ACT nasal spray Place 2 sprays into both nostrils daily.    Marland Kitchen losartan (COZAAR) 100 MG tablet Take 1 tablet (100 mg total) by mouth daily. (Patient taking differently: Take 100 mg by mouth every evening. ) 30 tablet 3  . Polyethyl Glycol-Propyl Glycol (LUBRICANT EYE DROPS) 0.4-0.3 % SOLN Place 1 drop into both eyes 3 (three) times daily as needed (dry/irritated eyes.).    Marland Kitchen rosuvastatin (CRESTOR) 20 MG tablet Take 20 mg by mouth every evening.     . trospium (SANCTURA) 20 MG tablet Take by  mouth.    . vitamin E 400 UNIT capsule Take 800 Units by mouth daily.     . chlorthalidone (HYGROTON) 25 MG tablet Take 12.5 mg by mouth daily.     . potassium chloride (K-DUR,KLOR-CON) 10 MEQ tablet Take 10 mEq by mouth daily.     . Papav-Phentolamine-Alprostadil (TRI-MIX) 150-5-50 MG-MG-MCG SOLR 1 Dose by Intracavernosal route daily as needed (erectile dysfunction.).     Facility-Administered Medications Prior to Visit  Medication Dose Route Frequency Provider Last Rate Last Admin  .  0.9 %  sodium chloride infusion  500 mL Intravenous Once Doran Stabler, MD        Allergies  Allergen Reactions  . Penicillin G Hives    Did it involve swelling of the face/tongue/throat, SOB, or low BP? Unknown Did it involve sudden or severe rash/hives, skin peeling, or any reaction on the inside of your mouth or nose? Unknown Did you need to seek medical attention at a hospital or doctor's office? No When did it last happen?Occurred when he was a teenager If all above answers are "NO", may proceed with cephalosporin use.    . Atorvastatin Other (See Comments)    Causes muscle aches/cramps and tremors in fingers  . Iodinated Diagnostic Agents Hives    Years ago, 1 small hive on neck  . Omeprazole Rash    In high doses   . Shellfish Allergy Nausea Only, Rash and Other (See Comments)    Depends on the type of shellfish    ROS Review of Systems  Constitutional: Negative.   Respiratory: Negative.   Cardiovascular: Negative.   Gastrointestinal: Negative.   Neurological: Positive for light-headedness. Negative for tremors and speech difficulty.  Hematological: Does not bruise/bleed easily.  Psychiatric/Behavioral: Negative.    Depression screen [facility] 2/9 11/24/2019 10/08/2018 10/30/2016  Decreased Interest 0 0 0  Down, Depressed, Hopeless 0 0 0  PHQ - 2 Score 0 0 0  Altered sleeping 0 - -  Tired, decreased energy 0 - -  Change in appetite 1 - -  Feeling bad or failure about yourself  0 - -   Trouble concentrating 0 - -  Moving slowly or fidgety/restless 0 - -  Suicidal thoughts 0 - -  PHQ-9 Score 1 - -  Difficult doing work/chores Not difficult at all - -      Objective:    Physical Exam Vitals and nursing note reviewed.  Constitutional:      General: He is not in acute distress.    Appearance: Normal appearance. He is not ill-appearing, toxic-appearing or diaphoretic.  HENT:     Head: Normocephalic and atraumatic.     Right Ear: Tympanic membrane, ear canal and external ear normal.     Left Ear: Tympanic membrane, ear canal and external ear normal.  Eyes:     General: No scleral icterus.       Right eye: No discharge.        Left eye: No discharge.     Extraocular Movements: Extraocular movements intact.     Conjunctiva/sclera: Conjunctivae normal.     Pupils: Pupils are equal, round, and reactive to light.  Cardiovascular:     Rate and Rhythm: Normal rate and regular rhythm.  Pulmonary:     Effort: Pulmonary effort is normal.     Breath sounds: Normal breath sounds.  Abdominal:     General: Bowel sounds are normal. There is no distension.     Palpations: Abdomen is soft. There is no mass.     Tenderness: There is no abdominal tenderness. There is no guarding or rebound.     Hernia: No hernia is present.  Musculoskeletal:     Cervical back: No rigidity or tenderness.     Right lower leg: No edema.     Left lower leg: No edema.  Lymphadenopathy:     Cervical: No cervical adenopathy.  Skin:    General: Skin is warm and dry.  Neurological:     Mental Status: He is alert and  oriented to person, place, and time.  Psychiatric:        Mood and Affect: Mood normal.        Behavior: Behavior normal.     BP 98/62   Pulse 82   Temp (!) 97.4 F (36.3 C) (Tympanic)   Ht 5\' 11"  (1.803 m)   Wt 219 lb 6.4 oz (99.5 kg)   SpO2 98%   BMI 30.60 kg/m  Wt Readings from Last 3 Encounters:  11/24/19 219 lb 6.4 oz (99.5 kg)  10/20/19 215 lb 3.2 oz (97.6 kg)    09/10/18 210 lb (95.3 kg)     Health Maintenance Due  Topic Date Due  . TETANUS/TDAP  Never done  . PNA vac Low Risk Adult (1 of 2 - PCV13) Never done    There are no preventive care reminders to display for this patient.  Lab Results  Component Value Date   TSH 1.640 01/03/2015   Lab Results  Component Value Date   WBC 4.1 04/29/2017   HGB 13.9 04/29/2017   HCT 40.4 04/29/2017   MCV 102.9 (H) 04/29/2017   PLT 166.0 04/29/2017   Lab Results  Component Value Date   NA 137 11/24/2019   K 3.2 (L) 11/24/2019   CO2 27 11/24/2019   GLUCOSE 98 11/24/2019   BUN 21 11/24/2019   CREATININE 1.43 11/24/2019   BILITOT 0.6 08/01/2015   ALKPHOS 44 08/01/2015   AST 69 (H) 08/01/2015   ALT 95 (H) 08/01/2015   PROT 7.9 08/01/2015   ALBUMIN 4.3 08/01/2015   CALCIUM 9.0 11/24/2019   GFR 47.96 (L) 11/24/2019   Lab Results  Component Value Date   CHOL 226 (H) 08/01/2015   Lab Results  Component Value Date   HDL 42.00 08/01/2015   Lab Results  Component Value Date   LDLCALC 160 (H) 08/01/2015   Lab Results  Component Value Date   TRIG 116.0 08/01/2015   Lab Results  Component Value Date   CHOLHDL 5 08/01/2015   Lab Results  Component Value Date   HGBA1C 5.8 08/01/2015      Assessment & Plan:   Problem List Items Addressed This Visit      Cardiovascular and Mediastinum   Hypotension due to drugs - Primary   Relevant Orders   Basic metabolic panel (Completed)     Other   Elevated LDL cholesterol level   Relevant Orders   LDL cholesterol, direct (Completed)    Other Visit Diagnoses    Hypokalemia       Relevant Orders   Potassium      No orders of the defined types were placed in this encounter.   Follow-up: Return in about 1 month (around 12/25/2019), or stop chlorthalidone and potassium..  Believe that the patient has ongoing hypotension from overtreated hypertension.  Have discontinued the chlorthalidone and potassium.  He will check his blood  pressures at home.  He will follow-up here in 1 week month. Libby Maw, MD

## 2019-11-25 NOTE — Addendum Note (Signed)
Addended by: Jon Billings on: 11/25/2019 08:12 AM   Modules accepted: Orders

## 2019-12-01 ENCOUNTER — Telehealth: Payer: Self-pay | Admitting: Family Medicine

## 2019-12-01 NOTE — Telephone Encounter (Signed)
Spoke with patient and explained the AWV and he thought it was a waste of time and declined

## 2019-12-09 ENCOUNTER — Other Ambulatory Visit (INDEPENDENT_AMBULATORY_CARE_PROVIDER_SITE_OTHER): Payer: Medicare HMO

## 2019-12-09 ENCOUNTER — Other Ambulatory Visit: Payer: Self-pay

## 2019-12-09 DIAGNOSIS — E876 Hypokalemia: Secondary | ICD-10-CM

## 2019-12-09 LAB — POTASSIUM: Potassium: 3.3 mEq/L — ABNORMAL LOW (ref 3.5–5.1)

## 2019-12-22 ENCOUNTER — Other Ambulatory Visit: Payer: Self-pay

## 2019-12-24 ENCOUNTER — Encounter: Payer: Self-pay | Admitting: Family Medicine

## 2019-12-24 ENCOUNTER — Other Ambulatory Visit: Payer: Self-pay

## 2019-12-24 ENCOUNTER — Other Ambulatory Visit: Payer: Self-pay | Admitting: Family Medicine

## 2019-12-24 ENCOUNTER — Telehealth: Payer: Self-pay

## 2019-12-24 ENCOUNTER — Ambulatory Visit (INDEPENDENT_AMBULATORY_CARE_PROVIDER_SITE_OTHER): Payer: Medicare HMO | Admitting: Family Medicine

## 2019-12-24 VITALS — BP 120/82 | HR 87 | Temp 97.5°F | Ht 71.0 in | Wt 217.8 lb

## 2019-12-24 DIAGNOSIS — I10 Essential (primary) hypertension: Secondary | ICD-10-CM

## 2019-12-24 DIAGNOSIS — E876 Hypokalemia: Secondary | ICD-10-CM

## 2019-12-24 DIAGNOSIS — I952 Hypotension due to drugs: Secondary | ICD-10-CM | POA: Diagnosis not present

## 2019-12-24 LAB — POTASSIUM: Potassium: 3.4 mEq/L — ABNORMAL LOW (ref 3.5–5.1)

## 2019-12-24 MED ORDER — LOSARTAN POTASSIUM 100 MG PO TABS
50.0000 mg | ORAL_TABLET | Freq: Every day | ORAL | 3 refills | Status: DC
Start: 2019-12-24 — End: 2020-04-05

## 2019-12-24 MED ORDER — POTASSIUM CHLORIDE CRYS ER 10 MEQ PO TBCR
10.0000 meq | EXTENDED_RELEASE_TABLET | Freq: Every day | ORAL | 3 refills | Status: DC
Start: 2019-12-24 — End: 2020-06-13

## 2019-12-24 NOTE — Addendum Note (Signed)
Addended by: Boris Lown B on: 12/24/2019 09:52 AM   Modules accepted: Orders

## 2019-12-24 NOTE — Telephone Encounter (Signed)
Pt returning call from [place].  Please advise, CB# [phone]

## 2019-12-24 NOTE — Progress Notes (Signed)
Chief Complaint  Patient presents with  . Follow-up    1 month f/u BP, wants to discuss recent labs for GFR results.      HPI: Jeffrey Stein is a 74 y.o. male patient of Dr. Ethelene Hal here for HTN follow-up. He was seen by PCP about 1 mo ago, and due to issues with hypotension, pts chlorthalidone was stopped. He returns today to f/u on BP. Cardio ([facility]) recommended also decreasing losartan from 100mg  to 50mg  daily. Since making these med adjustments, home BP readings have increased slightly. This AM 121/79. Cardio appt in 03/2020. He is currently taking losartan 50mg  daily and coreg 6.25mg  BID. He is still taking the K[place]-con 64meq daily.    BP Readings from Last 3 Encounters:  12/24/19 120/82  11/24/19 98/62  10/26/19 (!) 154/92   Lab Results  Component Value Date   CREATININE 1.43 11/24/2019   BUN 21 11/24/2019   NA 137 11/24/2019   K 3.3 (L) 12/09/2019   CL 101 11/24/2019   CO2 27 11/24/2019   Lab Results  Component Value Date   NA 137 11/24/2019   K 3.3 (L) 12/09/2019   CREATININE 1.43 11/24/2019   GLUCOSE 98 11/24/2019     Past Medical History:  Diagnosis Date  . Abnormal liver enzymes   . Allergic rhinitis   . Allergy    seasonal and environmental  . CAD (coronary artery disease)   . Cataract   . Colon polyps   . Diverticulosis   . ED (erectile dysfunction)   . GERD (gastroesophageal reflux disease)   . Heart murmur    20 yrs ago  . Hemochromatosis   . HTN (hypertension)   . Hyperlipemia   . Meniere's disease   . Prostate cancer ([place])   . Retinal detachment     Past Surgical History:  Procedure Laterality Date  . APPENDECTOMY    . BASAL CELL CARCINOMA EXCISION  2014   behind right knee and back  . cataracts     . COLONOSCOPY  2020  . COLONOSCOPY WITH PROPOFOL N/A 09/23/2018   Procedure: COLONOSCOPY WITH PROPOFOL;  Surgeon: Doran Stabler, MD;  Location: [facility];  Service: Gastroenterology;  Laterality: N/A;  . COLONOSCOPY WITH PROPOFOL  N/A 10/26/2019   Procedure: COLONOSCOPY WITH PROPOFOL;  Surgeon: Doran Stabler, MD;  Location: [facility];  Service: Gastroenterology;  Laterality: N/A;  . HEMOSTASIS CLIP PLACEMENT  09/23/2018   Procedure: HEMOSTASIS CLIP PLACEMENT;  Surgeon: Doran Stabler, MD;  Location: [facility];  Service: Gastroenterology;;  . HOT HEMOSTASIS N/A 09/23/2018   Procedure: HOT HEMOSTASIS (ARGON PLASMA COAGULATION/BICAP);  Surgeon: Doran Stabler, MD;  Location: [facility];  Service: Gastroenterology;  Laterality: N/A;  . HOT HEMOSTASIS N/A 10/26/2019   Procedure: HOT HEMOSTASIS (ARGON PLASMA COAGULATION/BICAP);  Surgeon: Doran Stabler, MD;  Location: [facility];  Service: Gastroenterology;  Laterality: N/A;  . POLYPECTOMY    . POLYPECTOMY  09/23/2018   Procedure: POLYPECTOMY;  Surgeon: Doran Stabler, MD;  Location: [facility];  Service: Gastroenterology;;  . POLYPECTOMY  10/26/2019   Procedure: POLYPECTOMY;  Surgeon: Doran Stabler, MD;  Location: [facility];  Service: Gastroenterology;;  . PROSTATECTOMY    . RETINAL DETACHMENT SURGERY    . SUBMUCOSAL LIFTING INJECTION  09/23/2018   Procedure: SUBMUCOSAL LIFTING INJECTION;  Surgeon: Doran Stabler, MD;  Location: [facility];  Service: Gastroenterology;;  . tripple bypass N/A 12/07/2015  Social History   Socioeconomic History  . Marital status: Married    Spouse name: Tryston Gilliam  . Number of children: Not on file  . Years of education: Not on file  . Highest education level: Not on file  Occupational History  . Not on file  Tobacco Use  . Smoking status: Never Smoker  . Smokeless tobacco: Never Used  Vaping Use  . Vaping Use: Never used  Substance and Sexual Activity  . Alcohol use: Yes    Comment: 2 cocktails per day  . Drug use: No  . Sexual activity: Not on file  Other Topics Concern  . Not on file  Social History Narrative  . Not on file   Social Determinants of Health   Financial Resource Strain:    . Difficulty of Paying Living Expenses: Not on file  Food Insecurity:   . Worried About Charity fundraiser in the Last Year: Not on file  . Ran Out of Food in the Last Year: Not on file  Transportation Needs:   . Lack of Transportation (Medical): Not on file  . Lack of Transportation (Non-Medical): Not on file  Physical Activity:   . Days of Exercise per Week: Not on file  . Minutes of Exercise per Session: Not on file  Stress:   . Feeling of Stress : Not on file  Social Connections:   . Frequency of Communication with Friends and Family: Not on file  . Frequency of Social Gatherings with Friends and Family: Not on file  . Attends Religious Services: Not on file  . Active Member of Clubs or Organizations: Not on file  . Attends Archivist Meetings: Not on file  . Marital Status: Not on file  Intimate Partner Violence:   . Fear of Current or Ex-Partner: Not on file  . Emotionally Abused: Not on file  . Physically Abused: Not on file  . Sexually Abused: Not on file    Family History  Problem Relation Age of Onset  . Colon cancer Mother   . Emphysema Father   . Colon cancer Brother   . Stomach cancer Neg Hx   . Pancreatic cancer Neg Hx   . Esophageal cancer Neg Hx   . Rectal cancer Neg Hx   . Colon polyps Neg Hx      Immunization History  Administered Date(s) Administered  . Fluad Quad(high Dose 65+) 10/15/2018  . Influenza, High Dose Seasonal PF 10/30/2016, 11/25/2017  . Influenza-Unspecified 11/19/2016, 11/06/2019  . PFIZER SARS-COV-2 Vaccination 03/01/2019, 03/23/2019, 11/06/2019  . Zoster Recombinat (Shingrix) 09/16/2017, 11/25/2017    Outpatient Encounter Medications as of 12/24/2019  Medication Sig Note  . aspirin EC 81 MG tablet Take 81 mg by mouth at bedtime.    . carvedilol (COREG) 6.25 MG tablet Take 1 tablet (6.25 mg total) by mouth 2 (two) times daily.   . fluticasone (FLONASE) 50 MCG/ACT nasal spray Place 2 sprays into both nostrils daily.     Marland Kitchen losartan (COZAAR) 100 MG tablet Take 1 tablet (100 mg total) by mouth daily. (Patient taking differently: Take 100 mg by mouth every evening. )   . Polyethyl Glycol-Propyl Glycol (LUBRICANT EYE DROPS) 0.4-0.3 % SOLN Place 1 drop into both eyes 3 (three) times daily as needed (dry/irritated eyes.).   Marland Kitchen potassium chloride (KLOR-CON) 10 MEQ tablet Take 10 mEq by mouth 2 (two) times daily.   . rosuvastatin (CRESTOR) 20 MG tablet Take 20 mg by mouth every evening.    Marland Kitchen  trospium (SANCTURA) 20 MG tablet Take by mouth. 10/26/2019: Therapy not yet started  . vitamin E 400 UNIT capsule Take 800 Units by mouth daily.    . [DISCONTINUED] Papav-Phentolamine-Alprostadil (TRI-MIX) 150-5-50 MG-MG-MCG SOLR 1 Dose by Intracavernosal route daily as needed (erectile dysfunction.).    Facility-Administered Encounter Medications as of 12/24/2019  Medication  . 0.9 %  sodium chloride infusion     ROS: Pertinent positives and negatives noted in HPI. Remainder of ROS non-contributory    Allergies  Allergen Reactions  . Penicillin G Hives    Did it involve swelling of the face/tongue/throat, SOB, or low BP? Unknown Did it involve sudden or severe rash/hives, skin peeling, or any reaction on the inside of your mouth or nose? Unknown Did you need to seek medical attention at a hospital or doctor's office? No When did it last happen?Occurred when he was a teenager If all above answers are "NO", may proceed with cephalosporin use.    . Atorvastatin Other (See Comments)    Causes muscle aches/cramps and tremors in fingers  . Iodinated Diagnostic Agents Hives    Years ago, 1 small hive on neck  . Omeprazole Rash    In high doses   . Shellfish Allergy Nausea Only, Rash and Other (See Comments)    Depends on the type of shellfish    BP 120/82   Pulse 87   Temp (!) 97.5 F (36.4 C) (Temporal)   Ht 5\' 11"  (1.803 m)   Wt 217 lb 12.5 oz (98.8 kg)   SpO2 94%   BMI 30.37 kg/m    BP Readings from Last 3  Encounters:  12/24/19 120/82  11/24/19 98/62  10/26/19 (!) 154/92   Pulse Readings from Last 3 Encounters:  12/24/19 87  11/24/19 82  10/26/19 64   Wt Readings from Last 3 Encounters:  12/24/19 217 lb 12.5 oz (98.8 kg)  11/24/19 219 lb 6.4 oz (99.5 kg)  10/20/19 215 lb 3.2 oz (97.6 kg)     Physical Exam Constitutional:      General: He is not in acute distress.    Appearance: Normal appearance. He is not ill-appearing.  Cardiovascular:     Rate and Rhythm: Normal rate and regular rhythm.  Pulmonary:     Effort: No respiratory distress.  Neurological:     General: No focal deficit present.     Mental Status: He is alert and oriented to person, place, and time.  Psychiatric:        Mood and Affect: Mood normal.        Behavior: Behavior normal.      A/P:  1. Primary hypertension - controlled on losartan 50mg  daily and coreg 6.25mg  BID - cont with low sodium diet, regular cardio f/u (scheduled in 03/2020) - cont to check BP at home and f/u PRN  2. Hypotension due to drugs - resolved - see above #1  3. Hypokalemia - on KCl 74meq daily - Potassium; Future   This visit occurred during the SARS-CoV-2 public health emergency.  Safety protocols were in place, including screening questions prior to the visit, additional usage of staff PPE, and extensive cleaning of exam room while observing appropriate contact time as indicated for disinfecting solutions.

## 2020-01-04 DIAGNOSIS — Z0100 Encounter for examination of eyes and vision without abnormal findings: Secondary | ICD-10-CM | POA: Diagnosis not present

## 2020-01-05 DIAGNOSIS — Z7982 Long term (current) use of aspirin: Secondary | ICD-10-CM | POA: Diagnosis not present

## 2020-01-05 DIAGNOSIS — I2583 Coronary atherosclerosis due to lipid rich plaque: Secondary | ICD-10-CM | POA: Diagnosis not present

## 2020-01-05 DIAGNOSIS — R351 Nocturia: Secondary | ICD-10-CM | POA: Diagnosis not present

## 2020-01-05 DIAGNOSIS — I10 Essential (primary) hypertension: Secondary | ICD-10-CM | POA: Diagnosis not present

## 2020-01-05 DIAGNOSIS — Z8546 Personal history of malignant neoplasm of prostate: Secondary | ICD-10-CM | POA: Diagnosis not present

## 2020-01-05 DIAGNOSIS — I2510 Atherosclerotic heart disease of native coronary artery without angina pectoris: Secondary | ICD-10-CM | POA: Diagnosis not present

## 2020-01-05 DIAGNOSIS — N5231 Erectile dysfunction following radical prostatectomy: Secondary | ICD-10-CM | POA: Diagnosis not present

## 2020-01-05 DIAGNOSIS — N3941 Urge incontinence: Secondary | ICD-10-CM | POA: Diagnosis not present

## 2020-01-05 DIAGNOSIS — Z923 Personal history of irradiation: Secondary | ICD-10-CM | POA: Diagnosis not present

## 2020-01-05 DIAGNOSIS — Z79899 Other long term (current) drug therapy: Secondary | ICD-10-CM | POA: Diagnosis not present

## 2020-01-13 ENCOUNTER — Telehealth: Payer: Self-pay | Admitting: Family Medicine

## 2020-01-13 NOTE — Progress Notes (Signed)
  Chronic Care Management   Outreach Note  01/13/2020 Name: Jeffrey Stein MRN: [id number] DOB: 06/28/1945  Referred by: Libby Maw, MD Reason for referral : No chief complaint on file.   An unsuccessful telephone outreach was attempted today. The patient was referred to the pharmacist for assistance with care management and care coordination.   Follow Up Plan:   Carley Perdue [employer] Scheduler

## 2020-01-25 ENCOUNTER — Telehealth: Payer: Self-pay | Admitting: Family Medicine

## 2020-01-25 NOTE — Telephone Encounter (Signed)
Attempted to schedule AWV. Unable to LVM.  Will try at later time.   Called patient to schedule Annual Wellness Visit.  Please schedule with Nurse Health Advisor Martha Stanley, RN at [facility]  

## 2020-03-17 DIAGNOSIS — H33022 Retinal detachment with multiple breaks, left eye: Secondary | ICD-10-CM | POA: Diagnosis not present

## 2020-03-17 DIAGNOSIS — H43811 Vitreous degeneration, right eye: Secondary | ICD-10-CM | POA: Diagnosis not present

## 2020-03-17 DIAGNOSIS — H353131 Nonexudative age-related macular degeneration, bilateral, early dry stage: Secondary | ICD-10-CM | POA: Diagnosis not present

## 2020-03-17 DIAGNOSIS — H04123 Dry eye syndrome of bilateral lacrimal glands: Secondary | ICD-10-CM | POA: Diagnosis not present

## 2020-03-17 DIAGNOSIS — H33311 Horseshoe tear of retina without detachment, right eye: Secondary | ICD-10-CM | POA: Diagnosis not present

## 2020-03-17 DIAGNOSIS — H35373 Puckering of macula, bilateral: Secondary | ICD-10-CM | POA: Diagnosis not present

## 2020-03-18 ENCOUNTER — Telehealth: Payer: Self-pay | Admitting: Family Medicine

## 2020-03-18 NOTE — Telephone Encounter (Signed)
Left message for patient to schedule Annual Wellness Visit.  Please schedule with Nurse Health Advisor Martha Stanley, RN at [facility]  °

## 2020-03-22 ENCOUNTER — Telehealth (INDEPENDENT_AMBULATORY_CARE_PROVIDER_SITE_OTHER): Payer: Medicare HMO | Admitting: Family Medicine

## 2020-03-22 DIAGNOSIS — R300 Dysuria: Secondary | ICD-10-CM | POA: Diagnosis not present

## 2020-03-22 DIAGNOSIS — R350 Frequency of micturition: Secondary | ICD-10-CM | POA: Diagnosis not present

## 2020-03-22 DIAGNOSIS — R34 Anuria and oliguria: Secondary | ICD-10-CM | POA: Diagnosis not present

## 2020-03-22 DIAGNOSIS — R310 Gross hematuria: Secondary | ICD-10-CM

## 2020-03-22 DIAGNOSIS — R319 Hematuria, unspecified: Secondary | ICD-10-CM | POA: Diagnosis not present

## 2020-03-22 NOTE — Patient Instructions (Signed)
Please seek in person care today as we discussed, either with your urologist if you are able to get through to them or at one of the urgent cares as we discussed.  I hope you are feeling better soon!  It was nice to meet you today. I help [facility] out with telemedicine visits on Tuesdays and Thursdays and am available for visits on those days. If you have any concerns or questions following this visit please schedule a follow up visit with your Primary Care doctor or seek care at a local urgent care clinic to avoid delays in care.

## 2020-03-22 NOTE — Progress Notes (Signed)
Virtual Visit via Video Note  I connected with Stanlee  on 03/22/20 at 11:40 AM EST by a video enabled telemedicine application and verified that I am speaking with the correct person using two identifiers.  Location patient: home, [place] Location provider:work or home office Persons participating in the virtual visit: patient, provider  I discussed the limitations of evaluation and management by telemedicine and the availability of in person appointments. The patient expressed understanding and agreed to proceed.   HPI:  Acute telemedicine visit for Dysuria: -Onset: had episode 1 week ago of urinary frequency, then recurred and was severe last night -Symptoms include: urgency, frequency, ? 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  Past Medical History:  Diagnosis Date  . Abnormal liver enzymes   . Allergic rhinitis   . Allergy    seasonal and environmental  . CAD (coronary artery disease)   . Cataract   . Colon polyps   . Diverticulosis   . ED (erectile dysfunction)   . GERD (gastroesophageal reflux disease)   . Heart murmur    20 yrs ago  . Hemochromatosis   . HTN (hypertension)   . Hyperlipemia   . Meniere's disease   . Prostate cancer ([place])   . Retinal detachment     Past Surgical History:  Procedure Laterality Date  . APPENDECTOMY    . BASAL CELL CARCINOMA EXCISION  2014   behind right knee and back  . cataracts     . COLONOSCOPY  2020  . COLONOSCOPY WITH PROPOFOL N/A 09/23/2018   Procedure: COLONOSCOPY WITH PROPOFOL;  Surgeon: Doran Stabler, MD;  Location: [facility];  Service: Gastroenterology;  Laterality: N/A;  . COLONOSCOPY WITH PROPOFOL N/A 10/26/2019    Procedure: COLONOSCOPY WITH PROPOFOL;  Surgeon: Doran Stabler, MD;  Location: [facility];  Service: Gastroenterology;  Laterality: N/A;  . HEMOSTASIS CLIP PLACEMENT  09/23/2018   Procedure: HEMOSTASIS CLIP PLACEMENT;  Surgeon: Doran Stabler, MD;  Location: [facility];  Service: Gastroenterology;;  . HOT HEMOSTASIS N/A 09/23/2018   Procedure: HOT HEMOSTASIS (ARGON PLASMA COAGULATION/BICAP);  Surgeon: Doran Stabler, MD;  Location: [facility];  Service: Gastroenterology;  Laterality: N/A;  . HOT HEMOSTASIS N/A 10/26/2019   Procedure: HOT HEMOSTASIS (ARGON PLASMA COAGULATION/BICAP);  Surgeon: Doran Stabler, MD;  Location: [facility];  Service: Gastroenterology;  Laterality: N/A;  . POLYPECTOMY    . POLYPECTOMY  09/23/2018   Procedure: POLYPECTOMY;  Surgeon: Doran Stabler, MD;  Location: [facility];  Service: Gastroenterology;;  . POLYPECTOMY  10/26/2019   Procedure: POLYPECTOMY;  Surgeon: Doran Stabler, MD;  [facility]: [facility];  Service: Gastroenterology;;  . PROSTATECTOMY    . RETINAL DETACHMENT SURGERY    . SUBMUCOSAL LIFTING INJECTION  09/23/2018   Procedure: SUBMUCOSAL LIFTING INJECTION;  Surgeon: Doran Stabler, MD;  [facility]: [facility];  Service: Gastroenterology;;  . tripple bypass N/A 12/07/2015     Current Outpatient Medications:  .  aspirin EC 81 MG tablet, Take 81 mg by mouth at bedtime. , Disp: , Rfl:  .  carvedilol (COREG) 6.25 MG tablet, Take 1 tablet (6.25 mg total) by mouth 2 (  two) times daily., Disp: 180 tablet, Rfl: 3 .  fluticasone (FLONASE) 50 MCG/ACT nasal spray, Place 2 sprays into both nostrils daily., Disp: , Rfl:  .  losartan (COZAAR) 100 MG tablet, Take 0.5 tablets (50 mg total) by mouth daily., Disp: 45 tablet, Rfl: 3 .  Polyethyl Glycol-Propyl Glycol (LUBRICANT EYE DROPS) 0.4-0.3 % SOLN, Place 1 drop into both eyes 3 (three) times daily as needed (dry/irritated eyes.)., Disp: , Rfl:  .  potassium chloride (KLOR-CON) 10 MEQ  tablet, Take 1 tablet (10 mEq total) by mouth daily., Disp: 90 tablet, Rfl: 3 .  rosuvastatin (CRESTOR) 20 MG tablet, Take 20 mg by mouth every evening. , Disp: , Rfl:  .  trospium (SANCTURA) 20 MG tablet, Take by mouth., Disp: , Rfl:  .  vitamin E 400 UNIT capsule, Take 800 Units by mouth daily. , Disp: , Rfl:   Current Facility-Administered Medications:  .  0.9 %  sodium chloride infusion, 500 mL, Intravenous, Once, Danis, Kirke Corin, MD  EXAM:  VITALS per patient if applicable:  GENERAL: alert, oriented, appears well and in no acute distress  HEENT: atraumatic, conjunttiva clear, no obvious abnormalities on inspection of external nose and ears  NECK: normal movements of the head and neck  LUNGS: on inspection no signs of respiratory distress, breathing rate appears normal, no obvious gross SOB, gasping or wheezing  CV: no obvious cyanosis  MS: moves all visible extremities without noticeable abnormality  PSYCH/NEURO: pleasant and cooperative, no obvious depression or anxiety, speech and thought processing grossly intact  ASSESSMENT AND PLAN:  Discussed the following assessment and plan:  Dysuria  Gross hematuria  -we discussed possible serious and likely etiologies, options for evaluation and workup, limitations of telemedicine visit vs in person visit, treatment, treatment risks and precautions. Pt prefers to treat via telemedicine empirically rather than in person at this moment.  However, given his current symptoms and history advised he will need evaluation and higher level of care with his urologist or an in person evaluation at minimum.  His primary care office did not have availability today, thus he tried a virtual visit.  After a lengthy discussion of potential etiologies and the need for an exam and likely at minimum urine test, he agrees to seek in person care.  Discussed at length several different urgent care options in his area, in case he is unable to get an  appointment through his urology office today.    I discussed the assessment and treatment plan with the patient. The patient was provided an opportunity to ask questions and all were answered. The patient agreed with the plan and demonstrated an understanding of the instructions.     Lucretia Kern, DO

## 2020-04-01 ENCOUNTER — Ambulatory Visit: Payer: Medicare HMO | Admitting: Family Medicine

## 2020-04-04 NOTE — Progress Notes (Signed)
Subjective:   Jeffrey Stein is a 75 y.o. male who presents for Medicare Annual/Subsequent preventive examination.  Review of Systems     Cardiac Risk Factors include: advanced age (>52men, >45 women);male gender;hypertension;diabetes mellitus;obesity (BMI >30kg/m2)     Objective:    Today's Vitals   04/05/20 0815  BP: 128/80  Pulse: 80  Resp: 16  Temp: (!) 96.8 F (36 C)  TempSrc: Temporal  SpO2: 94%  Weight: 216 lb 9.6 oz (98.2 kg)  Height: 5\' 11"  (1.803 m)   Body mass index is 30.21 kg/m.  Advanced Directives 04/05/2020 10/26/2019 10/08/2018 09/23/2018 09/22/2018  Does Patient Have a Medical Advance Directive? Yes Yes Yes Yes Yes  Type of Paramedic of [place];Living will [place];Living will [place];Living will Healthcare Power of [place];Living will  Does patient want to make changes to medical advance directive? - - No - Patient declined - -  Copy of [place] in Chart? No - copy requested No - copy requested Yes - validated most recent copy scanned in chart (See row information) No - copy requested -    Current Medications (verified) Outpatient Encounter Medications as of 04/05/2020  Medication Sig   aspirin EC 81 MG tablet Take 81 mg by mouth at bedtime.    carvedilol (COREG) 6.25 MG tablet Take 1 tablet (6.25 mg total) by mouth 2 (two) times daily.   fluticasone (FLONASE) 50 MCG/ACT nasal spray Place 2 sprays into both nostrils daily.   losartan (COZAAR) 25 MG tablet Take 25 mg by mouth daily.   Polyethyl Glycol-Propyl Glycol (LUBRICANT EYE DROPS) 0.4-0.3 % SOLN Place 1 drop into both eyes 3 (three) times daily as needed (dry/irritated eyes.).   rosuvastatin (CRESTOR) 20 MG tablet Take 20 mg by mouth every evening.    trospium (SANCTURA) 20 MG tablet Take by mouth.   vitamin E 400 UNIT capsule Take 800 Units by mouth daily.    potassium chloride  (KLOR-CON) 10 MEQ tablet Take 1 tablet (10 mEq total) by mouth daily. (Patient not taking: Reported on 04/05/2020)   [DISCONTINUED] losartan (COZAAR) 100 MG tablet Take 0.5 tablets (50 mg total) by mouth daily. (Patient not taking: Reported on 04/05/2020)   Facility-Administered Encounter Medications as of 04/05/2020  Medication   0.9 %  sodium chloride infusion    Allergies (verified) Penicillin g, Atorvastatin, Iodinated diagnostic agents, Omeprazole, and Shellfish allergy   History: Past Medical History:  Diagnosis Date   Abnormal liver enzymes    Allergic rhinitis    Allergy    seasonal and environmental   CAD (coronary artery disease)    Cataract    Colon polyps    Diverticulosis    ED (erectile dysfunction)    GERD (gastroesophageal reflux disease)    Heart murmur    20 yrs ago   Hemochromatosis    HTN (hypertension)    Hyperlipemia    Meniere's disease    Prostate cancer ([place])    Retinal detachment    Past Surgical History:  Procedure Laterality Date   APPENDECTOMY     BASAL CELL CARCINOMA EXCISION  2014   behind right knee and back   cataracts      COLONOSCOPY  2020   COLONOSCOPY WITH PROPOFOL N/A 09/23/2018   Procedure: COLONOSCOPY WITH PROPOFOL;  Surgeon: Doran Stabler, MD;  Location: [facility];  Service: Gastroenterology;  Laterality: N/A;   COLONOSCOPY WITH PROPOFOL N/A 10/26/2019  Procedure: COLONOSCOPY WITH PROPOFOL;  Surgeon: Doran Stabler, MD;  Location: [facility];  Service: Gastroenterology;  Laterality: N/A;   HEMOSTASIS CLIP PLACEMENT  09/23/2018   Procedure: HEMOSTASIS CLIP PLACEMENT;  Surgeon: Doran Stabler, MD;  Location: [facility];  Service: Gastroenterology;;   HOT HEMOSTASIS N/A 09/23/2018   Procedure: HOT HEMOSTASIS (ARGON PLASMA COAGULATION/BICAP);  Surgeon: Doran Stabler, MD;  Location: [facility];  Service: Gastroenterology;  Laterality: N/A;   HOT HEMOSTASIS N/A 10/26/2019   Procedure: HOT  HEMOSTASIS (ARGON PLASMA COAGULATION/BICAP);  Surgeon: Doran Stabler, MD;  Location: [facility];  Service: Gastroenterology;  Laterality: N/A;   POLYPECTOMY     POLYPECTOMY  09/23/2018   Procedure: POLYPECTOMY;  Surgeon: Doran Stabler, MD;  Location: [facility];  Service: Gastroenterology;;   POLYPECTOMY  10/26/2019   Procedure: POLYPECTOMY;  Surgeon: Doran Stabler, MD;  Location: [facility];  Service: Gastroenterology;;   PROSTATECTOMY     RETINAL DETACHMENT SURGERY     SUBMUCOSAL LIFTING INJECTION  09/23/2018   Procedure: SUBMUCOSAL LIFTING INJECTION;  Surgeon: Doran Stabler, MD;  Location: [facility];  Service: Gastroenterology;;   tripple bypass N/A 12/07/2015   Family History  Problem Relation Age of Onset   Colon cancer Mother    Emphysema Father    Colon cancer Brother    Stomach cancer Neg Hx    Pancreatic cancer Neg Hx    Esophageal cancer Neg Hx    Rectal cancer Neg Hx    Colon polyps Neg Hx    Social History   Socioeconomic History   Marital status: Married    Spouse name: Doffing   Number of children: Not on file   Years of education: Not on file   Highest education level: Not on file  Occupational History   Not on file  Tobacco Use   Smoking status: Never Smoker   Smokeless tobacco: Never Used  Vaping Use   Vaping Use: Never used  Substance and Sexual Activity   Alcohol use: Yes    Comment: 2 cocktails per day    Drug use: No   Sexual activity: Not on file  Other Topics Concern   Not on file  Social History Narrative   Not on file   Social Determinants of Health   Financial Resource Strain: Low Risk    Difficulty of Paying Living Expenses: Not hard at all  Food Insecurity: No Food Insecurity   Worried About Charity fundraiser in the Last Year: Never true   [place] in the Last Year: Never true  Transportation Needs: No Transportation Needs   Lack of Transportation (Medical): No   Lack  of Transportation (Non-Medical): No  Physical Activity: Inactive   Days of Exercise per Week: 0 days   Minutes of Exercise per Session: 30 min  Stress: No Stress Concern Present   Feeling of Stress : Not at all  Social Connections: Socially Integrated   Frequency of Communication with Friends and Family: More than three times a week   Frequency of Social Gatherings with Friends and Family: More than three times a week   Attends Religious Services: More than 4 times per year   Active Member of [employer] or Organizations: Yes   Attends Archivist Meetings: 1 to 4 times per year   Marital Status: Married    Tobacco Counseling Counseling given: Not Answered   Clinical Intake:  Pre-visit preparation completed: Yes  Pain : 0-10 Pain Type: Acute pain Pain Location: Other (Comment) (pain with urination-seeing urologist) Pain Descriptors / Indicators: Burning Pain Onset: 1 to 4 weeks ago Pain Frequency: Intermittent     Nutritional Status: BMI > 30  Obese Nutritional Risks: None Diabetes: No  How often do you need to have someone help you when you read instructions, pamphlets, or other written materials from your doctor or pharmacy?: 1 - Never  Diabetic?No  Interpreter Needed?: No  Information entered by :: Caroleen Hamman LPN   Activities of Daily Living In your present state of health, do you have any difficulty performing the following activities: 04/05/2020  Hearing? N  Vision? N  Difficulty concentrating or making decisions? N  Walking or climbing stairs? N  Dressing or bathing? N  Doing errands, shopping? N  Preparing Food and eating ? N  Using the Toilet? N  In the past six months, have you accidently leaked urine? N  Do you have problems with loss of bowel control? N  Managing your Medications? N  Managing your Finances? N  Housekeeping or managing your Housekeeping? N  Some recent data might be hidden    Patient Care Team: Libby Maw, MD as PCP - General (Family Medicine)  Indicate any recent Medical Services you may have received from other than [facility] providers in the past year (date may be approximate).     Assessment:   This is a routine wellness examination for Jeffrey Stein.  Hearing/Vision screen  Hearing Screening   125Hz  250Hz  500Hz  1000Hz  2000Hz  3000Hz  4000Hz  6000Hz  8000Hz   Right ear:           Left ear:           Comments: Biltaeral hearing aids  Vision Screening Comments: Wears contacts Last eye exam-02/2020-Dr. Grewall  Dietary issues and exercise activities discussed: Current Exercise Habits: Home exercise routine, Type of exercise: walking, Time (Minutes): 30, Frequency (Times/Week): 1, Weekly Exercise (Minutes/Week): 30, Intensity: Mild, Exercise limited by: None identified  Goals     Patient Stated     Increase activity      Depression Screen PHQ 2/9 Scores 04/05/2020 11/24/2019 10/08/2018 10/30/2016 07/21/2015  PHQ - 2 Score 0 0 0 0 0  PHQ- 9 Score - 1 - - -    Fall Risk Fall Risk  04/05/2020 11/24/2019 10/08/2018 10/30/2016 07/21/2015  Falls in the past year? 1 1 0 Yes No  Number falls in past yr: 0 1 - 1 -  Injury with Fall? 0 0 - No -  Comment - bruised left side of face. - - -  Follow up Falls prevention discussed - - - -    FALL RISK PREVENTION PERTAINING TO THE HOME:  Any stairs in or around the home? Yes  If so, are there any without handrails? No  Home free of loose throw rugs in walkways, pet beds, electrical cords, etc? Yes  Adequate lighting in your home to reduce risk of falls? Yes   ASSISTIVE DEVICES UTILIZED TO PREVENT FALLS:  Life alert? No  Use of a cane, walker or w/c? No  Grab bars in the bathroom? No  Shower chair or bench in shower? No  Elevated toilet seat or a handicapped toilet? No   TIMED UP AND GO:  Was the test performed? Yes .  Length of time to ambulate 10 feet: 9 sec.   Gait steady and fast without use of assistive device  Cognitive Function:Normal  cognitive status assessed by direct observation  by this Nurse Health Advisor. No abnormalities found.       6CIT Screen 10/08/2018  What Year? 0 points  What month? 0 points  What time? 0 points  Count back from 20 0 points  Months in reverse 0 points  Repeat phrase 0 points  Total Score 0    Immunizations Immunization History  Administered Date(s) Administered   Fluad Quad(high Dose 65+) 10/15/2018   Influenza, High Dose Seasonal PF 10/30/2016, 11/25/2017   Influenza-Unspecified 11/19/2016, 11/06/2019   PFIZER(Purple Top)SARS-COV-2 Vaccination 03/01/2019, 03/23/2019, 11/06/2019   Pneumococcal Conjugate-13 12/10/2014   Pneumococcal Polysaccharide-23 08/20/2011   Zoster Recombinat (Shingrix) 09/16/2017, 11/25/2017    TDAP status: Due, Education has been provided regarding the importance of this vaccine. Advised may receive this vaccine at local pharmacy or Health Dept. Aware to provide a copy of the vaccination record if obtained from local pharmacy or Health Dept. Verbalized acceptance and understanding.  Flu Vaccine status: Up to date  Pneumococcal vaccine status: Up to date  Covid-19 vaccine status: Completed vaccines  Qualifies for Shingles Vaccine? No   Zostavax completed No   Shingrix Completed?: Yes  Screening Tests Health Maintenance  Topic Date Due   TETANUS/TDAP  Never done   COVID-19 Vaccine (4 - Booster for Pfizer series) 05/06/2020   COLONOSCOPY (Pts 45-43yrs Insurance coverage will need to be confirmed)  10/26/2022   INFLUENZA VACCINE  Completed   Hepatitis C Screening  Completed   PNA vac Low Risk Adult  Completed   HPV VACCINES  Aged Out    Health Maintenance  Health Maintenance Due  Topic Date Due   TETANUS/TDAP  Never done    Colorectal cancer screening: Type of screening: Colonoscopy. Completed 10/26/2019. Repeat every 3 years  Lung Cancer Screening: (Low Dose CT Chest recommended if Age 98-80 years, 30 pack-year currently  smoking OR have quit w/in 15years.) does not qualify.    Additional Screening:  Hepatitis C Screening:  Completed 10/15/2018  Vision Screening: Recommended annual ophthalmology exams for early detection of glaucoma and other disorders of the eye. Is the patient up to date with their annual eye exam?  Yes  Who is the provider or what is the name of the office in which the patient attends annual eye exams? Dr. Norris Cross   Dental Screening: Recommended annual dental exams for proper oral hygiene  Community Resource Referral / Chronic Care Management: CRR required this visit?  No   CCM required this visit?  No      Plan:     I have personally reviewed and noted the following in the patients chart:    Medical and social history  Use of alcohol, tobacco or illicit drugs   Current medications and supplements  Functional ability and status  Nutritional status  Physical activity  Advanced directives  List of other physicians  Hospitalizations, surgeries, and ER visits in previous 12 months  Vitals  Screenings to include cognitive, depression, and falls  Referrals and appointments  In addition, I have reviewed and discussed with patient certain preventive protocols, quality metrics, and best practice recommendations. A written personalized care plan for preventive services as well as general preventive health recommendations were provided to patient.   Patient to access avs on mychart.  Marta Antu, LPN   04/13/2503  Nurse Health Advisor  Nurse Notes: None

## 2020-04-05 ENCOUNTER — Ambulatory Visit (INDEPENDENT_AMBULATORY_CARE_PROVIDER_SITE_OTHER): Payer: Medicare HMO

## 2020-04-05 ENCOUNTER — Other Ambulatory Visit: Payer: Self-pay

## 2020-04-05 VITALS — BP 128/80 | HR 80 | Temp 96.8°F | Resp 16 | Ht 71.0 in | Wt 216.6 lb

## 2020-04-05 DIAGNOSIS — R350 Frequency of micturition: Secondary | ICD-10-CM | POA: Diagnosis not present

## 2020-04-05 DIAGNOSIS — R300 Dysuria: Secondary | ICD-10-CM | POA: Diagnosis not present

## 2020-04-05 DIAGNOSIS — Z0000 Encounter for general adult medical examination without abnormal findings: Secondary | ICD-10-CM

## 2020-04-05 NOTE — Patient Instructions (Signed)
Jeffrey Stein , Thank you for taking time to come for your Medicare Wellness Visit. I appreciate your ongoing commitment to your health goals. Please review the following plan we discussed and let me know if I can assist you in the future.   Screening recommendations/referrals: Colonoscopy: Completed 10/26/2019-Due 10/26/2022 Recommended yearly ophthalmology/optometry visit for glaucoma screening and checkup Recommended yearly dental visit for hygiene and checkup  Vaccinations: Influenza vaccine: Up to date Pneumococcal vaccine: Completed vaccines Tdap vaccine: Discuss with pharmacy Shingles vaccine: Completed vaccines  Covid-19: Completed vaccines  Advanced directives: Please bring a copy for your chart  Conditions/risks identified: See problem list  Next appointment: Follow up in one year for your annual wellness visit. 04/11/2021 @ 10:30  Preventive Care 75 Years and Older, Male Preventive care refers to lifestyle choices and visits with your health care provider that can promote health and wellness. What does preventive care include?  A yearly physical exam. This is also called an annual well check.  Dental exams once or twice a year.  Routine eye exams. Ask your health care provider how often you should have your eyes checked.  Personal lifestyle choices, including:  Daily care of your teeth and gums.  Regular physical activity.  Eating a healthy diet.  Avoiding tobacco and drug use.  Limiting alcohol use.  Practicing safe sex.  Taking low doses of aspirin every day.  Taking vitamin and mineral supplements as recommended by your health care provider. What happens during an annual well check? The services and screenings done by your health care provider during your annual well check will depend on your age, overall health, lifestyle risk factors, and family history of disease. Counseling  Your health care provider may ask you questions about your:  Alcohol  use.  Tobacco use.  Drug use.  Emotional well-being.  Home and relationship well-being.  Sexual activity.  Eating habits.  History of falls.  Memory and ability to understand (cognition).  Work and work Statistician. Screening  You may have the following tests or measurements:  Height, weight, and BMI.  Blood pressure.  Lipid and cholesterol levels. These may be checked every 5 years, or more frequently if you are over 27 years old.  Skin check.  Lung cancer screening. You may have this screening every year starting at age 26 if you have a 30-pack-year history of smoking and currently smoke or have quit within the past 15 years.  Fecal occult blood test (FOBT) of the stool. You may have this test every year starting at age 68.  Flexible sigmoidoscopy or colonoscopy. You may have a sigmoidoscopy every 5 years or a colonoscopy every 10 years starting at age 62.  Prostate cancer screening. Recommendations will vary depending on your family history and other risks.  Hepatitis C blood test.  Hepatitis B blood test.  Sexually transmitted disease (STD) testing.  Diabetes screening. This is done by checking your blood sugar (glucose) after you have not eaten for a while (fasting). You may have this done every 1-3 years.  Abdominal aortic aneurysm (AAA) screening. You may need this if you are a current or former smoker.  Osteoporosis. You may be screened starting at age 68 if you are at high risk. Talk with your health care provider about your test results, treatment options, and if necessary, the need for more tests. Vaccines  Your health care provider may recommend certain vaccines, such as:  Influenza vaccine. This is recommended every year.  Tetanus, diphtheria, and acellular pertussis (  Tdap, Td) vaccine. You may need a Td booster every 10 years.  Zoster vaccine. You may need this after age 33.  Pneumococcal 13-valent conjugate (PCV13) vaccine. One dose is  recommended after age 31.  Pneumococcal polysaccharide (PPSV23) vaccine. One dose is recommended after age 63. Talk to your health care provider about which screenings and vaccines you need and how often you need them. This information is not intended to replace advice given to you by your health care provider. Make sure you discuss any questions you have with your health care provider. Document Released: 02/18/2015 Document Revised: 10/12/2015 Document Reviewed: 11/23/2014 Elsevier Interactive Patient Education  2017 [place] Prevention in the Home Falls can cause injuries. They can happen to people of all ages. There are many things you can do to make your home safe and to help prevent falls. What can I do on the outside of my home?  Regularly fix the edges of walkways and driveways and fix any cracks.  Remove anything that might make you trip as you walk through a door, such as a raised step or threshold.  Trim any bushes or trees on the path to your home.  Use bright outdoor lighting.  Clear any walking paths of anything that might make someone trip, such as rocks or tools.  Regularly check to see if handrails are loose or broken. Make sure that both sides of any steps have handrails.  Any raised decks and porches should have guardrails on the edges.  Have any leaves, snow, or ice cleared regularly.  Use sand or salt on walking paths during winter.  Clean up any spills in your garage right away. This includes oil or grease spills. What can I do in the bathroom?  Use night lights.  Install grab bars by the toilet and in the tub and shower. Do not use towel bars as grab bars.  Use non-skid mats or decals in the tub or shower.  If you need to sit down in the shower, use a plastic, non-slip stool.  Keep the floor dry. Clean up any water that spills on the floor as soon as it happens.  Remove soap buildup in the tub or shower regularly.  Attach bath mats  securely with double-sided non-slip rug tape.  Do not have throw rugs and other things on the floor that can make you trip. What can I do in the bedroom?  Use night lights.  Make sure that you have a light by your bed that is easy to reach.  Do not use any sheets or blankets that are too big for your bed. They should not hang down onto the floor.  Have a firm chair that has side arms. You can use this for support while you get dressed.  Do not have throw rugs and other things on the floor that can make you trip. What can I do in the kitchen?  Clean up any spills right away.  Avoid walking on wet floors.  Keep items that you use a lot in easy-to-reach places.  If you need to reach something above you, use a strong step stool that has a grab bar.  Keep electrical cords out of the way.  Do not use floor polish or wax that makes floors slippery. If you must use wax, use non-skid floor wax.  Do not have throw rugs and other things on the floor that can make you trip. What can I do with my stairs?  Do  not leave any items on the stairs.  Make sure that there are handrails on both sides of the stairs and use them. Fix handrails that are broken or loose. Make sure that handrails are as long as the stairways.  Check any carpeting to make sure that it is firmly attached to the stairs. Fix any carpet that is loose or worn.  Avoid having throw rugs at the top or bottom of the stairs. If you do have throw rugs, attach them to the floor with carpet tape.  Make sure that you have a light switch at the top of the stairs and the bottom of the stairs. If you do not have them, ask someone to add them for you. What else can I do to help prevent falls?  Wear shoes that:  Do not have high heels.  Have rubber bottoms.  Are comfortable and fit you well.  Are closed at the toe. Do not wear sandals.  If you use a stepladder:  Make sure that it is fully opened. Do not climb a closed  stepladder.  Make sure that both sides of the stepladder are locked into place.  Ask someone to hold it for you, if possible.  Clearly mark and make sure that you can see:  Any grab bars or handrails.  First and last steps.  Where the edge of each step is.  Use tools that help you move around (mobility aids) if they are needed. These include:  Canes.  Walkers.  Scooters.  Crutches.  Turn on the lights when you go into a dark area. Replace any light bulbs as soon as they burn out.  Set up your furniture so you have a clear path. Avoid moving your furniture around.  If any of your floors are uneven, fix them.  If there are any pets around you, be aware of where they are.  Review your medicines with your doctor. Some medicines can make you feel dizzy. This can increase your chance of falling. Ask your doctor what other things that you can do to help prevent falls. This information is not intended to replace advice given to you by your health care provider. Make sure you discuss any questions you have with your health care provider. Document Released: 11/18/2008 Document Revised: 06/30/2015 Document Reviewed: 02/26/2014 Elsevier Interactive Patient Education  2017 [employer].

## 2020-04-07 ENCOUNTER — Ambulatory Visit: Payer: Medicare HMO | Admitting: Family Medicine

## 2020-04-12 DIAGNOSIS — R310 Gross hematuria: Secondary | ICD-10-CM | POA: Diagnosis not present

## 2020-04-12 DIAGNOSIS — C61 Malignant neoplasm of prostate: Secondary | ICD-10-CM | POA: Diagnosis not present

## 2020-04-19 DIAGNOSIS — I10 Essential (primary) hypertension: Secondary | ICD-10-CM | POA: Diagnosis not present

## 2020-04-19 DIAGNOSIS — E782 Mixed hyperlipidemia: Secondary | ICD-10-CM | POA: Diagnosis not present

## 2020-04-19 DIAGNOSIS — I350 Nonrheumatic aortic (valve) stenosis: Secondary | ICD-10-CM | POA: Diagnosis not present

## 2020-04-19 DIAGNOSIS — I9789 Other postprocedural complications and disorders of the circulatory system, not elsewhere classified: Secondary | ICD-10-CM | POA: Diagnosis not present

## 2020-04-19 DIAGNOSIS — I4891 Unspecified atrial fibrillation: Secondary | ICD-10-CM | POA: Diagnosis not present

## 2020-04-19 DIAGNOSIS — I2510 Atherosclerotic heart disease of native coronary artery without angina pectoris: Secondary | ICD-10-CM | POA: Diagnosis not present

## 2020-04-28 DIAGNOSIS — H33311 Horseshoe tear of retina without detachment, right eye: Secondary | ICD-10-CM | POA: Diagnosis not present

## 2020-04-28 DIAGNOSIS — H04123 Dry eye syndrome of bilateral lacrimal glands: Secondary | ICD-10-CM | POA: Diagnosis not present

## 2020-04-28 DIAGNOSIS — H35373 Puckering of macula, bilateral: Secondary | ICD-10-CM | POA: Diagnosis not present

## 2020-04-28 DIAGNOSIS — H353131 Nonexudative age-related macular degeneration, bilateral, early dry stage: Secondary | ICD-10-CM | POA: Diagnosis not present

## 2020-04-28 DIAGNOSIS — H43811 Vitreous degeneration, right eye: Secondary | ICD-10-CM | POA: Diagnosis not present

## 2020-04-28 DIAGNOSIS — H33022 Retinal detachment with multiple breaks, left eye: Secondary | ICD-10-CM | POA: Diagnosis not present

## 2020-05-04 DIAGNOSIS — N3040 Irradiation cystitis without hematuria: Secondary | ICD-10-CM | POA: Diagnosis not present

## 2020-05-04 DIAGNOSIS — C61 Malignant neoplasm of prostate: Secondary | ICD-10-CM | POA: Diagnosis not present

## 2020-05-04 DIAGNOSIS — S3991XA Unspecified injury of abdomen, initial encounter: Secondary | ICD-10-CM | POA: Diagnosis not present

## 2020-05-04 DIAGNOSIS — N5231 Erectile dysfunction following radical prostatectomy: Secondary | ICD-10-CM | POA: Diagnosis not present

## 2020-05-04 DIAGNOSIS — X58XXXA Exposure to other specified factors, initial encounter: Secondary | ICD-10-CM | POA: Diagnosis not present

## 2020-05-04 DIAGNOSIS — R310 Gross hematuria: Secondary | ICD-10-CM | POA: Diagnosis not present

## 2020-06-13 ENCOUNTER — Telehealth (INDEPENDENT_AMBULATORY_CARE_PROVIDER_SITE_OTHER): Payer: Medicare HMO | Admitting: Nurse Practitioner

## 2020-06-13 ENCOUNTER — Encounter: Payer: Self-pay | Admitting: Nurse Practitioner

## 2020-06-13 VITALS — Temp 98.5°F

## 2020-06-13 DIAGNOSIS — U071 COVID-19: Secondary | ICD-10-CM

## 2020-06-13 MED ORDER — MOLNUPIRAVIR EUA 200MG CAPSULE: 4.0000 | ORAL_CAPSULE | Freq: Two times a day (BID) | ORAL | 0 refills | Status: AC

## 2020-06-13 NOTE — Progress Notes (Signed)
Virtual Visit via Video Note  I connected with@ on 06/13/20 at 11:30 AM EDT by a video enabled telemedicine application and verified that I am speaking with the correct person using two identifiers.  Location: Patient:Home Provider: Office Participants: patient and provider  I discussed the limitations of evaluation and management by telemedicine and the availability of in person appointments. I also discussed with the patient that there may be a patient responsible charge related to this service. The patient expressed understanding and agreed to proceed.  [id number] home COVID test on 06/11/2020  History of Present Illness: Mr. Colden reports his wife developed hoarseness, sorethroat and cough on 06/10/2020. She tested positive on 06/11/2020 via urgent care clinic. She attended a Luncheon on 06/08/2020 and 3 other people from luncheon have also tested positive. Mr. Barish had cough and nasal congestion for last 1.5-2weeks. He attributed this to seasonal allergies and treated with flonase and coricidin. His symptoms have been stable. He denies any fever or chest pain or SOB or dizziness or fatigue or palpitations. He has had 3COVID vaccines, last dose 11/2019.   Observations/Objective: Physical Exam Pulmonary:     Effort: Pulmonary effort is normal.  Neurological:     Mental Status: He is alert and oriented to person, place, and time.    Assessment and Plan: Nithin was seen today for covid positive.  Diagnoses and all orders for this visit:  COVID-19 -     molnupiravir EUA 200 mg CAPS; Take 4 capsules (800 mg total) by mouth 2 (two) times daily for 5 days.  We discussed possible side effects and benefits of molnupiravir. He was informed it is still under investigation. He agreed to take medication.  Follow Up Instructions: Provided printed information on symptom management Continue flonase and coricidin as needed Call office if symptoms worsen Go to ED if you develop  chest pain, dizziness, SOB, or unable to maintain adequate oral hydration.   I discussed the assessment and treatment plan with the patient. The patient was provided an opportunity to ask questions and all were answered. The patient agreed with the plan and demonstrated an understanding of the instructions.   The patient was advised to call back or seek an in-person evaluation if the symptoms worsen or if the condition fails to improve as anticipated.  Wilfred Lacy, NP

## 2020-06-13 NOTE — Patient Instructions (Addendum)
Continue flonase and coricidin as needed Call office if symptoms worsen Go to ED if you develop chest pain, dizziness, SOB, or unable to maintain adequate oral hydration.  COVID-19 Quarantine vs. Isolation QUARANTINE keeps someone who was in close contact with someone who has COVID-19 away from others. Quarantine if you have been in close contact with someone who has COVID-19, unless you have been fully vaccinated. If you are fully vaccinated  You do NOT need to quarantine unless they have symptoms  Get tested 3-5 days after your exposure, even if you don't have symptoms  Wear a mask indoors in public for 14 days following exposure or until your test result is negative If you are not fully vaccinated  Stay home for 14 days after your last contact with a person who has COVID-19  Watch for fever (100.40F), cough, shortness of breath, or other symptoms of COVID-19  If possible, stay away from people you live with, especially people who are at higher risk for getting very sick from COVID-19  Contact your local public health department for options in your area to possibly shorten your quarantine ISOLATION keeps someone who is sick or tested positive for COVID-19 without symptoms away from others, even in their own home. People who are in isolation should stay home and stay in a specific "sick room" or area and use a separate bathroom (if available). If you are sick and think or know you have COVID-19 Stay home until after  At least 10 days since symptoms first appeared and  At least 24 hours with no fever without the use of fever-reducing medications and  Symptoms have improved If you tested positive for COVID-19 but do not have symptoms  Stay home until after 10 days have passed since your positive viral test  If you develop symptoms after testing positive, follow the steps above for those who are sick [email or web address] 11/02/2019 This information is not intended to replace  advice given to you by your health care provider. Make sure you discuss any questions you have with your health care provider. Document Revised: 12/07/2019 Document Reviewed: 12/07/2019 Elsevier Patient Education  2021 [place]: What to Do if You Are Sick If you have a fever, cough or other symptoms, you might have COVID-19. Most people have mild illness and are able to recover at home. If you are sick:  Keep track of your symptoms.  If you have an emergency warning sign (including trouble breathing), call 911. Steps to help prevent the spread of COVID-19 if you are sick If you are sick with COVID-19 or think you might have COVID-19, follow the steps below to care for yourself and to help protect other people in your home and community. Stay home except to get medical care  Stay home. Most people with COVID-19 have mild illness and can recover at home without medical care. Do not leave your home, except to get medical care. Do not visit public areas.  Take care of yourself. Get rest and stay hydrated. Take over-the-counter medicines, such as acetaminophen, to help you feel better.  Stay in touch with your doctor. Call before you get medical care. Be sure to get care if you have trouble breathing, or have any other emergency warning signs, or if you think it is an emergency.  Avoid public transportation, ride-sharing, or taxis. Separate yourself from other people As much as possible, stay in a specific room and away from other people and pets in your home. If possible,  you should use a separate bathroom. If you need to be around other people or animals in or outside of the home, wear a mask. Tell your close contactsthat they may have been exposed to COVID-19. An infected person can spread COVID-19 starting 48 hours (or 2 days) before the person has any symptoms or tests positive. By letting your close contacts know they may have been exposed to COVID-19, you are helping to protect  everyone.  Additional guidance is available for those living in close quarters and shared housing.  See COVID-19 and Animals if you have questions about pets.  If you are diagnosed with COVID-19, someone from the health department may call you. Answer the call to slow the spread. Monitor your symptoms  Symptoms of COVID-19 include fever, cough, or other symptoms.  Follow care instructions from your healthcare provider and local health department. Your local health authorities may give instructions on checking your symptoms and reporting information. When to seek emergency medical attention Look for emergency warning signs* for COVID-19. If someone is showing any of these signs, seek emergency medical care immediately:  Trouble breathing  Persistent pain or pressure in the chest  New confusion  Inability to wake or stay awake  Pale, gray, or blue-colored skin, lips, or nail beds, depending on skin tone *This list is not all possible symptoms. Please call your medical provider for any other symptoms that are severe or concerning to you. Call 911 or call ahead to your local emergency facility: Notify the operator that you are seeking care for someone who has or may have COVID-19. Call ahead before visiting your doctor  Call ahead. Many medical visits for routine care are being postponed or done by phone or telemedicine.  If you have a medical appointment that cannot be postponed, call your doctor's office, and tell them you have or may have COVID-19. This will help the office protect themselves and other patients. Get  tested  If you have symptoms of COVID-19, get tested. While waiting for test results, you stay away from others, including staying apart from those living in your household.  You can visit your state, tribal, local, and territorialhealth department's website to look for the latest local information on testing sites. If you are sick, wear a mask over your nose and  mouth  You should wear a mask over your nose and mouth if you must be around other people or animals, including pets (even at home).  You don't need to wear the mask if you are alone. If you can't put on a mask (because of trouble breathing, for example), cover your coughs and sneezes in some other way. Try to stay at least 6 feet away from other people. This will help protect the people around you.  Masks should not be placed on young children under age 27 years, anyone who has trouble breathing, or anyone who is not able to remove the mask without help. Note: During the COVID-19 pandemic, medical grade facemasks are reserved for healthcare workers and some first responders. Cover your coughs and sneezes  Cover your mouth and nose with a tissue when you cough or sneeze.  Throw away used tissues in a lined trash can.  Immediately wash your hands with soap and water for at least 20 seconds. If soap and water are not available, clean your hands with an alcohol-based hand sanitizer that contains at least 60% alcohol. Clean your hands often  Wash your hands often with soap and water for  at least 20 seconds. This is especially important after blowing your nose, coughing, or sneezing; going to the bathroom; and before eating or preparing food.  Use hand sanitizer if soap and water are not available. Use an alcohol-based hand sanitizer with at least 60% alcohol, covering all surfaces of your hands and rubbing them together until they feel dry.  Soap and water are the best option, especially if hands are visibly dirty.  Avoid touching your eyes, nose, and mouth with unwashed hands.  Handwashing Tips Avoid sharing personal household items  Do not share dishes, drinking glasses, cups, eating utensils, towels, or bedding with other people in your home.  Wash these items thoroughly after using them with soap and water or put in the dishwasher. Clean all "high-touch" surfaces everyday  Clean and  disinfect high-touch surfaces in your "sick room" and bathroom; wear disposable gloves. Let someone else clean and disinfect surfaces in common areas, but you should clean your bedroom and bathroom, if possible.  If a caregiver or other person needs to clean and disinfect a sick person's bedroom or bathroom, they should do so on an as-needed basis. The caregiver/other person should wear a mask and disposable gloves prior to cleaning. They should wait as long as possible after the person who is sick has used the bathroom before coming in to clean and use the bathroom. ? High-touch surfaces include phones, remote controls, counters, tabletops, doorknobs, bathroom fixtures, toilets, keyboards, tablets, and bedside tables.  Clean and disinfect areas that may have blood, stool, or body fluids on them.  Use household cleaners and disinfectants. Clean the area or item with soap and water or another detergent if it is dirty. Then, use a household disinfectant. ? Be sure to follow the instructions on the label to ensure safe and effective use of the product. Many products recommend keeping the surface wet for several minutes to ensure germs are killed. Many also recommend precautions such as wearing gloves and making sure you have good ventilation during use of the product. ? Use a product from [employer] List N: Disinfectants for Coronavirus (SFSEL-95). ? Complete Disinfection Guidance When you can be around others after being sick with COVID-19 Deciding when you can be around others is different for different situations. Find out when you can safely end home isolation. For any additional questions about your care, contact your healthcare provider or state or local health department. 04/22/2019 Content source: [facility] ([facility]), Division of Viral Diseases This information is not intended to replace advice given to you by your health care provider. Make sure you  discuss any questions you have with your health care provider. Document Revised: 12/07/2019 Document Reviewed: 12/07/2019 Elsevier Patient Education  2021 [employer].

## 2020-08-26 ENCOUNTER — Telehealth: Payer: Self-pay | Admitting: Family Medicine

## 2020-08-26 NOTE — Telephone Encounter (Signed)
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 Please advise

## 2020-08-31 NOTE — Telephone Encounter (Signed)
New prescription request sent to Provider. Will call patient when approval or denial comes through from Provider.

## 2020-08-31 NOTE — Telephone Encounter (Signed)
Please advise message below last OV 06/13/20 with [name] for covid symptoms last OV with PCP October 2021. Okay to sent in epipen for shellfish allergy?

## 2020-08-31 NOTE — Telephone Encounter (Signed)
Pt would like a response on this tomorrow if possible, He said its ridiculous that he hasn't heard anything yet. I did explain to him that Dr Ethelene Hal was out on Friday and Monday but he didn't think that was a good enough reason.

## 2020-09-01 DIAGNOSIS — H35373 Puckering of macula, bilateral: Secondary | ICD-10-CM | POA: Diagnosis not present

## 2020-09-01 DIAGNOSIS — H353131 Nonexudative age-related macular degeneration, bilateral, early dry stage: Secondary | ICD-10-CM | POA: Diagnosis not present

## 2020-09-01 DIAGNOSIS — H33022 Retinal detachment with multiple breaks, left eye: Secondary | ICD-10-CM | POA: Diagnosis not present

## 2020-09-01 DIAGNOSIS — H33311 Horseshoe tear of retina without detachment, right eye: Secondary | ICD-10-CM | POA: Diagnosis not present

## 2020-09-01 DIAGNOSIS — H43811 Vitreous degeneration, right eye: Secondary | ICD-10-CM | POA: Diagnosis not present

## 2020-09-01 DIAGNOSIS — H04123 Dry eye syndrome of bilateral lacrimal glands: Secondary | ICD-10-CM | POA: Diagnosis not present

## 2020-09-01 NOTE — Telephone Encounter (Signed)
Called patient left detailed message on identified VM informed patient that Dr. Ethelene Hal was not in the office the day that patient requested referral/epipen and the Dr. Ethelene Hal did not receive the message until yesterday. Asked patient to give [place] a call if interested in a referral to allergist or if he would like to come in the office for evaluation.

## 2020-09-02 NOTE — Telephone Encounter (Signed)
Appointment scheduled for evaluation for epipen

## 2020-09-20 ENCOUNTER — Ambulatory Visit (INDEPENDENT_AMBULATORY_CARE_PROVIDER_SITE_OTHER): Payer: Medicare HMO | Admitting: Family Medicine

## 2020-09-20 ENCOUNTER — Encounter: Payer: Self-pay | Admitting: Family Medicine

## 2020-09-20 ENCOUNTER — Other Ambulatory Visit: Payer: Self-pay

## 2020-09-20 VITALS — BP 130/82 | HR 77 | Temp 97.3°F | Ht 71.0 in | Wt 209.4 lb

## 2020-09-20 DIAGNOSIS — Z91013 Allergy to seafood: Secondary | ICD-10-CM | POA: Diagnosis not present

## 2020-09-20 DIAGNOSIS — T753XXA Motion sickness, initial encounter: Secondary | ICD-10-CM | POA: Diagnosis not present

## 2020-09-20 MED ORDER — EPINEPHRINE 0.3 MG/0.3ML IJ SOAJ: 0.3000 mg | INTRAMUSCULAR | 1 refills | Status: DC | PRN

## 2020-09-20 MED ORDER — MECLIZINE HCL 50 MG PO TABS: 50.0000 mg | ORAL_TABLET | Freq: Three times a day (TID) | ORAL | 0 refills | Status: DC | PRN

## 2020-09-20 NOTE — Progress Notes (Signed)
Established Patient Office Visit  Subjective:  Patient ID: Jeffrey Stein, male    DOB: Mar 15, 1945  Age: 75 y.o. MRN: [id number]  CC:  Chief Complaint  Patient presents with   Follow-up    Discuss possible epi-pen     HPI Jeffrey Stein presents for advice about his shellfish allergy.  Able to consume most shellfish except for scallops.  He had developed nausea and vomiting and lightheadedness the last time he ate scallops.  He is scheduled for a [place] cruise in the next few weeks and also suffers from motion sickness.  Pills that were given last time were effective.  He has seen an allergist in the past and has multiple allergies including 1 to dogs.  Visit friends with a dog and developed swelling in his eyes.  He has no history of hives, swelling in throat or difficulty breathing.  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  Vision is still good.  He has no nighttime visual difficulties when driving.  Past Medical History:  Diagnosis Date   Abnormal liver enzymes    Allergic rhinitis    Allergy    seasonal and environmental   CAD (coronary artery disease)    Cataract    Colon polyps    Diverticulosis    ED (erectile dysfunction)    GERD (gastroesophageal reflux disease)    Heart murmur    20 yrs ago   Hemochromatosis    HTN (hypertension)    Hyperlipemia    Meniere's disease    Prostate cancer ([place])    Retinal detachment     Past Surgical History:  Procedure Laterality Date   APPENDECTOMY     BASAL CELL CARCINOMA EXCISION  2014   behind right knee and back   cataracts      COLONOSCOPY  2020   COLONOSCOPY WITH PROPOFOL N/A 09/23/2018   Procedure: COLONOSCOPY WITH PROPOFOL;  Surgeon: Doran Stabler, MD;  Location: [facility];  Service: Gastroenterology;  Laterality: N/A;   COLONOSCOPY WITH PROPOFOL N/A 10/26/2019   Procedure: COLONOSCOPY WITH PROPOFOL;  Surgeon: Doran Stabler, MD;  Location: [facility];  Service:  Gastroenterology;  Laterality: N/A;   HEMOSTASIS CLIP PLACEMENT  09/23/2018   Procedure: HEMOSTASIS CLIP PLACEMENT;  Surgeon: Doran Stabler, MD;  Location: [facility];  Service: Gastroenterology;;   HOT HEMOSTASIS N/A 09/23/2018   Procedure: HOT HEMOSTASIS (ARGON PLASMA COAGULATION/BICAP);  Surgeon: Doran Stabler, MD;  Location: [facility];  Service: Gastroenterology;  Laterality: N/A;   HOT HEMOSTASIS N/A 10/26/2019   Procedure: HOT HEMOSTASIS (ARGON PLASMA COAGULATION/BICAP);  Surgeon: Doran Stabler, MD;  Location: [facility];  Service: Gastroenterology;  Laterality: N/A;   POLYPECTOMY     POLYPECTOMY  09/23/2018   Procedure: POLYPECTOMY;  Surgeon: Doran Stabler, MD;  Location: [facility];  Service: Gastroenterology;;   POLYPECTOMY  10/26/2019   Procedure: POLYPECTOMY;  Surgeon: Doran Stabler, MD;  Location: [facility];  Service: Gastroenterology;;   PROSTATECTOMY     RETINAL DETACHMENT SURGERY     SUBMUCOSAL LIFTING INJECTION  09/23/2018   Procedure: SUBMUCOSAL LIFTING INJECTION;  Surgeon: Doran Stabler, MD;  Location: [facility];  Service: Gastroenterology;;   tripple bypass N/A 12/07/2015    Family History  Problem Relation Age of Onset   Colon cancer Mother    Emphysema Father    Colon cancer Brother    Stomach cancer Neg Hx    Pancreatic  cancer Neg Hx    Esophageal cancer Neg Hx    Rectal cancer Neg Hx    Colon polyps Neg Hx     Social History   Socioeconomic History   Marital status: Married    Spouse name: Hickman   Number of children: Not on file   Years of education: Not on file   Highest education level: Not on file  Occupational History   Not on file  Tobacco Use   Smoking status: Never   Smokeless tobacco: Never  Vaping Use   Vaping Use: Never used  Substance and Sexual Activity   Alcohol use: Yes    Comment: 2 cocktails per day    Drug use: No   Sexual activity: Not on file  Other Topics Concern   Not on file  Social  History Narrative   Not on file   Social Determinants of Health   Financial Resource Strain: Low Risk    Difficulty of Paying Living Expenses: Not hard at all  Food Insecurity: No Food Insecurity   Worried About Charity fundraiser in the Last Year: Never true   [place] in the Last Year: Never true  Transportation Needs: No Transportation Needs   Lack of Transportation (Medical): No   Lack of Transportation (Non-Medical): No  Physical Activity: Inactive   Days of Exercise per Week: 0 days   Minutes of Exercise per Session: 30 min  Stress: No Stress Concern Present   Feeling of Stress : Not at all  Social Connections: Socially Integrated   Frequency of Communication with Friends and Family: More than three times a week   Frequency of Social Gatherings with Friends and Family: More than three times a week   Attends Religious Services: More than 4 times per year   Active Member of [employer] or Organizations: Yes   Attends Archivist Meetings: 1 to 4 times per year   Marital Status: Married  Human resources officer Violence: Not At Risk   Fear of Current or Ex-Partner: No   Emotionally Abused: No   Physically Abused: No   Sexually Abused: No    Outpatient Medications Prior to Visit  Medication Sig Dispense Refill   aspirin EC 81 MG tablet Take 81 mg by mouth at bedtime.      carvedilol (COREG) 6.25 MG tablet Take 1 tablet (6.25 mg total) by mouth 2 (two) times daily. 180 tablet 3   fluticasone (FLONASE) 50 MCG/ACT nasal spray Place 2 sprays into both nostrils daily.     losartan (COZAAR) 25 MG tablet Take 25 mg by mouth daily.     Polyethyl Glycol-Propyl Glycol (LUBRICANT EYE DROPS) 0.4-0.3 % SOLN Place 1 drop into both eyes 3 (three) times daily as needed (dry/irritated eyes.).     rosuvastatin (CRESTOR) 20 MG tablet Take 20 mg by mouth every evening.      trospium (SANCTURA) 20 MG tablet Take 20 mg by mouth 2 (two) times daily.     vitamin E 400 UNIT capsule Take 800  Units by mouth daily.      trospium (SANCTURA) 20 MG tablet Take by mouth.     Facility-Administered Medications Prior to Visit  Medication Dose Route Frequency Provider Last Rate Last Admin   0.9 %  sodium chloride infusion  500 mL Intravenous Once Danis, Kirke Corin, MD        Allergies  Allergen Reactions   Penicillin G Hives    Did it involve swelling  of the face/tongue/throat, SOB, or low BP? Unknown Did it involve sudden or severe rash/hives, skin peeling, or any reaction on the inside of your mouth or nose? Unknown Did you need to seek medical attention at a hospital or doctor's office? No When did it last happen? Occurred when he was a teenager If all above answers are "NO", may proceed with cephalosporin use.     Atorvastatin Other (See Comments)    Causes muscle aches/cramps and tremors in fingers   Iodinated Diagnostic Agents Hives    Years ago, 1 small hive on neck   Omeprazole Rash    In high doses    Shellfish Allergy Nausea Only, Rash and Other (See Comments)    Depends on the type of shellfish    ROS Review of Systems  Constitutional:  Negative for chills, diaphoresis, fatigue, fever and unexpected weight change.  HENT: Negative.    Eyes:  Negative for photophobia and visual disturbance.  Respiratory: Negative.  Negative for chest tightness, shortness of breath and wheezing.   Cardiovascular: Negative.  Negative for chest pain.  Gastrointestinal: Negative.  Negative for nausea and vomiting.  Neurological:  Negative for dizziness, light-headedness and headaches.  Psychiatric/Behavioral: Negative.       Objective:    Physical Exam Vitals and nursing note reviewed.  Constitutional:      General: He is not in acute distress.    Appearance: Normal appearance. He is not ill-appearing, toxic-appearing or diaphoretic.  HENT:     Head: Normocephalic and atraumatic.     Right Ear: Tympanic membrane, ear canal and external ear normal.     Left Ear: Tympanic  membrane, ear canal and external ear normal.     Mouth/Throat:     Mouth: Mucous membranes are moist.     Pharynx: Oropharynx is clear. No oropharyngeal exudate or posterior oropharyngeal erythema.  Eyes:     General: No scleral icterus.       Right eye: No discharge.        Left eye: No discharge.     Extraocular Movements: Extraocular movements intact.     Conjunctiva/sclera: Conjunctivae normal.     Pupils: Pupils are equal, round, and reactive to light.  Neck:     Vascular: No carotid bruit.  Cardiovascular:     Rate and Rhythm: Normal rate and regular rhythm.  Pulmonary:     Effort: Pulmonary effort is normal.     Breath sounds: Normal breath sounds.  Musculoskeletal:     Cervical back: No rigidity or tenderness.     Right lower leg: No edema.     Left lower leg: No edema.  Lymphadenopathy:     Cervical: No cervical adenopathy.  Skin:    General: Skin is warm and dry.  Neurological:     Mental Status: He is alert and oriented to person, place, and time.    BP 130/82 (BP Location: Right Arm, Patient Position: Sitting, Cuff Size: Normal)   Pulse 77   Temp (!) 97.3 F (36.3 C) (Temporal)   Ht '5\' 11"'$  (1.803 m)   Wt 209 lb 6.4 oz (95 kg)   SpO2 97%   BMI 29.21 kg/m  Wt Readings from Last 3 Encounters:  09/20/20 209 lb 6.4 oz (95 kg)  04/05/20 216 lb 9.6 oz (98.2 kg)  12/24/19 217 lb 12.5 oz (98.8 kg)     Health Maintenance Due  Topic Date Due   TETANUS/TDAP  Never done   COVID-19 Vaccine (4 - Booster  for [place] series) 02/06/2020   INFLUENZA VACCINE  09/05/2020    There are no preventive care reminders to display for this patient.  Lab Results  Component Value Date   TSH 1.640 01/03/2015   Lab Results  Component Value Date   WBC 4.1 04/29/2017   HGB 13.9 04/29/2017   HCT 40.4 04/29/2017   MCV 102.9 (H) 04/29/2017   PLT 166.0 04/29/2017   Lab Results  Component Value Date   NA 137 11/24/2019   K 3.4 (L) 12/24/2019   CO2 27 11/24/2019   GLUCOSE 98  11/24/2019   BUN 21 11/24/2019   CREATININE 1.43 11/24/2019   BILITOT 0.6 08/01/2015   ALKPHOS 44 08/01/2015   AST 69 (H) 08/01/2015   ALT 95 (H) 08/01/2015   PROT 7.9 08/01/2015   ALBUMIN 4.3 08/01/2015   CALCIUM 9.0 11/24/2019   GFR 47.96 (L) 11/24/2019   Lab Results  Component Value Date   CHOL 226 (H) 08/01/2015   Lab Results  Component Value Date   HDL 42.00 08/01/2015   Lab Results  Component Value Date   LDLCALC 160 (H) 08/01/2015   Lab Results  Component Value Date   TRIG 116.0 08/01/2015   Lab Results  Component Value Date   CHOLHDL 5 08/01/2015   Lab Results  Component Value Date   HGBA1C 5.8 08/01/2015      Assessment & Plan:   Problem List Items Addressed This Visit       Other   Motion sickness   Relevant Medications   meclizine (ANTIVERT) 50 MG tablet   Other Visit Diagnoses     Shellfish allergy    -  Primary   Relevant Medications   EPINEPHrine 0.3 mg/0.3 mL IJ SOAJ injection       Meds ordered this encounter  Medications   meclizine (ANTIVERT) 50 MG tablet    Sig: Take 1 tablet (50 mg total) by mouth 3 (three) times daily as needed.    Dispense:  30 tablet    Refill:  0   EPINEPHrine 0.3 mg/0.3 mL IJ SOAJ injection    Sig: Inject 0.3 mg into the muscle as needed for anaphylaxis.    Dispense:  2 each    Refill:  1    Follow-up: Return if symptoms worsen or fail to improve, for Return for physical..   Discussed avoiding shellfish altogether.  Advised that if he is to use his EpiPen he should call 911 immediately.  We will use high-dose meclizine as needed for motion sickness.  Briefly discussed referral to allergist. Libby Maw, MD

## 2020-09-27 DIAGNOSIS — H903 Sensorineural hearing loss, bilateral: Secondary | ICD-10-CM | POA: Diagnosis not present

## 2020-10-18 DIAGNOSIS — C61 Malignant neoplasm of prostate: Secondary | ICD-10-CM | POA: Diagnosis not present

## 2020-10-18 DIAGNOSIS — N5231 Erectile dysfunction following radical prostatectomy: Secondary | ICD-10-CM | POA: Diagnosis not present

## 2020-10-18 DIAGNOSIS — N3040 Irradiation cystitis without hematuria: Secondary | ICD-10-CM | POA: Diagnosis not present

## 2020-10-28 DIAGNOSIS — E782 Mixed hyperlipidemia: Secondary | ICD-10-CM | POA: Diagnosis not present

## 2020-10-28 DIAGNOSIS — I2510 Atherosclerotic heart disease of native coronary artery without angina pectoris: Secondary | ICD-10-CM | POA: Diagnosis not present

## 2020-10-28 DIAGNOSIS — I10 Essential (primary) hypertension: Secondary | ICD-10-CM | POA: Diagnosis not present

## 2020-10-28 DIAGNOSIS — I350 Nonrheumatic aortic (valve) stenosis: Secondary | ICD-10-CM | POA: Diagnosis not present

## 2020-10-28 DIAGNOSIS — I712 Thoracic aortic aneurysm, without rupture: Secondary | ICD-10-CM | POA: Diagnosis not present

## 2020-10-28 DIAGNOSIS — R0609 Other forms of dyspnea: Secondary | ICD-10-CM | POA: Diagnosis not present

## 2020-10-28 DIAGNOSIS — R42 Dizziness and giddiness: Secondary | ICD-10-CM | POA: Diagnosis not present

## 2020-10-28 DIAGNOSIS — R0602 Shortness of breath: Secondary | ICD-10-CM | POA: Diagnosis not present

## 2020-10-28 DIAGNOSIS — R9431 Abnormal electrocardiogram [ECG] [EKG]: Secondary | ICD-10-CM | POA: Diagnosis not present

## 2020-11-01 DIAGNOSIS — L820 Inflamed seborrheic keratosis: Secondary | ICD-10-CM | POA: Diagnosis not present

## 2020-11-01 DIAGNOSIS — L821 Other seborrheic keratosis: Secondary | ICD-10-CM | POA: Diagnosis not present

## 2020-11-01 DIAGNOSIS — Z85828 Personal history of other malignant neoplasm of skin: Secondary | ICD-10-CM | POA: Diagnosis not present

## 2020-11-01 DIAGNOSIS — L718 Other rosacea: Secondary | ICD-10-CM | POA: Diagnosis not present

## 2020-11-01 DIAGNOSIS — L720 Epidermal cyst: Secondary | ICD-10-CM | POA: Diagnosis not present

## 2021-01-12 DIAGNOSIS — H33311 Horseshoe tear of retina without detachment, right eye: Secondary | ICD-10-CM | POA: Diagnosis not present

## 2021-01-12 DIAGNOSIS — H35373 Puckering of macula, bilateral: Secondary | ICD-10-CM | POA: Diagnosis not present

## 2021-01-12 DIAGNOSIS — H43811 Vitreous degeneration, right eye: Secondary | ICD-10-CM | POA: Diagnosis not present

## 2021-01-12 DIAGNOSIS — H33022 Retinal detachment with multiple breaks, left eye: Secondary | ICD-10-CM | POA: Diagnosis not present

## 2021-01-12 DIAGNOSIS — H04123 Dry eye syndrome of bilateral lacrimal glands: Secondary | ICD-10-CM | POA: Diagnosis not present

## 2021-01-12 DIAGNOSIS — H353131 Nonexudative age-related macular degeneration, bilateral, early dry stage: Secondary | ICD-10-CM | POA: Diagnosis not present

## 2021-02-09 DIAGNOSIS — E782 Mixed hyperlipidemia: Secondary | ICD-10-CM | POA: Diagnosis not present

## 2021-02-09 DIAGNOSIS — I10 Essential (primary) hypertension: Secondary | ICD-10-CM | POA: Diagnosis not present

## 2021-02-09 DIAGNOSIS — Z23 Encounter for immunization: Secondary | ICD-10-CM | POA: Diagnosis not present

## 2021-02-09 DIAGNOSIS — I2510 Atherosclerotic heart disease of native coronary artery without angina pectoris: Secondary | ICD-10-CM | POA: Diagnosis not present

## 2021-02-09 DIAGNOSIS — I7121 Aneurysm of the ascending aorta, without rupture: Secondary | ICD-10-CM | POA: Diagnosis not present

## 2021-02-14 DIAGNOSIS — E349 Endocrine disorder, unspecified: Secondary | ICD-10-CM | POA: Diagnosis not present

## 2021-04-11 ENCOUNTER — Ambulatory Visit: Payer: Medicare HMO

## 2021-04-11 DIAGNOSIS — C61 Malignant neoplasm of prostate: Secondary | ICD-10-CM | POA: Diagnosis not present

## 2021-04-28 DIAGNOSIS — I071 Rheumatic tricuspid insufficiency: Secondary | ICD-10-CM | POA: Diagnosis not present

## 2021-04-28 DIAGNOSIS — I10 Essential (primary) hypertension: Secondary | ICD-10-CM | POA: Diagnosis not present

## 2021-04-28 DIAGNOSIS — I7121 Aneurysm of the ascending aorta, without rupture: Secondary | ICD-10-CM | POA: Diagnosis not present

## 2021-04-28 DIAGNOSIS — E7800 Pure hypercholesterolemia, unspecified: Secondary | ICD-10-CM | POA: Diagnosis not present

## 2021-04-28 DIAGNOSIS — D61818 Other pancytopenia: Secondary | ICD-10-CM | POA: Diagnosis not present

## 2021-04-28 DIAGNOSIS — Z951 Presence of aortocoronary bypass graft: Secondary | ICD-10-CM | POA: Diagnosis not present

## 2021-04-28 DIAGNOSIS — I350 Nonrheumatic aortic (valve) stenosis: Secondary | ICD-10-CM | POA: Diagnosis not present

## 2021-04-28 DIAGNOSIS — I2510 Atherosclerotic heart disease of native coronary artery without angina pectoris: Secondary | ICD-10-CM | POA: Diagnosis not present

## 2021-04-28 DIAGNOSIS — Z79899 Other long term (current) drug therapy: Secondary | ICD-10-CM | POA: Diagnosis not present

## 2021-04-28 DIAGNOSIS — E876 Hypokalemia: Secondary | ICD-10-CM | POA: Diagnosis not present

## 2021-05-03 DIAGNOSIS — N5231 Erectile dysfunction following radical prostatectomy: Secondary | ICD-10-CM | POA: Diagnosis not present

## 2021-05-03 DIAGNOSIS — C61 Malignant neoplasm of prostate: Secondary | ICD-10-CM | POA: Diagnosis not present

## 2021-05-03 DIAGNOSIS — N3040 Irradiation cystitis without hematuria: Secondary | ICD-10-CM | POA: Diagnosis not present

## 2021-05-05 DIAGNOSIS — I10 Essential (primary) hypertension: Secondary | ICD-10-CM | POA: Diagnosis not present

## 2021-05-05 DIAGNOSIS — I2510 Atherosclerotic heart disease of native coronary artery without angina pectoris: Secondary | ICD-10-CM | POA: Diagnosis not present

## 2021-05-05 DIAGNOSIS — I9789 Other postprocedural complications and disorders of the circulatory system, not elsewhere classified: Secondary | ICD-10-CM | POA: Diagnosis not present

## 2021-05-05 DIAGNOSIS — I350 Nonrheumatic aortic (valve) stenosis: Secondary | ICD-10-CM | POA: Diagnosis not present

## 2021-05-05 DIAGNOSIS — E7800 Pure hypercholesterolemia, unspecified: Secondary | ICD-10-CM | POA: Diagnosis not present

## 2021-05-05 DIAGNOSIS — I4891 Unspecified atrial fibrillation: Secondary | ICD-10-CM | POA: Diagnosis not present

## 2021-05-05 DIAGNOSIS — I712 Thoracic aortic aneurysm, without rupture, unspecified: Secondary | ICD-10-CM | POA: Diagnosis not present

## 2021-05-05 DIAGNOSIS — E876 Hypokalemia: Secondary | ICD-10-CM | POA: Diagnosis not present

## 2021-05-17 DIAGNOSIS — N5231 Erectile dysfunction following radical prostatectomy: Secondary | ICD-10-CM | POA: Diagnosis not present

## 2021-05-18 DIAGNOSIS — H04123 Dry eye syndrome of bilateral lacrimal glands: Secondary | ICD-10-CM | POA: Diagnosis not present

## 2021-05-18 DIAGNOSIS — H353131 Nonexudative age-related macular degeneration, bilateral, early dry stage: Secondary | ICD-10-CM | POA: Diagnosis not present

## 2021-05-18 DIAGNOSIS — H33311 Horseshoe tear of retina without detachment, right eye: Secondary | ICD-10-CM | POA: Diagnosis not present

## 2021-05-18 DIAGNOSIS — H35373 Puckering of macula, bilateral: Secondary | ICD-10-CM | POA: Diagnosis not present

## 2021-05-18 DIAGNOSIS — H33022 Retinal detachment with multiple breaks, left eye: Secondary | ICD-10-CM | POA: Diagnosis not present

## 2021-05-18 DIAGNOSIS — H43811 Vitreous degeneration, right eye: Secondary | ICD-10-CM | POA: Diagnosis not present

## 2021-05-30 ENCOUNTER — Telehealth: Payer: Self-pay | Admitting: Family Medicine

## 2021-05-30 NOTE — Telephone Encounter (Signed)
Left message for patient to call back and schedule Medicare Annual Wellness Visit (AWV).   Please offer to do virtually or by telephone.  Left office number and my jabber #[phone].  Last AWV:04/05/2020  Please schedule at anytime with Nurse Health Advisor.   

## 2021-05-31 DIAGNOSIS — F5232 Male orgasmic disorder: Secondary | ICD-10-CM | POA: Diagnosis not present

## 2021-05-31 DIAGNOSIS — R69 Illness, unspecified: Secondary | ICD-10-CM | POA: Diagnosis not present

## 2021-06-16 ENCOUNTER — Telehealth: Payer: Self-pay | Admitting: Family Medicine

## 2021-06-16 NOTE — Telephone Encounter (Signed)
Left message for patient to call back and schedule Medicare Annual Wellness Visit (AWV).   Please offer to do virtually or by telephone.  Left office number and my jabber #[phone].  Last AWV:04/05/2020  Please schedule at anytime with Nurse Health Advisor.   

## 2021-06-19 DIAGNOSIS — Z0100 Encounter for examination of eyes and vision without abnormal findings: Secondary | ICD-10-CM | POA: Diagnosis not present

## 2021-06-26 DIAGNOSIS — D61818 Other pancytopenia: Secondary | ICD-10-CM | POA: Diagnosis not present

## 2021-06-26 DIAGNOSIS — D519 Vitamin B12 deficiency anemia, unspecified: Secondary | ICD-10-CM | POA: Diagnosis not present

## 2021-06-26 DIAGNOSIS — D709 Neutropenia, unspecified: Secondary | ICD-10-CM | POA: Diagnosis not present

## 2021-06-26 DIAGNOSIS — D6489 Other specified anemias: Secondary | ICD-10-CM | POA: Diagnosis not present

## 2021-06-26 DIAGNOSIS — R809 Proteinuria, unspecified: Secondary | ICD-10-CM | POA: Diagnosis not present

## 2021-06-29 DIAGNOSIS — H35373 Puckering of macula, bilateral: Secondary | ICD-10-CM | POA: Diagnosis not present

## 2021-06-29 DIAGNOSIS — H33311 Horseshoe tear of retina without detachment, right eye: Secondary | ICD-10-CM | POA: Diagnosis not present

## 2021-06-29 DIAGNOSIS — H43811 Vitreous degeneration, right eye: Secondary | ICD-10-CM | POA: Diagnosis not present

## 2021-06-29 DIAGNOSIS — H02831 Dermatochalasis of right upper eyelid: Secondary | ICD-10-CM | POA: Diagnosis not present

## 2021-06-29 DIAGNOSIS — H40003 Preglaucoma, unspecified, bilateral: Secondary | ICD-10-CM | POA: Diagnosis not present

## 2021-06-29 DIAGNOSIS — H33022 Retinal detachment with multiple breaks, left eye: Secondary | ICD-10-CM | POA: Diagnosis not present

## 2021-06-29 DIAGNOSIS — Z961 Presence of intraocular lens: Secondary | ICD-10-CM | POA: Diagnosis not present

## 2021-06-29 DIAGNOSIS — H16143 Punctate keratitis, bilateral: Secondary | ICD-10-CM | POA: Diagnosis not present

## 2021-06-29 DIAGNOSIS — H353131 Nonexudative age-related macular degeneration, bilateral, early dry stage: Secondary | ICD-10-CM | POA: Diagnosis not present

## 2021-06-29 DIAGNOSIS — H04123 Dry eye syndrome of bilateral lacrimal glands: Secondary | ICD-10-CM | POA: Diagnosis not present

## 2021-06-29 DIAGNOSIS — H35362 Drusen (degenerative) of macula, left eye: Secondary | ICD-10-CM | POA: Diagnosis not present

## 2021-06-29 DIAGNOSIS — H02834 Dermatochalasis of left upper eyelid: Secondary | ICD-10-CM | POA: Diagnosis not present

## 2021-06-30 ENCOUNTER — Encounter: Payer: Self-pay | Admitting: Family Medicine

## 2021-06-30 ENCOUNTER — Ambulatory Visit (INDEPENDENT_AMBULATORY_CARE_PROVIDER_SITE_OTHER): Payer: Medicare HMO | Admitting: Family Medicine

## 2021-06-30 VITALS — BP 108/68 | HR 76 | Temp 97.5°F | Ht 71.0 in | Wt 201.0 lb

## 2021-06-30 DIAGNOSIS — Z0000 Encounter for general adult medical examination without abnormal findings: Secondary | ICD-10-CM | POA: Diagnosis not present

## 2021-06-30 DIAGNOSIS — E538 Deficiency of other specified B group vitamins: Secondary | ICD-10-CM | POA: Insufficient documentation

## 2021-06-30 NOTE — Progress Notes (Signed)
Established Patient Office Visit  Subjective   Patient ID: Jeffrey Stein, male    DOB: 02-26-1945  Age: 76 y.o. MRN: [id number]  Chief Complaint  Patient presents with   Annual Exam    CPE, no concerns. Patient fasting.     HPI for physical exam.  Mostly doing okay.  There is some fatigue.  He is seeing the dentist regularly.  He is restarting an exercise routine.  He is followed by [facility] cardiology for his history of CAD.  Blood pressure and cholesterol are well controlled.  He is followed by [facility] urology with a history of prostatectomy and follow-up radiation.  Recently under work-up by hematology at [facility].  Review of that lab work shows that they are considering a diagnosis of multiple myeloma.  He is aware of this.  B12 is low.  He is taking B12 supplement.  He was also given a copper supplement to take.    Review of Systems  Constitutional:  Positive for malaise/fatigue.  HENT: Negative.    Eyes:  Negative for blurred vision, discharge and redness.  Respiratory: Negative.    Cardiovascular: Negative.   Gastrointestinal:  Negative for abdominal pain.  Genitourinary: Negative.   Musculoskeletal:  Positive for back pain. Negative for myalgias.  Skin:  Negative for rash.  Neurological:  Negative for tingling, loss of consciousness and weakness.  Endo/Heme/Allergies:  Negative for polydipsia.     Objective:     BP 108/68 (BP Location: Right Arm, Patient Position: Sitting, Cuff Size: Large)   Pulse 76   Temp (!) 97.5 F (36.4 C) (Temporal)   Ht 5' 11"  (1.803 m)   Wt 201 lb (91.2 kg)   SpO2 96%   BMI 28.03 kg/m  Wt Readings from Last 3 Encounters:  06/30/21 201 lb (91.2 kg)  09/20/20 209 lb 6.4 oz (95 kg)  04/05/20 216 lb 9.6 oz (98.2 kg)      Physical Exam Constitutional:      General: He is not in acute distress.    Appearance: Normal appearance. He is not ill-appearing, toxic-appearing or diaphoretic.  HENT:     Head: Normocephalic and atraumatic.     Right  Ear: External ear normal.     Left Ear: External ear normal.     Mouth/Throat:     Mouth: Mucous membranes are moist.     Pharynx: Oropharynx is clear. No oropharyngeal exudate or posterior oropharyngeal erythema.  Eyes:     General: No scleral icterus.       Right eye: No discharge.        Left eye: No discharge.     Extraocular Movements: Extraocular movements intact.     Conjunctiva/sclera: Conjunctivae normal.     Pupils: Pupils are equal, round, and reactive to light.  Cardiovascular:     Rate and Rhythm: Normal rate and regular rhythm.  Pulmonary:     Effort: Pulmonary effort is normal. No respiratory distress.     Breath sounds: Normal breath sounds.  Abdominal:     General: Bowel sounds are normal.     Tenderness: There is no abdominal tenderness. There is no guarding.  Musculoskeletal:     Cervical back: No rigidity or tenderness.  Skin:    General: Skin is warm and dry.  Neurological:     Mental Status: He is alert and oriented to person, place, and time.  Psychiatric:        Mood and Affect: Mood normal.  Behavior: Behavior normal.     No results found for any visits on 06/30/21.    The ASCVD Risk score (Arnett DK, et al., 2019) failed to calculate for the following reasons:   The valid total cholesterol range is 130 to 320 mg/dL    Assessment & Plan:   Problem List Items Addressed This Visit       Other   Healthcare maintenance - Primary    Return in about 6 months (around 12/31/2021).  Continue exercise as tolerated.  Continue follow-up with [facility] hematology scheduled for August.  Continue all medicines as above.  Information given on health maintenance and disease prevention.  Libby Maw, MD

## 2021-07-07 DIAGNOSIS — Z0100 Encounter for examination of eyes and vision without abnormal findings: Secondary | ICD-10-CM | POA: Diagnosis not present

## 2021-07-27 ENCOUNTER — Telehealth: Payer: Self-pay | Admitting: Family Medicine

## 2021-07-27 NOTE — Telephone Encounter (Signed)
Left message for patient to call back and schedule Medicare Annual Wellness Visit (AWV).   Please offer to do virtually or by telephone.  Left office number and my jabber #[phone].  Last AWV:04/05/2020  Please schedule at anytime with Nurse Health Advisor.   

## 2021-08-11 ENCOUNTER — Telehealth: Payer: Self-pay | Admitting: Family Medicine

## 2021-08-11 NOTE — Telephone Encounter (Signed)
Left message for patient to call back and schedule Medicare Annual Wellness Visit (AWV).   Please offer to do virtually or by telephone.  Left office number and my jabber #[phone].  Last AWV:04/05/2020  Please schedule at anytime with Nurse Health Advisor.   

## 2021-08-31 DIAGNOSIS — Z8669 Personal history of other diseases of the nervous system and sense organs: Secondary | ICD-10-CM | POA: Diagnosis not present

## 2021-08-31 DIAGNOSIS — Q142 Congenital malformation of optic disc: Secondary | ICD-10-CM | POA: Diagnosis not present

## 2021-08-31 DIAGNOSIS — H40023 Open angle with borderline findings, high risk, bilateral: Secondary | ICD-10-CM | POA: Diagnosis not present

## 2021-08-31 DIAGNOSIS — Z961 Presence of intraocular lens: Secondary | ICD-10-CM | POA: Diagnosis not present

## 2021-09-05 DIAGNOSIS — I4891 Unspecified atrial fibrillation: Secondary | ICD-10-CM | POA: Diagnosis not present

## 2021-09-05 DIAGNOSIS — I2510 Atherosclerotic heart disease of native coronary artery without angina pectoris: Secondary | ICD-10-CM | POA: Diagnosis not present

## 2021-09-05 DIAGNOSIS — E7800 Pure hypercholesterolemia, unspecified: Secondary | ICD-10-CM | POA: Diagnosis not present

## 2021-09-05 DIAGNOSIS — I9789 Other postprocedural complications and disorders of the circulatory system, not elsewhere classified: Secondary | ICD-10-CM | POA: Diagnosis not present

## 2021-09-05 DIAGNOSIS — I10 Essential (primary) hypertension: Secondary | ICD-10-CM | POA: Diagnosis not present

## 2021-09-05 DIAGNOSIS — I350 Nonrheumatic aortic (valve) stenosis: Secondary | ICD-10-CM | POA: Diagnosis not present

## 2021-09-14 ENCOUNTER — Telehealth: Payer: Self-pay | Admitting: Family Medicine

## 2021-09-14 NOTE — Telephone Encounter (Signed)
Left message for patient to call back and schedule Medicare Annual Wellness Visit (AWV).   Please offer to do virtually or by telephone.  Left office number and my jabber #[phone].  Last AWV:04/05/2020  Please schedule at anytime with Nurse Health Advisor.   

## 2021-09-28 DIAGNOSIS — R809 Proteinuria, unspecified: Secondary | ICD-10-CM | POA: Diagnosis not present

## 2021-09-28 DIAGNOSIS — E610 Copper deficiency: Secondary | ICD-10-CM | POA: Diagnosis not present

## 2021-09-28 DIAGNOSIS — E538 Deficiency of other specified B group vitamins: Secondary | ICD-10-CM | POA: Diagnosis not present

## 2021-09-28 DIAGNOSIS — D61818 Other pancytopenia: Secondary | ICD-10-CM | POA: Diagnosis not present

## 2021-10-11 ENCOUNTER — Telehealth: Payer: Self-pay | Admitting: Family Medicine

## 2021-10-11 NOTE — Telephone Encounter (Signed)
Left message for patient to call back and schedule Medicare Annual Wellness Visit (AWV).   Please offer to do virtually or by telephone.  Left office number and my jabber #[phone].  Last AWV:04/05/2020  Please schedule at anytime with Nurse Health Advisor.   

## 2021-10-12 DIAGNOSIS — H35373 Puckering of macula, bilateral: Secondary | ICD-10-CM | POA: Diagnosis not present

## 2021-10-12 DIAGNOSIS — Z8669 Personal history of other diseases of the nervous system and sense organs: Secondary | ICD-10-CM | POA: Diagnosis not present

## 2021-10-12 DIAGNOSIS — H33022 Retinal detachment with multiple breaks, left eye: Secondary | ICD-10-CM | POA: Diagnosis not present

## 2021-10-12 DIAGNOSIS — H33311 Horseshoe tear of retina without detachment, right eye: Secondary | ICD-10-CM | POA: Diagnosis not present

## 2021-10-12 DIAGNOSIS — H353131 Nonexudative age-related macular degeneration, bilateral, early dry stage: Secondary | ICD-10-CM | POA: Diagnosis not present

## 2021-10-12 DIAGNOSIS — Q142 Congenital malformation of optic disc: Secondary | ICD-10-CM | POA: Diagnosis not present

## 2021-10-12 DIAGNOSIS — Z961 Presence of intraocular lens: Secondary | ICD-10-CM | POA: Diagnosis not present

## 2021-10-17 DIAGNOSIS — C61 Malignant neoplasm of prostate: Secondary | ICD-10-CM | POA: Diagnosis not present

## 2021-10-17 DIAGNOSIS — N3040 Irradiation cystitis without hematuria: Secondary | ICD-10-CM | POA: Diagnosis not present

## 2021-10-25 ENCOUNTER — Encounter: Payer: Self-pay | Admitting: Gastroenterology

## 2021-10-26 ENCOUNTER — Ambulatory Visit (INDEPENDENT_AMBULATORY_CARE_PROVIDER_SITE_OTHER): Payer: Medicare HMO

## 2021-10-26 DIAGNOSIS — Z23 Encounter for immunization: Secondary | ICD-10-CM

## 2021-10-26 NOTE — Progress Notes (Signed)
After obtaining consent, and per orders of Dr. Ethelene Hal, injection of influenza given by Lynda Rainwater. Patient instructed to remain in clinic for 20 minutes afterwards, and to report any adverse reaction to me immediately.

## 2021-11-16 ENCOUNTER — Telehealth: Payer: Self-pay

## 2021-11-16 NOTE — Patient Outreach (Signed)
  Care Coordination   11/16/2021 Name: Jeffrey Stein MRN: [id number] DOB: December 13, 1945   Care Coordination Outreach Attempts:  An unsuccessful telephone outreach was attempted today to offer the patient information about available care coordination services as a benefit of their health plan.   Follow Up Plan:  Additional outreach attempts will be made to offer the patient care coordination information and services.   Encounter Outcome:  No Answer  Care Coordination Interventions Activated:  No   Care Coordination Interventions:  No, not indicated    Jone Baseman, RN, MSN [facility] Care Management Care Management Coordinator Direct Line [phone]

## 2021-11-20 ENCOUNTER — Encounter: Payer: Self-pay | Admitting: Family Medicine

## 2021-11-30 ENCOUNTER — Telehealth: Payer: Self-pay | Admitting: Family Medicine

## 2021-11-30 DIAGNOSIS — C44319 Basal cell carcinoma of skin of other parts of face: Secondary | ICD-10-CM | POA: Diagnosis not present

## 2021-11-30 DIAGNOSIS — Z85828 Personal history of other malignant neoplasm of skin: Secondary | ICD-10-CM | POA: Diagnosis not present

## 2021-11-30 DIAGNOSIS — L821 Other seborrheic keratosis: Secondary | ICD-10-CM | POA: Diagnosis not present

## 2021-11-30 DIAGNOSIS — D485 Neoplasm of uncertain behavior of skin: Secondary | ICD-10-CM | POA: Diagnosis not present

## 2021-11-30 DIAGNOSIS — C4441 Basal cell carcinoma of skin of scalp and neck: Secondary | ICD-10-CM | POA: Diagnosis not present

## 2021-11-30 NOTE — Telephone Encounter (Signed)
Left message for patient to call back and schedule Medicare Annual Wellness Visit (AWV).   Please offer to do virtually or by telephone.  Left office number and my jabber [phone].  Last AWV:04/05/2020  Please schedule at anytime with Nurse Health Advisor.

## 2021-12-01 ENCOUNTER — Telehealth: Payer: Self-pay

## 2021-12-01 NOTE — Patient Outreach (Signed)
  Care Coordination   12/01/2021 Name: Jeffrey Stein MRN: [id number] DOB: [birth year]   Care Coordination Outreach Attempts:  A second unsuccessful outreach was attempted today to offer the patient with information about available care coordination services as a benefit of their health plan.     Follow Up Plan:  Additional outreach attempts will be made to offer the patient care coordination information and services.   Encounter Outcome:  No Answer  Care Coordination Interventions Activated:  No   Care Coordination Interventions:  No, not indicated     Jone Baseman, RN, MSN [facility] Care Management Care Management Coordinator Direct Line [phone]

## 2021-12-08 DIAGNOSIS — I10 Essential (primary) hypertension: Secondary | ICD-10-CM | POA: Diagnosis not present

## 2021-12-08 DIAGNOSIS — I7121 Aneurysm of the ascending aorta, without rupture: Secondary | ICD-10-CM | POA: Diagnosis not present

## 2021-12-08 DIAGNOSIS — E7800 Pure hypercholesterolemia, unspecified: Secondary | ICD-10-CM | POA: Diagnosis not present

## 2021-12-08 DIAGNOSIS — I350 Nonrheumatic aortic (valve) stenosis: Secondary | ICD-10-CM | POA: Diagnosis not present

## 2021-12-08 DIAGNOSIS — I2510 Atherosclerotic heart disease of native coronary artery without angina pectoris: Secondary | ICD-10-CM | POA: Diagnosis not present

## 2021-12-19 ENCOUNTER — Encounter: Payer: Self-pay | Admitting: Gastroenterology

## 2021-12-19 ENCOUNTER — Ambulatory Visit: Payer: Medicare HMO | Admitting: Gastroenterology

## 2021-12-19 VITALS — BP 114/66 | HR 75 | Ht 71.0 in | Wt 208.0 lb

## 2021-12-19 DIAGNOSIS — Z86010 Personal history of colonic polyps: Secondary | ICD-10-CM

## 2021-12-19 DIAGNOSIS — K627 Radiation proctitis: Secondary | ICD-10-CM | POA: Diagnosis not present

## 2021-12-19 MED ORDER — NA SULFATE-K SULFATE-MG SULF 17.5-3.13-1.6 GM/177ML PO SOLN: 1.0000 | Freq: Once | ORAL | 0 refills | Status: AC

## 2021-12-19 NOTE — Progress Notes (Signed)
[facility] Consult Note:  History: Jeffrey Stein 12/19/2021  Referring provider: Libby Maw, MD  Reason for consult/chief complaint: Colon Polyps (Patient is here for recall colon for hx of colon polyps. Patient denies blood thinners or any GI sx.)   Subjective  HPI:  Jeffrey Stein was here to discuss surveillance colonoscopy. Colonoscopy August 2020 for EMR of cecal polyp and removal of additional right colon polyp.  Left-sided diverticulosis and radiation proctitis also noted. Colonoscopy September 2021 removed 6 mm tubular adenoma at prior cecal polypectomy site as well as 2 additional small adenomas and the proctitis was treated with APC because the patient reported intermittent bleeding.  He tells me the bleeding happens rarely, and usually if he is somewhat constipated.  Bowel habits much improved taking MiraLAX 3-4 times a week.  Denies nausea vomiting or abdominal pain.  ROS:  Review of Systems Denies chest pain or dyspnea Seasonal allergies Arthralgias Remainder systems negative except as above  Past Medical History: Past Medical History:  Diagnosis Date   Abnormal liver enzymes    Allergic rhinitis    Allergy    seasonal and environmental   CAD (coronary artery disease)    Cataract    Colon polyps    Diverticulosis    ED (erectile dysfunction)    GERD (gastroesophageal reflux disease)    Heart murmur    20 yrs ago   Hemochromatosis    HTN (hypertension)    Hyperlipemia    Meniere's disease    Prostate cancer ([place])    Retinal detachment    Excerpt from his cardiologist's office note of 12/08/2021: "1. Coronary artery disease involving native coronary artery of native heart without angina pectoris continues to do well we will continue with risk factor modification with lipid-lowering therapy and blood pressure control. He is on aspirin  2. Pure hypercholesterolemia: LDL is in the 40s on 20 of rosuvastatin  3. Essential  hypertension I spent some time with him talking about his blood pressure. I would like him to have better blood pressure control. He is going to monitor it for a couple weeks. He says he was following on higher doses of losartan.  4. Nonrheumatic aortic valve stenosis: My preliminary evaluation of his echo today shows no progression he had modest progression of his disease compared with 2018 and is still in the very low moderate range  5. Aneurysm of ascending aorta without rupture (CMS-HCC) ascending aorta is 3.9 by echo in April "  Past Surgical History: Past Surgical History:  Procedure Laterality Date   APPENDECTOMY     BASAL CELL CARCINOMA EXCISION  2014   behind right knee and back   cataracts      COLONOSCOPY  2020   COLONOSCOPY WITH PROPOFOL N/A 09/23/2018   Procedure: COLONOSCOPY WITH PROPOFOL;  Surgeon: Doran Stabler, MD;  Location: [facility];  Service: Gastroenterology;  Laterality: N/A;   COLONOSCOPY WITH PROPOFOL N/A 10/26/2019   Procedure: COLONOSCOPY WITH PROPOFOL;  Surgeon: Doran Stabler, MD;  Location: [facility] END[facility];  Service: Gastroenterology;  Laterality: N/A;   HEMOSTASIS CLIP PLACEMENT  09/23/2018   Procedure: HEMOSTASIS CLIP PLACEMENT;  Surgeon: Doran Stabler, MD;  Location: [facility];  Service: Gastroenterology;;   HOT HEMOSTASIS N/A 09/23/2018   Procedure: HOT HEMOSTASIS (ARGON PLASMA COAGULATION/BICAP);  Surgeon: Doran Stabler, MD;  Location: [facility];  Service: Gastroenterology;  Laterality: N/A;   HOT HEMOSTASIS N/A 10/26/2019   Procedure: HOT HEMOSTASIS (ARGON PLASMA COAGULATION/BICAP);  Surgeon: Doran Stabler, MD;  Location: [facility];  Service: Gastroenterology;  Laterality: N/A;   POLYPECTOMY     POLYPECTOMY  09/23/2018   Procedure: POLYPECTOMY;  Surgeon: Doran Stabler, MD;  Location: [facility];  Service: Gastroenterology;;   POLYPECTOMY  10/26/2019   Procedure: POLYPECTOMY;  Surgeon: Doran Stabler, MD;  Location: [facility]OS[facility];  Service: Gastroenterology;;   PROSTATECTOMY     RETINAL DETACHMENT SURGERY     SUBMUCOSAL LIFTING INJECTION  09/23/2018   Procedure: SUBMUCOSAL LIFTING INJECTION;  Surgeon: Doran Stabler, MD;  Location: [facility];  Service: Gastroenterology;;   tripple bypass N/A 12/07/2015     Family History: Family History  Problem Relation Age of Onset   Colon cancer Mother    Emphysema Father    Colon cancer Brother    Stomach cancer Neg Hx    Pancreatic cancer Neg Hx    Esophageal cancer Neg Hx    Rectal cancer Neg Hx    Colon polyps Neg Hx     Social History: Social History   Socioeconomic History   Marital status: Married    Spouse name: L[place]wee   Number of children: Not on file   Years of education: Not on file   Highest education level: Not on file  Occupational History   Not on file  Tobacco Use   Smoking status: Never   Smokeless tobacco: Never  Vaping Use   Vaping Use: Never used  Substance and Sexual Activity   Alcohol use: Yes    Comment: 2 cocktails per day    Drug use: No   Sexual activity: Not on file  Other Topics Concern   Not on file  Social History Narrative   Not on file   Social Determinants of Health   Financial Resource Strain: Low Risk  (04/05/2020)   Overall Financial Resource Strain (CARDIA)    Difficulty of Paying Living Expenses: Not hard at all  Food Insecurity: No Food Insecurity (04/05/2020)   Hunger Vital Sign    Worried About Running Out of Food in the Last Year: Never true    [place] in the Last Year: Never true  Transportation Needs: No Transportation Needs (04/05/2020)   PRAPARE - Hydrologist (Medical): No    Lack of Transportation (Non-Medical): No  Physical Activity: Inactive (04/05/2020)   Exercise Vital Sign    Days of Exercise per Week: 0 days    Minutes of Exercise per Session: 30 min  Stress: No Stress Concern Present (04/05/2020)   [place]    Feeling of Stress : Not at all  Social Connections: Start (04/05/2020)   Social Connection and Isolation Panel [NHANES]    Frequency of Communication with Friends and Family: More than three times a week    Frequency of Social Gatherings with Friends and Family: More than three times a week    Attends Religious Services: More than 4 times per year    Active Member of [employer] or Organizations: Yes    Attends Archivist Meetings: 1 to 4 times per year    Marital Status: Married    Allergies: Allergies  Allergen Reactions   Penicillin G Hives    Did it involve swelling of the face/tongue/throat, SOB, or low BP? Unknown Did it involve sudden or severe rash/hives, skin peeling, or any reaction on the inside of your mouth  or nose? Unknown Did you need to seek medical attention at a hospital or doctor's office? No When did it last happen? Occurred when he was a teenager If all above answers are "NO", may proceed with cephalosporin use.     Atorvastatin Other (See Comments)    Causes muscle aches/cramps and tremors in fingers   Iodinated Contrast Media Hives    Years ago, 1 small hive on neck   Omeprazole Rash    In high doses    Shellfish Allergy Nausea Only, Rash and Other (See Comments)    Depends on the type of shellfish    Outpatient Meds: Current Outpatient Medications  Medication Sig Dispense Refill   aspirin EC 81 MG tablet Take 81 mg by mouth at bedtime.      carvedilol (COREG) 6.25 MG tablet Take 1 tablet (6.25 mg total) by mouth 2 (two) times daily. 180 tablet 3   Copper Gluconate 2 MG CAPS Take 1 capsule by mouth daily.     cyanocobalamin 1000 MCG tablet Take by mouth.     fluticasone (FLONASE) 50 MCG/ACT nasal spray Place 2 sprays into both nostrils daily.     losartan (COZAAR) 25 MG tablet Take 25 mg by mouth daily.     Multiple Vitamins-Minerals (SYSTANE ICAPS AREDS2) TABS Take by mouth.     Na Sulfate-K  Sulfate-Mg Sulf 17.5-3.13-1.6 GM/177ML SOLN Take 1 kit by mouth once for 1 dose. 354 mL 0   Polyethyl Glycol-Propyl Glycol (LUBRICANT EYE DROPS) 0.4-0.3 % SOLN Place 1 drop into both eyes 3 (three) times daily as needed (dry/irritated eyes.).     potassium chloride SA (KLOR-CON M) 20 MEQ tablet Take by mouth.     rosuvastatin (CRESTOR) 20 MG tablet Take 20 mg by mouth every evening.      trospium (SANCTURA) 20 MG tablet Take 20 mg by mouth 2 (two) times daily.     vitamin E 400 UNIT capsule Take 800 Units by mouth daily.      Current Facility-Administered Medications  Medication Dose Route Frequency Provider Last Rate Last Admin   0.9 %  sodium chloride infusion  500 mL Intravenous Once Doran Stabler, MD          ___________________________________________________________________ Objective   Exam:  BP 114/66   Pulse 75   Ht _0  (1.803 m)   Wt 208 lb (94.3 kg)   BMI 29.01 kg/m  Wt Readings from Last 3 Encounters:  12/19/21 208 lb (94.3 kg)  06/30/21 201 lb (91.2 kg)  09/20/20 209 lb 6.4 oz (95 kg)    General: Ambulatory, very well-appearing Eyes: sclera anicteric, no redness ENT: oral mucosa moist without lesions, no cervical or supraclavicular lymphadenopathy CV: Regular with soft systolic murmur, no JVD, no peripheral edema Resp: clear to auscultation bilaterally, normal RR and effort noted GI: soft, no tenderness, with active bowel sounds. No guarding or palpable organomegaly noted. Skin; warm and dry, no rash or jaundice noted Neuro: awake, alert and oriented x 3. Normal gross motor function and fluent speech   Assessment: Encounter Diagnoses  Name Primary?   Hx of colonic polyps Yes   Radiation proctitis     Minimal bleeding at this point, so he is unlikely to need repeat APC treatment. Colonoscopy due for polyp surveillance, and we will plan to do it in our office based endoscopy lab, as I think it is unlikely he will have advanced polyp requiring EMR at  this point.  Plan:  Colonoscopy scheduled.  He was agreeable after discussion of procedure and risks.  The benefits and risks of the planned procedure were described in detail with the patient or (when appropriate) their health care proxy.  Risks were outlined as including, but not limited to, bleeding, infection, perforation, adverse medication reaction leading to cardiac or pulmonary decompensation, pancreatitis (if ERCP).  The limitation of incomplete mucosal visualization was also discussed.  No guarantees or warranties were given.   Thank you for the courtesy of this consult.  Please call me with any questions or concerns.  [name]  CC: Referring provider noted above

## 2021-12-19 NOTE — Patient Instructions (Signed)
_______________________________________________________  If you are age 76 or older, your body mass index should be between 23-30. Your Body mass index is 29.01 kg/m. If this is out of the aforementioned range listed, please consider follow up with your Primary Care Provider.  If you are age 70 or younger, your body mass index should be between 19-25. Your Body mass index is 29.01 kg/m. If this is out of the aformentioned range listed, please consider follow up with your Primary Care Provider.   ________________________________________________________  The [facility] providers would like to encourage you to use [facility] to communicate with providers for non-urgent requests or questions.  Due to long hold times on the telephone, sending your provider a message by [facility] may be a faster and more efficient way to get a response.  Please allow 48 business hours for a response.  Please remember that this is for non-urgent requests.  _______________________________________________________  Dennis Bast have been scheduled for a colonoscopy. Please follow written instructions given to you at your visit today.  Please pick up your prep supplies at the pharmacy within the next 1-3 days. If you use inhalers (even only as needed), please bring them with you on the day of your procedure.  Due to recent changes in healthcare laws, you may see the results of your imaging and laboratory studies on MyChart before your provider has had a chance to review them.  We understand that in some cases there may be results that are confusing or concerning to you. Not all laboratory results come back in the same time frame and the provider may be waiting for multiple results in order to interpret others.  Please give [place] 48 hours in order for your provider to thoroughly review all the results before contacting the office for clarification of your results.   It was a pleasure to see you today!  Thank you for trusting me with your  gastrointestinal care!

## 2021-12-20 ENCOUNTER — Telehealth: Payer: Self-pay

## 2021-12-20 NOTE — Patient Outreach (Signed)
  Care Coordination   12/20/2021 Name: Jeffrey Stein MRN: [id number] DOB: 02-21-1945   Care Coordination Outreach Attempts:  A third unsuccessful outreach was attempted today to offer the patient with information about available care coordination services as a benefit of their health plan.   Follow Up Plan:  No further outreach attempts will be made at this time. We have been unable to contact the patient to offer or enroll patient in care coordination services  Encounter Outcome:  No Answer  Care Coordination Interventions Activated:  No   Care Coordination Interventions:  No, not indicated    Jone Baseman, RN, MSN [facility] Care Management Coordinator Direct Line [phone]

## 2022-01-01 ENCOUNTER — Ambulatory Visit: Payer: Medicare HMO | Admitting: Family Medicine

## 2022-01-17 DIAGNOSIS — C44212 Basal cell carcinoma of skin of right ear and external auricular canal: Secondary | ICD-10-CM | POA: Diagnosis not present

## 2022-02-16 ENCOUNTER — Encounter: Payer: Self-pay | Admitting: Family Medicine

## 2022-02-16 ENCOUNTER — Ambulatory Visit (INDEPENDENT_AMBULATORY_CARE_PROVIDER_SITE_OTHER): Payer: Medicare HMO | Admitting: Family Medicine

## 2022-02-16 VITALS — BP 118/66 | HR 64 | Temp 97.1°F | Ht 71.0 in | Wt 204.8 lb

## 2022-02-16 DIAGNOSIS — E7800 Pure hypercholesterolemia, unspecified: Secondary | ICD-10-CM | POA: Diagnosis not present

## 2022-02-16 DIAGNOSIS — E538 Deficiency of other specified B group vitamins: Secondary | ICD-10-CM | POA: Diagnosis not present

## 2022-02-16 DIAGNOSIS — N3040 Irradiation cystitis without hematuria: Secondary | ICD-10-CM | POA: Diagnosis not present

## 2022-02-16 LAB — CBC
HCT: 42.4 % (ref 39.0–52.0)
Hemoglobin: 14.2 g/dL (ref 13.0–17.0)
MCHC: 33.4 g/dL (ref 30.0–36.0)
MCV: 97 fl (ref 78.0–100.0)
Platelets: 194 10*3/uL (ref 150.0–400.0)
RBC: 4.37 Mil/uL (ref 4.22–5.81)
RDW: 14.5 % (ref 11.5–15.5)
WBC: 4.6 10*3/uL (ref 4.0–10.5)

## 2022-02-16 LAB — COMPREHENSIVE METABOLIC PANEL
ALT: 42 U/L (ref 0–53)
AST: 61 U/L — ABNORMAL HIGH (ref 0–37)
Albumin: 3.9 g/dL (ref 3.5–5.2)
Alkaline Phosphatase: 47 U/L (ref 39–117)
BUN: 15 mg/dL (ref 6–23)
CO2: 21 mEq/L (ref 19–32)
Calcium: 9.1 mg/dL (ref 8.4–10.5)
Chloride: 106 mEq/L (ref 96–112)
Creatinine, Ser: 1.18 mg/dL (ref 0.40–1.50)
GFR: 60.13 mL/min (ref >60.00)
Glucose, Bld: 113 mg/dL — ABNORMAL HIGH (ref 70–99)
Potassium: 4 mEq/L (ref 3.5–5.1)
Sodium: 141 mEq/L (ref 135–145)
Total Bilirubin: 0.4 mg/dL (ref 0.2–1.2)
Total Protein: 7.1 g/dL (ref 6.0–8.3)

## 2022-02-16 LAB — LIPID PANEL
Cholesterol: 111 mg/dL (ref 0–200)
HDL: 38.4 mg/dL — ABNORMAL LOW (ref >39.00)
LDL Cholesterol: 54 mg/dL (ref 0–99)
NonHDL: 72.51
Total CHOL/HDL Ratio: 3
Triglycerides: 91 mg/dL (ref 0.0–149.0)
VLDL: 18.2 mg/dL (ref 0.0–40.0)

## 2022-02-16 LAB — URINALYSIS, ROUTINE W REFLEX MICROSCOPIC
Bilirubin Urine: NEGATIVE
Hgb urine dipstick: NEGATIVE
Ketones, ur: NEGATIVE
Leukocytes,Ua: NEGATIVE
Nitrite: NEGATIVE
Specific Gravity, Urine: >=1.03 — AB (ref 1.000–1.030)
Total Protein, Urine: 30 — AB
Urine Glucose: NEGATIVE
Urobilinogen, UA: 0.2 (ref 0.0–1.0)
pH: 5.5 (ref 5.0–8.0)

## 2022-02-16 LAB — VITAMIN B12: Vitamin B-12: 1129 pg/mL — ABNORMAL HIGH (ref 211–911)

## 2022-02-16 NOTE — Progress Notes (Signed)
Established Patient Office Visit   Subjective:  Patient ID: Jeffrey Stein, male    DOB: 1945-05-13  Age: 77 y.o. MRN: [id number]  Chief Complaint  Patient presents with   Follow-up    6 month f/u.  No concerns.  Fasting today.     HPI Encounter Diagnoses  Name Primary?   Radiation cystitis Yes   Elevated LDL cholesterol level    B12 deficiency    For follow-up of elevated cholesterol.  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  PSA has been close to 0.  It is expected that he will have intermittent hematuria.  Follow-up required's for sustained gross hematuria.  History of coronary artery disease with elevated LDL cholesterol.  Tolerating rosuvastatin 20 mg daily.  Continues with 1000 mcg of cyanocobalamin daily.   Review of Systems  Constitutional: Negative.   HENT: Negative.    Eyes:  Negative for blurred vision, discharge and redness.  Respiratory: Negative.    Cardiovascular: Negative.   Gastrointestinal:  Negative for abdominal pain.  Genitourinary: Negative.   Musculoskeletal: Negative.  Negative for myalgias.  Skin:  Negative for rash.  Neurological:  Negative for tingling, loss of consciousness and weakness.  Endo/Heme/Allergies:  Negative for polydipsia.     Current Outpatient Medications:    aspirin EC 81 MG tablet, Take 81 mg by mouth at bedtime. , Disp: , Rfl:    carvedilol (COREG) 6.25 MG tablet, Take 1 tablet (6.25 mg total) by mouth 2 (two) times daily., Disp: 180 tablet, Rfl: 3   Copper Gluconate 2 MG CAPS, Take 1 capsule by mouth daily., Disp: , Rfl:    cyanocobalamin 1000 MCG tablet, Take by mouth., Disp: , Rfl:    fluticasone (FLONASE) 50 MCG/ACT nasal spray, Place 2 sprays into both nostrils daily., Disp: , Rfl:    losartan (COZAAR) 25 MG tablet, Take 25 mg by mouth daily., Disp: , Rfl:    Multiple Vitamins-Minerals (SYSTANE ICAPS AREDS2) TABS, Take by mouth., Disp: , Rfl:    Polyethyl  Glycol-Propyl Glycol (LUBRICANT EYE DROPS) 0.4-0.3 % SOLN, Place 1 drop into both eyes 3 (three) times daily as needed (dry/irritated eyes.)., Disp: , Rfl:    potassium chloride SA (KLOR-CON M) 20 MEQ tablet, Take by mouth., Disp: , Rfl:    rosuvastatin (CRESTOR) 20 MG tablet, Take 20 mg by mouth every evening. , Disp: , Rfl:    trospium (SANCTURA) 20 MG tablet, Take 20 mg by mouth 2 (two) times daily., Disp: , Rfl:    vitamin E 400 UNIT capsule, Take 800 Units by mouth daily. , Disp: , Rfl:   Current Facility-Administered Medications:    0.9 %  sodium chloride infusion, 500 mL, Intravenous, Once, Danis, Estill Cotta III, MD   Objective:     BP 118/66   Pulse 64   Temp (!) 97.1 F (36.2 C) (Temporal)   Ht '5\' 11"'$  (1.803 m)   Wt 204 lb 12.8 oz (92.9 kg)   SpO2 94%   BMI 28.56 kg/m    Physical Exam Constitutional:      General: He is not in acute distress.    Appearance: Normal appearance. He is not ill-appearing, toxic-appearing or diaphoretic.  HENT:     Head: Normocephalic and atraumatic.     Right Ear: External ear normal.     Left Ear: External ear normal.     Mouth/Throat:     Mouth: Mucous membranes are moist.     Pharynx: Oropharynx  is clear. No oropharyngeal exudate or posterior oropharyngeal erythema.  Eyes:     General: No scleral icterus.       Right eye: No discharge.        Left eye: No discharge.     Extraocular Movements: Extraocular movements intact.     Conjunctiva/sclera: Conjunctivae normal.     Pupils: Pupils are equal, round, and reactive to light.  Cardiovascular:     Rate and Rhythm: Normal rate and regular rhythm.  Pulmonary:     Effort: Pulmonary effort is normal. No respiratory distress.     Breath sounds: Normal breath sounds.  Abdominal:     General: Bowel sounds are normal.  Musculoskeletal:     Cervical back: No rigidity or tenderness.  Skin:    General: Skin is warm and dry.  Neurological:     Mental Status: He is alert and oriented to  person, place, and time.  Psychiatric:        Mood and Affect: Mood normal.        Behavior: Behavior normal.      No results found for any visits on 02/16/22.    The ASCVD Risk score (Arnett DK, et al., 2019) failed to calculate for the following reasons:   The valid total cholesterol range is 130 to 320 mg/dL    Assessment & Plan:   Radiation cystitis -     Urinalysis, Routine w reflex microscopic  Elevated LDL cholesterol level -     Comprehensive metabolic panel -     Lipid panel  B12 deficiency -     CBC -     Vitamin B12    Return in about 6 months (around 08/17/2022).  Follow-up of above.  Adjustments made pending results of today's labs.  Libby Maw, MD

## 2022-02-19 ENCOUNTER — Ambulatory Visit (AMBULATORY_SURGERY_CENTER): Payer: Medicare HMO | Admitting: Gastroenterology

## 2022-02-19 ENCOUNTER — Encounter: Payer: Self-pay | Admitting: Gastroenterology

## 2022-02-19 VITALS — BP 142/65 | HR 88 | Temp 97.7°F | Resp 24 | Ht 71.0 in | Wt 208.0 lb

## 2022-02-19 DIAGNOSIS — K5730 Diverticulosis of large intestine without perforation or abscess without bleeding: Secondary | ICD-10-CM | POA: Diagnosis not present

## 2022-02-19 DIAGNOSIS — K625 Hemorrhage of anus and rectum: Secondary | ICD-10-CM

## 2022-02-19 DIAGNOSIS — K640 First degree hemorrhoids: Secondary | ICD-10-CM | POA: Diagnosis not present

## 2022-02-19 DIAGNOSIS — Z86010 Personal history of colonic polyps: Secondary | ICD-10-CM

## 2022-02-19 DIAGNOSIS — D120 Benign neoplasm of cecum: Secondary | ICD-10-CM | POA: Diagnosis not present

## 2022-02-19 DIAGNOSIS — D123 Benign neoplasm of transverse colon: Secondary | ICD-10-CM

## 2022-02-19 DIAGNOSIS — I10 Essential (primary) hypertension: Secondary | ICD-10-CM | POA: Diagnosis not present

## 2022-02-19 DIAGNOSIS — K635 Polyp of colon: Secondary | ICD-10-CM | POA: Diagnosis not present

## 2022-02-19 DIAGNOSIS — I2510 Atherosclerotic heart disease of native coronary artery without angina pectoris: Secondary | ICD-10-CM | POA: Diagnosis not present

## 2022-02-19 MED ORDER — SODIUM CHLORIDE 0.9 % IV SOLN: 500.0000 mL | INTRAVENOUS | Status: DC

## 2022-02-19 NOTE — Progress Notes (Signed)
Pt resting comfortably. VSS. Airway intact. SBAR complete to RN. All questions answered. Bilateral hearing aids in place per pre procedure

## 2022-02-19 NOTE — Progress Notes (Signed)
History and Physical:  This patient presents for endoscopic testing for: Encounter Diagnosis  Name Primary?   Hx of colonic polyps Yes    77 year old man here for surveillance colonoscopy.  Clinical details in office note of 12/19/2021. In August 2020, EMR of a cecal polyp was performed. In September 2021, a 6 mm polyp remnant at that polypectomy site was removed as well as 2 additional small tubular adenomas. Patient denies chronic abdominal pain, rectal bleeding, constipation or diarrhea. He reports no clinical changes since the last office visit.  Patient is otherwise without complaints or active issues today.   Past Medical History: Past Medical History:  Diagnosis Date   Abnormal liver enzymes    Allergic rhinitis    Allergy    seasonal and environmental   CAD (coronary artery disease)    Cataract    Colon polyps    Diverticulosis    ED (erectile dysfunction)    GERD (gastroesophageal reflux disease)    Heart murmur    20 yrs ago   Hemochromatosis    HTN (hypertension)    Hyperlipemia    Meniere's disease    Prostate cancer ([place])    Retinal detachment      Past Surgical History: Past Surgical History:  Procedure Laterality Date   APPENDECTOMY     BASAL CELL CARCINOMA EXCISION  2014   behind right knee and back   cataracts      COLONOSCOPY  2020   COLONOSCOPY WITH PROPOFOL N/A 09/23/2018   Procedure: COLONOSCOPY WITH PROPOFOL;  Surgeon: Doran Stabler, MD;  Location: [facility];  Service: Gastroenterology;  Laterality: N/A;   COLONOSCOPY WITH PROPOFOL N/A 10/26/2019   Procedure: COLONOSCOPY WITH PROPOFOL;  Surgeon: Doran Stabler, MD;  Location: [facility];  Service: Gastroenterology;  Laterality: N/A;   HEMOSTASIS CLIP PLACEMENT  09/23/2018   Procedure: HEMOSTASIS CLIP PLACEMENT;  Surgeon: Doran Stabler, MD;  Location: [facility];  Service: Gastroenterology;;   HOT HEMOSTASIS N/A 09/23/2018   Procedure: HOT HEMOSTASIS (ARGON PLASMA  COAGULATION/BICAP);  Surgeon: Doran Stabler, MD;  Location: [facility];  Service: Gastroenterology;  Laterality: N/A;   HOT HEMOSTASIS N/A 10/26/2019   Procedure: HOT HEMOSTASIS (ARGON PLASMA COAGULATION/BICAP);  Surgeon: Doran Stabler, MD;  Location: [facility];  Service: Gastroenterology;  Laterality: N/A;   POLYPECTOMY     POLYPECTOMY  09/23/2018   Procedure: POLYPECTOMY;  Surgeon: Doran Stabler, MD;  Location: [facility];  Service: Gastroenterology;;   POLYPECTOMY  10/26/2019   Procedure: POLYPECTOMY;  Surgeon: Doran Stabler, MD;  Location: [facility];  Service: Gastroenterology;;   PROSTATECTOMY     RETINAL DETACHMENT SURGERY     SUBMUCOSAL LIFTING INJECTION  09/23/2018   Procedure: SUBMUCOSAL LIFTING INJECTION;  Surgeon: Doran Stabler, MD;  Location: [facility];  Service: Gastroenterology;;   tripple bypass N/A 12/07/2015    Allergies: Allergies  Allergen Reactions   Penicillin G Hives    Did it involve swelling of the face/tongue/throat, SOB, or low BP? Unknown Did it involve sudden or severe rash/hives, skin peeling, or any reaction on the inside of your mouth or nose? Unknown Did you need to seek medical attention at a hospital or doctor's office? No When did it last happen? Occurred when he was a teenager If all above answers are "NO", may proceed with cephalosporin use.     Atorvastatin Other (See Comments)    Causes muscle aches/cramps and tremors in fingers  Iodinated Contrast Media Hives    Years ago, 1 small hive on neck   Omeprazole Rash    In high doses    Shellfish Allergy Nausea Only, Rash and Other (See Comments)    Depends on the type of shellfish    Outpatient Meds: Current Outpatient Medications  Medication Sig Dispense Refill   aspirin EC 81 MG tablet Take 81 mg by mouth at bedtime.      carvedilol (COREG) 6.25 MG tablet Take 1 tablet (6.25 mg total) by mouth 2 (two) times daily. 180 tablet 3   Copper Gluconate 2 MG  CAPS Take 1 capsule by mouth daily.     cyanocobalamin 1000 MCG tablet Take by mouth.     fluticasone (FLONASE) 50 MCG/ACT nasal spray Place 2 sprays into both nostrils daily.     losartan (COZAAR) 25 MG tablet Take 25 mg by mouth daily.     Polyethyl Glycol-Propyl Glycol (LUBRICANT EYE DROPS) 0.4-0.3 % SOLN Place 1 drop into both eyes 3 (three) times daily as needed (dry/irritated eyes.).     potassium chloride SA (KLOR-CON M) 20 MEQ tablet Take by mouth.     rosuvastatin (CRESTOR) 20 MG tablet Take 20 mg by mouth every evening.      trospium (SANCTURA) 20 MG tablet Take 20 mg by mouth 2 (two) times daily.     vitamin E 400 UNIT capsule Take 800 Units by mouth daily.      Multiple Vitamins-Minerals (SYSTANE ICAPS AREDS2) TABS Take by mouth.     Current Facility-Administered Medications  Medication Dose Route Frequency Provider Last Rate Last Admin   0.9 %  sodium chloride infusion  500 mL Intravenous Once Danis, Estill Cotta III, MD       0.9 %  sodium chloride infusion  500 mL Intravenous Continuous Danis, Estill Cotta III, MD          ___________________________________________________________________ Objective   Exam:  BP 139/76   Pulse 96   Temp 97.7 F (36.5 C) (Temporal)   Ht '5\' 11"'$  (1.803 m)   Wt 208 lb (94.3 kg)   SpO2 96%   BMI 29.01 kg/m   CV: regular , S1/S2 Resp: clear to auscultation bilaterally, normal RR and effort noted GI: soft, no tenderness, with active bowel sounds.   Assessment: Encounter Diagnosis  Name Primary?   Hx of colonic polyps Yes     Plan: Colonoscopy  The benefits and risks of the planned procedure were described in detail with the patient or (when appropriate) their health care proxy.  Risks were outlined as including, but not limited to, bleeding, infection, perforation, adverse medication reaction leading to cardiac or pulmonary decompensation, pancreatitis (if ERCP).  The limitation of incomplete mucosal visualization was also discussed.  No  guarantees or warranties were given.    The patient is appropriate for an endoscopic procedure in the ambulatory setting.   - Wilfrid Lund, MD

## 2022-02-19 NOTE — Progress Notes (Signed)
Called to room to assist during endoscopic procedure.  Patient ID and intended procedure confirmed with present staff. Received instructions for my participation in the procedure from the performing physician.  

## 2022-02-19 NOTE — Op Note (Addendum)
[facility] Patient Name: Jeffrey Stein Procedure Date: 02/19/2022 8:35 AM MRN: [id number] Endoscopist: Laplace. Loletha Carrow , MD, [id number] Age: 77 Referring MD:  Date of Birth: 12-14-1945 Gender: Male Account #: [id number] Procedure:                Colonoscopy Indications:              Surveillance: History of piecemeal removal adenoma                            on last colonoscopy (< 3 yrs)                           EMR of cecal polyp August 2020                           September 2021 - 6 mm remnant at cecal polypectomy                            site and 2 additional diminutive tubular adenomas                            removed.                           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 Medicines:                Monitored Anesthesia Care Procedure:                Pre-Anesthesia Assessment:                           - Prior to the procedure, a History and Physical                            was performed, and patient medications and                            allergies were reviewed. The patient's tolerance of                            previous anesthesia was also reviewed. The risks                            and benefits of the procedure and the sedation                            options and  risks were discussed with the patient.                            All questions were answered, and informed consent                            was obtained. Prior Anticoagulants: The patient has                            taken no anticoagulant or antiplatelet agents. ASA                            Grade Assessment: II - A patient with mild systemic                            disease. After reviewing the risks and benefits,                            the patient was  deemed in satisfactory condition to                            undergo the procedure.                           After obtaining informed consent, the colonoscope                            was passed under direct vision. Throughout the                            procedure, the patient's blood pressure, pulse, and                            oxygen saturations were monitored continuously. The                            Colonoscope was introduced through the anus and                            advanced to the the cecum, identified by                            appendiceal orifice and ileocecal valve. The                            colonoscopy was somewhat difficult due to multiple                            diverticula in the colon, a redundant colon and a                            tortuous colon. Successful completion of the  procedure was aided by using manual pressure,                            straightening and shortening the scope to obtain                            bowel loop reduction and lavage. The patient                            tolerated the procedure well. The quality of the                            bowel preparation was generally good, with some                            scattered fibrous debris that could not be                            completely cleared. The ileocecal valve,                            appendiceal orifice, and rectum were photographed.                            The bowel preparation used was SUPREP via split                            dose instruction. Scope In: 8:43:29 AM Scope Out: 9:01:51 AM Scope Withdrawal Time: 0 hours 11 minutes 5 seconds  Total Procedure Duration: 0 hours 18 minutes 22 seconds  Findings:                 The perianal and digital rectal examinations were                            normal.                           A 3 mm polyp was found in the cecum. The polyp was                             semi-sessile. The polyp was removed with a cold                            snare. Resection and retrieval were complete.                           A 5 mm polyp was found in the transverse colon. The                            polyp was semi-pedunculated. The polyp was removed                            with a cold snare. Resection and retrieval were  complete. (Both polyps same jar)                           Repeat examination of right colon under NBI                            performed.                           Multiple diverticula were found in the left colon                            and right colon.                           The mucosa vascular pattern in the distal rectum                            was locally increased. (Radiation proctopathy)                            -this finding extends over the internal                            hemorrhoidal plexus.                           Internal hemorrhoids were found. The hemorrhoids                            were Grade I (internal hemorrhoids that do not                            prolapse).                           The exam was otherwise without abnormality on                            direct and retroflexion views. Complications:            No immediate complications. Estimated Blood Loss:     Estimated blood loss was minimal. Impression:               - One 3 mm polyp in the cecum, removed with a cold                            snare. Resected and retrieved.                           - One 5 mm polyp in the transverse colon, removed                            with a cold snare. Resected and retrieved.                           - Diverticulosis  in the left colon and in the right                            colon.                           - Increased mucosa vascular pattern in the distal                            rectum.                           - Internal hemorrhoids.                           -  The examination was otherwise normal on direct                            and retroflexion views. Recommendation:           - Patient has a contact number available for                            emergencies. The signs and symptoms of potential                            delayed complications were discussed with the                            patient. Return to normal activities tomorrow.                            Written discharge instructions were provided to the                            patient.                           - Resume previous diet.                           - Continue present medications.                           - Await pathology results.                           - No repeat surveillance colonoscopy recommended.                            (can discuss risk/benefit ratio in 3 years if                            patient wishes to do so) Lorie Cleckley L. Loletha Carrow, MD 02/19/2022 9:12:29 AM This report has been signed electronically.

## 2022-02-19 NOTE — Patient Instructions (Signed)
Discharge instructions given. Handouts on polyps,diverticulosis and hemorrhoids. Resume previous medications. YOU HAD AN ENDOSCOPIC PROCEDURE TODAY AT THE [facility]:   Refer to the procedure report that was given to you for any specific questions about what was found during the examination.  If the procedure report does not answer your questions, please call your gastroenterologist to clarify.  If you requested that your care partner not be given the details of your procedure findings, then the procedure report has been included in a sealed envelope for you to review at your convenience later.  YOU SHOULD EXPECT: Some feelings of bloating in the abdomen. Passage of more gas than usual.  Walking can help get rid of the air that was put into your GI tract during the procedure and reduce the bloating. If you had a lower endoscopy (such as a colonoscopy or flexible sigmoidoscopy) you may notice spotting of blood in your stool or on the toilet paper. If you underwent a bowel prep for your procedure, you may not have a normal bowel movement for a few days.  Please Note:  You might notice some irritation and congestion in your nose or some drainage.  This is from the oxygen used during your procedure.  There is no need for concern and it should clear up in a day or so.  SYMPTOMS TO REPORT IMMEDIATELY:  Following lower endoscopy (colonoscopy or flexible sigmoidoscopy):  Excessive amounts of blood in the stool  Significant tenderness or worsening of abdominal pains  Swelling of the abdomen that is new, acute  Fever of 100F or higher   For urgent or emergent issues, a gastroenterologist can be reached at any hour by calling [phone]. Do not use MyChart messaging for urgent concerns.    DIET:  We do recommend a small meal at first, but then you may proceed to your regular diet.  Drink plenty of fluids but you should avoid alcoholic beverages for 24 hours.  ACTIVITY:  You should  plan to take it easy for the rest of today and you should NOT DRIVE or use heavy machinery until tomorrow (because of the sedation medicines used during the test).    FOLLOW UP: Our staff will call the number listed on your records the next business day following your procedure.  We will call around 7:15- 8:00 am to check on you and address any questions or concerns that you may have regarding the information given to you following your procedure. If we do not reach you, we will leave a message.     If any biopsies were taken you will be contacted by phone or by letter within the next 1-3 weeks.  Please call us at [phone] if you have not heard about the biopsies in 3 weeks.    SIGNATURES/CONFIDENTIALITY: You and/or your care partner have signed paperwork which will be entered into your electronic medical record.  These signatures attest to the fact that that the information above on your After Visit Summary has been reviewed and is understood.  Full responsibility of the confidentiality of this discharge information lies with you and/or your care-partner. 

## 2022-02-20 ENCOUNTER — Telehealth: Payer: Self-pay

## 2022-02-20 NOTE — Telephone Encounter (Signed)
Follow up call to pt, lm for pt to call if having any difficulty with normal activities or eating and drinking.  Also to call if any other questions or concerns.  

## 2022-02-27 ENCOUNTER — Encounter: Payer: Self-pay | Admitting: Gastroenterology

## 2022-02-28 DIAGNOSIS — N3001 Acute cystitis with hematuria: Secondary | ICD-10-CM | POA: Diagnosis not present

## 2022-03-05 DIAGNOSIS — Q142 Congenital malformation of optic disc: Secondary | ICD-10-CM | POA: Diagnosis not present

## 2022-03-05 DIAGNOSIS — H40021 Open angle with borderline findings, high risk, right eye: Secondary | ICD-10-CM | POA: Diagnosis not present

## 2022-03-05 DIAGNOSIS — Z961 Presence of intraocular lens: Secondary | ICD-10-CM | POA: Diagnosis not present

## 2022-03-05 DIAGNOSIS — H40022 Open angle with borderline findings, high risk, left eye: Secondary | ICD-10-CM | POA: Diagnosis not present

## 2022-03-19 ENCOUNTER — Ambulatory Visit (INDEPENDENT_AMBULATORY_CARE_PROVIDER_SITE_OTHER): Payer: Medicare HMO

## 2022-03-19 VITALS — Ht 71.0 in | Wt 195.0 lb

## 2022-03-19 DIAGNOSIS — Z0000 Encounter for general adult medical examination without abnormal findings: Secondary | ICD-10-CM

## 2022-03-19 NOTE — Progress Notes (Signed)
I connected with  [name] on 03/19/22 by a audio enabled telemedicine application and verified that I am speaking with the correct person using two identifiers.  Patient Location: Home  Provider Location: Office/Clinic  I discussed the limitations of evaluation and management by telemedicine. The patient expressed understanding and agreed to proceed.  Subjective:   Jeffrey Stein is a 77 y.o. male who presents for Medicare Annual/Subsequent preventive examination.  Review of Systems     Cardiac Risk Factors include: advanced age (>25mn, >>34women);dyslipidemia;hypertension;male gender     Objective:    Today's Vitals   03/19/22 1440  Weight: 195 lb (88.5 kg)  Height: 5' 11"$  (1.803 m)   Body mass index is 27.2 kg/m.     03/19/2022    2:43 PM 04/05/2020    8:23 AM 10/26/2019    8:12 AM 10/08/2018   11:03 AM 09/23/2018    7:24 AM 09/22/2018   10:39 AM  Advanced Directives  Does Patient Have a Medical Advance Directive? Yes Yes Yes Yes Yes Yes  Type of AParamedicof AFlora VistaLiving will HW[place]Living will HNashLiving will HCrows LandingLiving will Healthcare Power of APul[place]iLiving will  Does patient want to make changes to medical advance directive?    No - Patient declined    Copy of [place] Chart? No - copy requested No - copy requested No - copy requested Yes - validated most recent copy scanned in chart (See row information) No - copy requested     Current Medications (verified) Outpatient Encounter Medications as of 03/19/2022  Medication Sig   aspirin EC 81 MG tablet Take 81 mg by mouth at bedtime.    carvedilol (COREG) 6.25 MG tablet Take 1 tablet (6.25 mg total) by mouth 2 (two) times daily.   Copper Gluconate 2 MG CAPS Take 1 capsule by mouth daily.   cyanocobalamin 1000 MCG tablet Take by mouth.   fluticasone (FLONASE) 50  MCG/ACT nasal spray Place 2 sprays into both nostrils daily.   losartan (COZAAR) 25 MG tablet Take 25 mg by mouth daily.   Multiple Vitamins-Minerals (SYSTANE ICAPS AREDS2) TABS Take by mouth.   Polyethyl Glycol-Propyl Glycol (LUBRICANT EYE DROPS) 0.4-0.3 % SOLN Place 1 drop into both eyes 3 (three) times daily as needed (dry/irritated eyes.).   potassium chloride SA (KLOR-CON M) 20 MEQ tablet Take by mouth.   rosuvastatin (CRESTOR) 20 MG tablet Take 20 mg by mouth every evening.    trospium (SANCTURA) 20 MG tablet Take 20 mg by mouth 2 (two) times daily.   vitamin E 400 UNIT capsule Take 800 Units by mouth daily.    Facility-Administered Encounter Medications as of 03/19/2022  Medication   0.9 %  sodium chloride infusion    Allergies (verified) Penicillin g, Atorvastatin, Iodinated contrast media, Omeprazole, and Shellfish allergy   History: Past Medical History:  Diagnosis Date   Abnormal liver enzymes    Allergic rhinitis    Allergy    seasonal and environmental   CAD (coronary artery disease)    Cataract    Colon polyps    Diverticulosis    ED (erectile dysfunction)    GERD (gastroesophageal reflux disease)    Heart murmur    20 yrs ago   Hemochromatosis    HTN (hypertension)    Hyperlipemia    Meniere's disease    Prostate cancer ([place]    Retinal detachment  Past Surgical History:  Procedure Laterality Date   APPENDECTOMY     BASAL CELL CARCINOMA EXCISION  2014   behind right knee and back   cataracts      COLONOSCOPY  2020   COLONOSCOPY WITH PROPOFOL N/A 09/23/2018   Procedure: COLONOSCOPY WITH PROPOFOL;  Surgeon: Doran Stabler, MD;  Location: [facility];  Service: Gastroenterology;  Laterality: N/A;   COLONOSCOPY WITH PROPOFOL N/A 10/26/2019   Procedure: COLONOSCOPY WITH PROPOFOL;  Surgeon: Doran Stabler, MD;  Location: [facility];  Service: Gastroenterology;  Laterality: N/A;   HEMOSTASIS CLIP PLACEMENT  09/23/2018   Procedure: HEMOSTASIS CLIP  PLACEMENT;  Surgeon: Doran Stabler, MD;  Location: [facility];  Service: Gastroenterology;;   HOT HEMOSTASIS N/A 09/23/2018   Procedure: HOT HEMOSTASIS (ARGON PLASMA COAGULATION/BICAP);  Surgeon: Doran Stabler, MD;  Location: [facility];  Service: Gastroenterology;  Laterality: N/A;   HOT HEMOSTASIS N/A 10/26/2019   Procedure: HOT HEMOSTASIS (ARGON PLASMA COAGULATION/BICAP);  Surgeon: Doran Stabler, MD;  Location: [facility];  Service: Gastroenterology;  Laterality: N/A;   POLYPECTOMY     POLYPECTOMY  09/23/2018   Procedure: POLYPECTOMY;  Surgeon: Doran Stabler, MD;  Location: [facility];  Service: Gastroenterology;;   POLYPECTOMY  10/26/2019   Procedure: POLYPECTOMY;  Surgeon: Doran Stabler, MD;  Location: [facility];  Service: Gastroenterology;;   PROSTATECTOMY     RETINAL DETACHMENT SURGERY     SUBMUCOSAL LIFTING INJECTION  09/23/2018   Procedure: SUBMUCOSAL LIFTING INJECTION;  Surgeon: Doran Stabler, MD;  Location: [facility];  Service: Gastroenterology;;   tripple bypass N/A 12/07/2015   Family History  Problem Relation Age of Onset   Colon cancer Mother    Emphysema Father    Colon cancer Brother    Stomach cancer Neg Hx    Pancreatic cancer Neg Hx    Esophageal cancer Neg Hx    Rectal cancer Neg Hx    Colon polyps Neg Hx    Social History   Socioeconomic History   Marital status: Married    Spouse name: [place]   Number of children: Not on file   Years of education: Not on file   Highest education level: Not on file  Occupational History   Not on file  Tobacco Use   Smoking status: Never   Smokeless tobacco: Never  Vaping Use   Vaping Use: Never used  Substance and Sexual Activity   Alcohol use: Yes    Comment: 2 cocktails per day    Drug use: No   Sexual activity: Not on file  Other Topics Concern   Not on file  Social History Narrative   Not on file   Social Determinants of Health   Financial Resource Strain: Low Risk   (03/19/2022)   Overall Financial Resource Strain (CARDIA)    Difficulty of Paying Living Expenses: Not hard at all  Food Insecurity: No Food Insecurity (03/19/2022)   Hunger Vital Sign    Worried About Running Out of Food in the Last Year: Never true    [place] in the Last Year: Never true  Transportation Needs: No Transportation Needs (03/19/2022)   PRAPARE - Hydrologist (Medical): No    Lack of Transportation (Non-Medical): No  Physical Activity: Insufficiently Active (03/19/2022)   Exercise Vital Sign    Days of Exercise per Week: 1 day    Minutes of Exercise per Session:  30 min  Stress: No Stress Concern Present (03/19/2022)   [place]    Feeling of Stress : Not at all  Social Connections: [place] (04/05/2020)   Social Connection and Isolation Panel [NHANES]    Frequency of Communication with Friends and Family: More than three times a week    Frequency of Social Gatherings with Friends and Family: More than three times a week    Attends Religious Services: More than 4 times per year    Active Member of [employer] or Organizations: Yes    Attends Archivist Meetings: 1 to 4 times per year    Marital Status: Married    Tobacco Counseling Counseling given: Not Answered   Clinical Intake:  Pre-visit preparation completed: Yes  Pain : No/denies pain     Nutritional Status: BMI 25 -29 Overweight Nutritional Risks: None Diabetes: No  How often do you need to have someone help you when you read instructions, pamphlets, or other written materials from your doctor or pharmacy?: 1 - Never  Diabetic? no  Interpreter Needed?: No  Information entered by :: NAllen LPN   Activities of Daily Living    03/19/2022    2:44 PM  In your present state of health, do you have any difficulty performing the following activities:  Hearing? 0  Comment has hearing aids   Vision? 0  Difficulty concentrating or making decisions? 0  Walking or climbing stairs? 0  Dressing or bathing? 0  Doing errands, shopping? 0  Preparing Food and eating ? N  Using the Toilet? N  In the past six months, have you accidently leaked urine? Y  Comment since prostate surgery  Do you have problems with loss of bowel control? N  Managing your Medications? N  Managing your Finances? N  Housekeeping or managing your Housekeeping? N    Patient Care Team: Libby Maw, MD as PCP - General (Family Medicine)  Indicate any recent Medical Services you may have received from other than [facility] providers in the past year (date may be approximate).     Assessment:   This is a routine wellness examination for Noe.  Hearing/Vision screen Vision Screening - Comments:: Regular eye exams, [facility], [facility]  Dietary issues and exercise activities discussed: Current Exercise Habits: Home exercise routine, Type of exercise: walking, Time (Minutes): 30, Frequency (Times/Week): 1, Weekly Exercise (Minutes/Week): 30   Goals Addressed             This Visit's Progress    Patient Stated       03/19/2022, increase exercise       Depression Screen    03/19/2022    2:44 PM 06/30/2021    9:50 AM 06/30/2021    9:02 AM 09/20/2020    1:26 PM 04/05/2020    8:24 AM 11/24/2019   12:03 PM 10/08/2018   11:03 AM  PHQ 2/9 Scores  PHQ - 2 Score 0 0 0 0 0 0 0  PHQ- 9 Score  3    1     Fall Risk    03/19/2022    2:44 PM 06/30/2021    9:02 AM 09/20/2020    1:26 PM 04/05/2020    8:24 AM 11/24/2019   11:33 AM  Fall Risk   Falls in the past year? 0 0 0 1 1  Number falls in past yr: 0 0  0 1  Injury with Fall? 0   0 0  Comment     bruised left side of face.  Risk for fall due to : Medication side effect      Follow up Falls prevention discussed;Education provided;Falls evaluation completed   Falls prevention discussed     FALL RISK PREVENTION PERTAINING TO THE HOME:  Any  stairs in or around the home? Yes  If so, are there any without handrails? No  Home free of loose throw rugs in walkways, pet beds, electrical cords, etc? Yes  Adequate lighting in your home to reduce risk of falls? Yes   ASSISTIVE DEVICES UTILIZED TO PREVENT FALLS:  Life alert? No  Use of a cane, walker or w/c? No  Grab bars in the bathroom? Yes  Shower chair or bench in shower? Yes  Elevated toilet seat or a handicapped toilet? Yes   TIMED UP AND GO:  Was the test performed? No .      Cognitive Function:        03/19/2022    2:45 PM 10/08/2018   11:12 AM  6CIT Screen  What Year? 0 points 0 points  What month? 0 points 0 points  What time? 0 points 0 points  Count back from 20 0 points 0 points  Months in reverse 0 points 0 points  Repeat phrase 0 points 0 points  Total Score 0 points 0 points    Immunizations Immunization History  Administered Date(s) Administered   Fluad Quad(high Dose 65+) 10/15/2018, 10/26/2021   Influenza, High Dose Seasonal PF 10/30/2016, 11/25/2017   Influenza-Unspecified 11/19/2016, 11/06/2019   Moderna Sars-Covid-2 Vaccination 08/05/2020, 10/29/2021   PFIZER Comirnaty(Gray Top)Covid-19 Tri-Sucrose Vaccine 11/06/2019   PFIZER(Purple Top)SARS-COV-2 Vaccination 03/01/2019, 03/23/2019, 11/03/2019   Pneumococcal Conjugate-13 12/10/2014   Pneumococcal Polysaccharide-23 08/20/2011   Zoster Recombinat (Shingrix) 09/16/2017, 11/25/2017    TDAP status: Up to date  Flu Vaccine status: Up to date  Pneumococcal vaccine status: Up to date  Covid-19 vaccine status: Completed vaccines  Qualifies for Shingles Vaccine? Yes   Zostavax completed Yes   Shingrix Completed?: Yes  Screening Tests Health Maintenance  Topic Date Due   DTaP/Tdap/Td (1 - Tdap) Never done   Medicare Annual Wellness (AWV)  04/05/2021   COLONOSCOPY (Pts 45-16yr Insurance coverage will need to be confirmed)  02/19/2025   Pneumonia Vaccine 77 Years old  Completed    INFLUENZA VACCINE  Completed   Hepatitis C Screening  Completed   Zoster Vaccines- Shingrix  Completed   HPV VACCINES  Aged Out   COVID-19 Vaccine  Discontinued    Health Maintenance  Health Maintenance Due  Topic Date Due   DTaP/Tdap/Td (1 - Tdap) Never done   Medicare Annual Wellness (AWV)  04/05/2021    Colorectal cancer screening: Type of screening: Colonoscopy. Completed 02/19/2022. Repeat every 3 years  Lung Cancer Screening: (Low Dose CT Chest recommended if Age 63101-80years, 30 pack-year currently smoking OR have quit w/in 15years.) does not qualify.   Lung Cancer Screening Referral: no  Additional Screening:  Hepatitis C Screening: does qualify; Completed 10/15/2018  Vision Screening: Recommended annual ophthalmology exams for early detection of glaucoma and other disorders of the eye. Is the patient up to date with their annual eye exam?  Yes  Who is the provider or what is the name of the office in which the patient attends annual eye exams? [facility] is not established with a provider, would they like to be referred to a provider to establish care? No .  Dental Screening: Recommended annual dental exams for proper oral hygiene  Community Resource Referral / Chronic Care Management: CRR required this visit?  No   CCM required this visit?  No      Plan:     I have personally reviewed and noted the following in the patient's chart:   Medical and social history Use of alcohol, tobacco or illicit drugs  Current medications and supplements including opioid prescriptions. Patient is not currently taking opioid prescriptions. Functional ability and status Nutritional status Physical activity Advanced directives List of other physicians Hospitalizations, surgeries, and ER visits in previous 12 months Vitals Screenings to include cognitive, depression, and falls Referrals and appointments  In addition, I have reviewed and discussed with  patient certain preventive protocols, quality metrics, and best practice recommendations. A written personalized care plan for preventive services as well as general preventive health recommendations were provided to patient.     Kellie Simmering, LPN   [id number]   Nurse Notes: none  Due to this being a virtual visit, the after visit summary with patients personalized plan was offered to patient via mail or my-chart.  Patient would like to access on my-chart

## 2022-03-19 NOTE — Patient Instructions (Signed)
Mr. Jeffrey Stein , Thank you for taking time to come for your Medicare Wellness Visit. I appreciate your ongoing commitment to your health goals. Please review the following plan we discussed and let me know if I can assist you in the future.   These are the goals we discussed:  Goals      Patient Stated     Increase activity     Patient Stated     03/19/2022, increase exercise        This is a list of the screening recommended for you and due dates:  Health Maintenance  Topic Date Due   DTaP/Tdap/Td vaccine (1 - Tdap) Never done   Medicare Annual Wellness Visit  03/20/2023   Colon Cancer Screening  02/19/2025   Pneumonia Vaccine  Completed   Flu Shot  Completed   Hepatitis C Screening: USPSTF Recommendation to screen - Ages 18-79 yo.  Completed   Zoster (Shingles) Vaccine  Completed   HPV Vaccine  Aged Out   COVID-19 Vaccine  Discontinued    Advanced directives: Please bring a copy of your POA (Power of [place]) and/or Living Will to your next appointment.   Conditions/risks identified: none  Next appointment: Follow up in one year for your annual wellness visit.   Preventive Care 65 Years and Older, Male  Preventive care refers to lifestyle choices and visits with your health care provider that can promote health and wellness. What does preventive care include? A yearly physical exam. This is also called an annual well check. Dental exams once or twice a year. Routine eye exams. Ask your health care provider how often you should have your eyes checked. Personal lifestyle choices, including: Daily care of your teeth and gums. Regular physical activity. Eating a healthy diet. Avoiding tobacco and drug use. Limiting alcohol use. Practicing safe sex. Taking low doses of aspirin every day. Taking vitamin and mineral supplements as recommended by your health care provider. What happens during an annual well check? The services and screenings done by your health care  provider during your annual well check will depend on your age, overall health, lifestyle risk factors, and family history of disease. Counseling  Your health care provider may ask you questions about your: Alcohol use. Tobacco use. Drug use. Emotional well-being. Home and relationship well-being. Sexual activity. Eating habits. History of falls. Memory and ability to understand (cognition). Work and work Statistician. Screening  You may have the following tests or measurements: Height, weight, and BMI. Blood pressure. Lipid and cholesterol levels. These may be checked every 5 years, or more frequently if you are over 49 years old. Skin check. Lung cancer screening. You may have this screening every year starting at age 22 if you have a 30-pack-year history of smoking and currently smoke or have quit within the past 15 years. Fecal occult blood test (FOBT) of the stool. You may have this test every year starting at age 84. Flexible sigmoidoscopy or colonoscopy. You may have a sigmoidoscopy every 5 years or a colonoscopy every 10 years starting at age 54. Prostate cancer screening. Recommendations will vary depending on your family history and other risks. Hepatitis C blood test. Hepatitis B blood test. Sexually transmitted disease (STD) testing. Diabetes screening. This is done by checking your blood sugar (glucose) after you have not eaten for a while (fasting). You may have this done every 1-3 years. Abdominal aortic aneurysm (AAA) screening. You may need this if you are a current or former smoker. Osteoporosis. You  may be screened starting at age 28 if you are at high risk. Talk with your health care provider about your test results, treatment options, and if necessary, the need for more tests. Vaccines  Your health care provider may recommend certain vaccines, such as: Influenza vaccine. This is recommended every year. Tetanus, diphtheria, and acellular pertussis (Tdap, Td)  vaccine. You may need a Td booster every 10 years. Zoster vaccine. You may need this after age 15. Pneumococcal 13-valent conjugate (PCV13) vaccine. One dose is recommended after age 79. Pneumococcal polysaccharide (PPSV23) vaccine. One dose is recommended after age 53. Talk to your health care provider about which screenings and vaccines you need and how often you need them. This information is not intended to replace advice given to you by your health care provider. Make sure you discuss any questions you have with your health care provider. Document Released: 02/18/2015 Document Revised: 10/12/2015 Document Reviewed: 11/23/2014 Elsevier Interactive Patient Education  2017 [place] Prevention in the Home Falls can cause injuries. They can happen to people of all ages. There are many things you can do to make your home safe and to help prevent falls. What can I do on the outside of my home? Regularly fix the edges of walkways and driveways and fix any cracks. Remove anything that might make you trip as you walk through a door, such as a raised step or threshold. Trim any bushes or trees on the path to your home. Use bright outdoor lighting. Clear any walking paths of anything that might make someone trip, such as rocks or tools. Regularly check to see if handrails are loose or broken. Make sure that both sides of any steps have handrails. Any raised decks and porches should have guardrails on the edges. Have any leaves, snow, or ice cleared regularly. Use sand or salt on walking paths during winter. Clean up any spills in your garage right away. This includes oil or grease spills. What can I do in the bathroom? Use night lights. Install grab bars by the toilet and in the tub and shower. Do not use towel bars as grab bars. Use non-skid mats or decals in the tub or shower. If you need to sit down in the shower, use a plastic, non-slip stool. Keep the floor dry. Clean up any  water that spills on the floor as soon as it happens. Remove soap buildup in the tub or shower regularly. Attach bath mats securely with double-sided non-slip rug tape. Do not have throw rugs and other things on the floor that can make you trip. What can I do in the bedroom? Use night lights. Make sure that you have a light by your bed that is easy to reach. Do not use any sheets or blankets that are too big for your bed. They should not hang down onto the floor. Have a firm chair that has side arms. You can use this for support while you get dressed. Do not have throw rugs and other things on the floor that can make you trip. What can I do in the kitchen? Clean up any spills right away. Avoid walking on wet floors. Keep items that you use a lot in easy-to-reach places. If you need to reach something above you, use a strong step stool that has a grab bar. Keep electrical cords out of the way. Do not use floor polish or wax that makes floors slippery. If you must use wax, use non-skid floor wax. Do  not have throw rugs and other things on the floor that can make you trip. What can I do with my stairs? Do not leave any items on the stairs. Make sure that there are handrails on both sides of the stairs and use them. Fix handrails that are broken or loose. Make sure that handrails are as long as the stairways. Check any carpeting to make sure that it is firmly attached to the stairs. Fix any carpet that is loose or worn. Avoid having throw rugs at the top or bottom of the stairs. If you do have throw rugs, attach them to the floor with carpet tape. Make sure that you have a light switch at the top of the stairs and the bottom of the stairs. If you do not have them, ask someone to add them for you. What else can I do to help prevent falls? Wear shoes that: Do not have high heels. Have rubber bottoms. Are comfortable and fit you well. Are closed at the toe. Do not wear sandals. If you use a  stepladder: Make sure that it is fully opened. Do not climb a closed stepladder. Make sure that both sides of the stepladder are locked into place. Ask someone to hold it for you, if possible. Clearly mark and make sure that you can see: Any grab bars or handrails. First and last steps. Where the edge of each step is. Use tools that help you move around (mobility aids) if they are needed. These include: Canes. Walkers. Scooters. Crutches. Turn on the lights when you go into a dark area. Replace any light bulbs as soon as they burn out. Set up your furniture so you have a clear path. Avoid moving your furniture around. If any of your floors are uneven, fix them. If there are any pets around you, be aware of where they are. Review your medicines with your doctor. Some medicines can make you feel dizzy. This can increase your chance of falling. Ask your doctor what other things that you can do to help prevent falls. This information is not intended to replace advice given to you by your health care provider. Make sure you discuss any questions you have with your health care provider. Document Released: 11/18/2008 Document Revised: 06/30/2015 Document Reviewed: 02/26/2014 Elsevier Interactive Patient Education  2017 [employer].

## 2022-03-26 DIAGNOSIS — E610 Copper deficiency: Secondary | ICD-10-CM | POA: Diagnosis not present

## 2022-03-26 DIAGNOSIS — E538 Deficiency of other specified B group vitamins: Secondary | ICD-10-CM | POA: Diagnosis not present

## 2022-03-26 DIAGNOSIS — D61818 Other pancytopenia: Secondary | ICD-10-CM | POA: Diagnosis not present

## 2022-04-13 DIAGNOSIS — C61 Malignant neoplasm of prostate: Secondary | ICD-10-CM | POA: Diagnosis not present

## 2022-04-18 DIAGNOSIS — R748 Abnormal levels of other serum enzymes: Secondary | ICD-10-CM | POA: Diagnosis not present

## 2022-05-18 DIAGNOSIS — H52223 Regular astigmatism, bilateral: Secondary | ICD-10-CM | POA: Diagnosis not present

## 2022-07-04 DIAGNOSIS — I9789 Other postprocedural complications and disorders of the circulatory system, not elsewhere classified: Secondary | ICD-10-CM | POA: Diagnosis not present

## 2022-07-04 DIAGNOSIS — I2510 Atherosclerotic heart disease of native coronary artery without angina pectoris: Secondary | ICD-10-CM | POA: Diagnosis not present

## 2022-07-04 DIAGNOSIS — E7800 Pure hypercholesterolemia, unspecified: Secondary | ICD-10-CM | POA: Diagnosis not present

## 2022-07-04 DIAGNOSIS — I10 Essential (primary) hypertension: Secondary | ICD-10-CM | POA: Diagnosis not present

## 2022-07-04 DIAGNOSIS — I4891 Unspecified atrial fibrillation: Secondary | ICD-10-CM | POA: Diagnosis not present

## 2022-07-04 DIAGNOSIS — I350 Nonrheumatic aortic (valve) stenosis: Secondary | ICD-10-CM | POA: Diagnosis not present

## 2022-07-05 DIAGNOSIS — H04123 Dry eye syndrome of bilateral lacrimal glands: Secondary | ICD-10-CM | POA: Diagnosis not present

## 2022-07-05 DIAGNOSIS — H35373 Puckering of macula, bilateral: Secondary | ICD-10-CM | POA: Diagnosis not present

## 2022-07-05 DIAGNOSIS — Z8669 Personal history of other diseases of the nervous system and sense organs: Secondary | ICD-10-CM | POA: Diagnosis not present

## 2022-07-05 DIAGNOSIS — H40023 Open angle with borderline findings, high risk, bilateral: Secondary | ICD-10-CM | POA: Diagnosis not present

## 2022-07-05 DIAGNOSIS — H33022 Retinal detachment with multiple breaks, left eye: Secondary | ICD-10-CM | POA: Diagnosis not present

## 2022-07-05 DIAGNOSIS — H43811 Vitreous degeneration, right eye: Secondary | ICD-10-CM | POA: Diagnosis not present

## 2022-07-05 DIAGNOSIS — H33311 Horseshoe tear of retina without detachment, right eye: Secondary | ICD-10-CM | POA: Diagnosis not present

## 2022-07-05 DIAGNOSIS — H353132 Nonexudative age-related macular degeneration, bilateral, intermediate dry stage: Secondary | ICD-10-CM | POA: Diagnosis not present

## 2022-08-24 ENCOUNTER — Ambulatory Visit (INDEPENDENT_AMBULATORY_CARE_PROVIDER_SITE_OTHER): Payer: Medicare HMO | Admitting: Family Medicine

## 2022-08-24 ENCOUNTER — Encounter: Payer: Self-pay | Admitting: Family Medicine

## 2022-08-24 VITALS — BP 128/86 | HR 93 | Temp 97.4°F | Ht 71.0 in | Wt 201.8 lb

## 2022-08-24 DIAGNOSIS — E7800 Pure hypercholesterolemia, unspecified: Secondary | ICD-10-CM

## 2022-08-24 DIAGNOSIS — I10 Essential (primary) hypertension: Secondary | ICD-10-CM | POA: Diagnosis not present

## 2022-08-24 DIAGNOSIS — Z0000 Encounter for general adult medical examination without abnormal findings: Secondary | ICD-10-CM | POA: Diagnosis not present

## 2022-08-24 NOTE — Progress Notes (Signed)
Established Patient Office Visit   Subjective:  Patient ID: Jeffrey Stein, male    DOB: Sep 27, 1945  Age: 77 y.o. MRN: [id number]  Chief Complaint  Patient presents with   Medical Management of Chronic Issues    6 month follow up. Pt is fasting. No concerns or questions.     HPI Encounter Diagnoses  Name Primary?   Elevated LDL cholesterol level Yes   Essential hypertension    For follow up of above. Continues with carvedilol and losartan for well controlled htn. Cholesterol well controlled with rosuvastatin. No issues with meds.    Review of Systems  Constitutional: Negative.   HENT: Negative.    Eyes:  Negative for blurred vision, discharge and redness.  Respiratory: Negative.  Negative for shortness of breath.   Cardiovascular: Negative.  Negative for chest pain.  Gastrointestinal:  Negative for abdominal pain.  Genitourinary: Negative.   Musculoskeletal: Negative.  Negative for myalgias.  Skin:  Negative for rash.  Neurological:  Negative for tingling, loss of consciousness and weakness.  Endo/Heme/Allergies:  Negative for polydipsia.     Current Outpatient Medications:    aspirin EC 81 MG tablet, Take 81 mg by mouth at bedtime. , Disp: , Rfl:    carvedilol (COREG) 6.25 MG tablet, Take 1 tablet (6.25 mg total) by mouth 2 (two) times daily., Disp: 180 tablet, Rfl: 3   Copper Gluconate 2 MG CAPS, Take 1 capsule by mouth daily., Disp: , Rfl:    cyanocobalamin 1000 MCG tablet, Take by mouth., Disp: , Rfl:    fluticasone (FLONASE) 50 MCG/ACT nasal spray, Place 2 sprays into both nostrils daily., Disp: , Rfl:    losartan (COZAAR) 25 MG tablet, Take 25 mg by mouth daily., Disp: , Rfl:    Multiple Vitamins-Minerals (SYSTANE ICAPS AREDS2) TABS, Take by mouth., Disp: , Rfl:    Polyethyl Glycol-Propyl Glycol (LUBRICANT EYE DROPS) 0.4-0.3 % SOLN, Place 1 drop into both eyes 3 (three) times daily as needed (dry/irritated eyes.)., Disp: , Rfl:    potassium chloride (KLOR-CON) 20  MEQ packet, Take 20 mEq by mouth once., Disp: , Rfl:    rosuvastatin (CRESTOR) 20 MG tablet, Take 20 mg by mouth every evening. , Disp: , Rfl:    trospium (SANCTURA) 20 MG tablet, Take 20 mg by mouth 2 (two) times daily., Disp: , Rfl:    vitamin E 400 UNIT capsule, Take 800 Units by mouth daily. , Disp: , Rfl:   Current Facility-Administered Medications:    0.9 %  sodium chloride infusion, 500 mL, Intravenous, Once, Danis, Starr Lake III, MD   Objective:     BP 128/86   Pulse 93   Temp (!) 97.4 F (36.3 C)   Ht 5\' 11"  (1.803 m)   Wt 201 lb 12.8 oz (91.5 kg)   SpO2 94%   BMI 28.15 kg/m    Physical Exam Constitutional:      General: He is not in acute distress.    Appearance: Normal appearance. He is not ill-appearing, toxic-appearing or diaphoretic.  HENT:     Head: Normocephalic and atraumatic.     Right Ear: External ear normal.     Left Ear: External ear normal.  Eyes:     General: No scleral icterus.       Right eye: No discharge.        Left eye: No discharge.     Extraocular Movements: Extraocular movements intact.     Conjunctiva/sclera: Conjunctivae normal.  Cardiovascular:  Rate and Rhythm: Normal rate and regular rhythm.  Pulmonary:     Effort: Pulmonary effort is normal. No respiratory distress.     Breath sounds: Normal breath sounds.  Abdominal:     General: Bowel sounds are normal.  Musculoskeletal:     Cervical back: No rigidity or tenderness.  Skin:    General: Skin is warm and dry.  Neurological:     Mental Status: He is alert and oriented to person, place, and time.  Psychiatric:        Mood and Affect: Mood normal.        Behavior: Behavior normal.      No results found for any visits on 08/24/22.    The ASCVD Risk score (Arnett DK, et al., 2019) failed to calculate for the following reasons:   The valid total cholesterol range is 130 to 320 mg/dL    Assessment & Plan:   Elevated LDL cholesterol level -     Lipid panel  Essential  hypertension -     Basic metabolic panel    Return in about 6 months (around 02/24/2023), or if symptoms worsen or fail to improve.  Continue losartan and carvedilol. Continue rosuvastatin.   Mliss Sax, MD

## 2022-08-25 LAB — BASIC METABOLIC PANEL
BUN: 12 mg/dL (ref 7–25)
CO2: 23 mmol/L (ref 20–32)
Calcium: 9.2 mg/dL (ref 8.6–10.3)
Chloride: 109 mmol/L (ref 98–110)
Creat: 0.92 mg/dL (ref 0.70–1.28)
Glucose, Bld: 116 mg/dL — ABNORMAL HIGH (ref 65–99)
Potassium: 4.5 mmol/L (ref 3.5–5.3)
Sodium: 143 mmol/L (ref 135–146)

## 2022-08-25 LAB — LIPID PANEL
Cholesterol: 110 mg/dL (ref <200)
HDL: 45 mg/dL (ref >=40)
LDL Cholesterol (Calc): 50 mg/dL (calc)
Non-HDL Cholesterol (Calc): 65 mg/dL (calc) (ref <130)
Total CHOL/HDL Ratio: 2.4 (calc) (ref <5.0)
Triglycerides: 72 mg/dL (ref <150)

## 2022-08-31 DIAGNOSIS — H40023 Open angle with borderline findings, high risk, bilateral: Secondary | ICD-10-CM | POA: Diagnosis not present

## 2022-09-06 DIAGNOSIS — H40021 Open angle with borderline findings, high risk, right eye: Secondary | ICD-10-CM | POA: Diagnosis not present

## 2022-09-06 DIAGNOSIS — H401122 Primary open-angle glaucoma, left eye, moderate stage: Secondary | ICD-10-CM | POA: Diagnosis not present

## 2022-09-06 DIAGNOSIS — H40022 Open angle with borderline findings, high risk, left eye: Secondary | ICD-10-CM | POA: Diagnosis not present

## 2022-10-11 DIAGNOSIS — R748 Abnormal levels of other serum enzymes: Secondary | ICD-10-CM | POA: Diagnosis not present

## 2022-10-11 DIAGNOSIS — E610 Copper deficiency: Secondary | ICD-10-CM | POA: Diagnosis not present

## 2022-10-11 DIAGNOSIS — D472 Monoclonal gammopathy: Secondary | ICD-10-CM | POA: Diagnosis not present

## 2022-10-11 DIAGNOSIS — E538 Deficiency of other specified B group vitamins: Secondary | ICD-10-CM | POA: Diagnosis not present

## 2022-10-11 DIAGNOSIS — D61818 Other pancytopenia: Secondary | ICD-10-CM | POA: Diagnosis not present

## 2022-10-23 DIAGNOSIS — C61 Malignant neoplasm of prostate: Secondary | ICD-10-CM | POA: Diagnosis not present

## 2022-10-23 DIAGNOSIS — N3281 Overactive bladder: Secondary | ICD-10-CM | POA: Diagnosis not present

## 2022-11-28 ENCOUNTER — Ambulatory Visit (INDEPENDENT_AMBULATORY_CARE_PROVIDER_SITE_OTHER): Payer: Medicare HMO

## 2022-11-28 DIAGNOSIS — Z23 Encounter for immunization: Secondary | ICD-10-CM | POA: Diagnosis not present

## 2022-11-28 NOTE — Progress Notes (Signed)
Per orders of Nadene Rubins, MD, pt is here for HIGH DOSE FLU VACCINE. pt received VACCINE in LEFT DELTOID. Given by Rosine Abe . Pt tolerated VACCINE well.

## 2022-12-05 DIAGNOSIS — D2372 Other benign neoplasm of skin of left lower limb, including hip: Secondary | ICD-10-CM | POA: Diagnosis not present

## 2022-12-05 DIAGNOSIS — B078 Other viral warts: Secondary | ICD-10-CM | POA: Diagnosis not present

## 2022-12-05 DIAGNOSIS — D485 Neoplasm of uncertain behavior of skin: Secondary | ICD-10-CM | POA: Diagnosis not present

## 2022-12-05 DIAGNOSIS — D2272 Melanocytic nevi of left lower limb, including hip: Secondary | ICD-10-CM | POA: Diagnosis not present

## 2022-12-05 DIAGNOSIS — L821 Other seborrheic keratosis: Secondary | ICD-10-CM | POA: Diagnosis not present

## 2022-12-05 DIAGNOSIS — L905 Scar conditions and fibrosis of skin: Secondary | ICD-10-CM | POA: Diagnosis not present

## 2022-12-05 DIAGNOSIS — D225 Melanocytic nevi of trunk: Secondary | ICD-10-CM | POA: Diagnosis not present

## 2022-12-05 DIAGNOSIS — Z85828 Personal history of other malignant neoplasm of skin: Secondary | ICD-10-CM | POA: Diagnosis not present

## 2022-12-06 DIAGNOSIS — H43811 Vitreous degeneration, right eye: Secondary | ICD-10-CM | POA: Diagnosis not present

## 2022-12-06 DIAGNOSIS — H35373 Puckering of macula, bilateral: Secondary | ICD-10-CM | POA: Diagnosis not present

## 2022-12-06 DIAGNOSIS — H33022 Retinal detachment with multiple breaks, left eye: Secondary | ICD-10-CM | POA: Diagnosis not present

## 2022-12-06 DIAGNOSIS — H33311 Horseshoe tear of retina without detachment, right eye: Secondary | ICD-10-CM | POA: Diagnosis not present

## 2022-12-06 DIAGNOSIS — H353132 Nonexudative age-related macular degeneration, bilateral, intermediate dry stage: Secondary | ICD-10-CM | POA: Diagnosis not present

## 2022-12-06 DIAGNOSIS — H04123 Dry eye syndrome of bilateral lacrimal glands: Secondary | ICD-10-CM | POA: Diagnosis not present

## 2023-01-09 DIAGNOSIS — Z8669 Personal history of other diseases of the nervous system and sense organs: Secondary | ICD-10-CM | POA: Diagnosis not present

## 2023-01-09 DIAGNOSIS — Q142 Congenital malformation of optic disc: Secondary | ICD-10-CM | POA: Diagnosis not present

## 2023-01-09 DIAGNOSIS — Z961 Presence of intraocular lens: Secondary | ICD-10-CM | POA: Diagnosis not present

## 2023-01-09 DIAGNOSIS — H04123 Dry eye syndrome of bilateral lacrimal glands: Secondary | ICD-10-CM | POA: Diagnosis not present

## 2023-01-09 DIAGNOSIS — H40021 Open angle with borderline findings, high risk, right eye: Secondary | ICD-10-CM | POA: Diagnosis not present

## 2023-01-09 DIAGNOSIS — H40022 Open angle with borderline findings, high risk, left eye: Secondary | ICD-10-CM | POA: Diagnosis not present

## 2023-02-14 DIAGNOSIS — I4891 Unspecified atrial fibrillation: Secondary | ICD-10-CM | POA: Diagnosis not present

## 2023-02-14 DIAGNOSIS — I10 Essential (primary) hypertension: Secondary | ICD-10-CM | POA: Diagnosis not present

## 2023-02-14 DIAGNOSIS — I350 Nonrheumatic aortic (valve) stenosis: Secondary | ICD-10-CM | POA: Diagnosis not present

## 2023-02-14 DIAGNOSIS — I7121 Aneurysm of the ascending aorta, without rupture: Secondary | ICD-10-CM | POA: Diagnosis not present

## 2023-02-14 DIAGNOSIS — I9789 Other postprocedural complications and disorders of the circulatory system, not elsewhere classified: Secondary | ICD-10-CM | POA: Diagnosis not present

## 2023-02-14 DIAGNOSIS — E7800 Pure hypercholesterolemia, unspecified: Secondary | ICD-10-CM | POA: Diagnosis not present

## 2023-02-14 DIAGNOSIS — I2510 Atherosclerotic heart disease of native coronary artery without angina pectoris: Secondary | ICD-10-CM | POA: Diagnosis not present

## 2023-02-28 ENCOUNTER — Ambulatory Visit: Payer: Medicare HMO | Admitting: Family Medicine

## 2023-02-28 ENCOUNTER — Encounter: Payer: Self-pay | Admitting: Family Medicine

## 2023-02-28 VITALS — BP 118/78 | HR 82 | Temp 98.4°F | Ht 71.0 in | Wt 199.8 lb

## 2023-02-28 DIAGNOSIS — I10 Essential (primary) hypertension: Secondary | ICD-10-CM | POA: Diagnosis not present

## 2023-02-28 DIAGNOSIS — Z131 Encounter for screening for diabetes mellitus: Secondary | ICD-10-CM

## 2023-02-28 DIAGNOSIS — Z23 Encounter for immunization: Secondary | ICD-10-CM

## 2023-02-28 DIAGNOSIS — E7800 Pure hypercholesterolemia, unspecified: Secondary | ICD-10-CM

## 2023-02-28 LAB — BASIC METABOLIC PANEL
BUN: 18 mg/dL (ref 6–23)
CO2: 26 meq/L (ref 19–32)
Calcium: 9.5 mg/dL (ref 8.4–10.5)
Chloride: 104 meq/L (ref 96–112)
Creatinine, Ser: 1.06 mg/dL (ref 0.40–1.50)
GFR: 67.89 mL/min (ref >60.00)
Glucose, Bld: 124 mg/dL — ABNORMAL HIGH (ref 70–99)
Potassium: 4 meq/L (ref 3.5–5.1)
Sodium: 139 meq/L (ref 135–145)

## 2023-02-28 LAB — HEMOGLOBIN A1C: Hgb A1c MFr Bld: 6.5 % (ref 4.6–6.5)

## 2023-02-28 LAB — LDL CHOLESTEROL, DIRECT: Direct LDL: 44 mg/dL

## 2023-02-28 NOTE — Progress Notes (Signed)
Established Patient Office Visit   Subjective:  Patient ID: Jeffrey Stein, male    DOB: 1945/06/28  Age: 78 y.o. MRN: [id number]  Chief Complaint  Patient presents with   Medical Management of Chronic Issues    6 month follow up. Pt is not fasting. Has questions regarding Brimonidine.     HPI Encounter Diagnoses  Name Primary?   Elevated LDL cholesterol level Yes   Essential hypertension    Immunization due    Screening for diabetes mellitus    For follow-up of above.  Continues carvedilol and losartan for hypertension.  He is rosuvastatin for elevated cholesterol.  Developed syncope with brimonidine eyedrops used for glaucoma prevention.  He had to discontinue them.  Continues to exercise regularly   Review of Systems  Constitutional: Negative.   HENT: Negative.    Eyes:  Negative for blurred vision, discharge and redness.  Respiratory: Negative.    Cardiovascular: Negative.   Gastrointestinal:  Negative for abdominal pain.  Genitourinary: Negative.   Musculoskeletal: Negative.  Negative for myalgias.  Skin:  Negative for rash.  Neurological:  Negative for tingling, loss of consciousness and weakness.  Endo/Heme/Allergies:  Negative for polydipsia.     Current Outpatient Medications:    aspirin EC 81 MG tablet, Take 81 mg by mouth at bedtime. , Disp: , Rfl:    brimonidine (ALPHAGAN) 0.2 % ophthalmic solution, Place 1 drop into both eyes 3 (three) times daily., Disp: , Rfl:    carvedilol (COREG) 6.25 MG tablet, Take 1 tablet (6.25 mg total) by mouth 2 (two) times daily., Disp: 180 tablet, Rfl: 3   Copper Gluconate 2 MG CAPS, Take 1 capsule by mouth daily., Disp: , Rfl:    cyanocobalamin 1000 MCG tablet, Take by mouth., Disp: , Rfl:    fluticasone (FLONASE) 50 MCG/ACT nasal spray, Place 2 sprays into both nostrils daily., Disp: , Rfl:    losartan (COZAAR) 25 MG tablet, Take 25 mg by mouth daily., Disp: , Rfl:    Multiple Vitamins-Minerals (SYSTANE ICAPS AREDS2) TABS,  Take by mouth., Disp: , Rfl:    Polyethyl Glycol-Propyl Glycol (LUBRICANT EYE DROPS) 0.4-0.3 % SOLN, Place 1 drop into both eyes 3 (three) times daily as needed (dry/irritated eyes.)., Disp: , Rfl:    potassium chloride (KLOR-CON) 20 MEQ packet, Take 20 mEq by mouth once., Disp: , Rfl:    rosuvastatin (CRESTOR) 20 MG tablet, Take 20 mg by mouth every evening. , Disp: , Rfl:    trospium (SANCTURA) 20 MG tablet, Take 20 mg by mouth 2 (two) times daily., Disp: , Rfl:    vitamin E 400 UNIT capsule, Take 800 Units by mouth daily. , Disp: , Rfl:   Current Facility-Administered Medications:    0.9 %  sodium chloride infusion, 500 mL, Intravenous, Once, Danis, Starr Lake III, MD   Objective:     BP 118/78   Pulse 82   Temp 98.4 F (36.9 C)   Ht 5\' 11"  (1.803 m)   Wt 199 lb 12.8 oz (90.6 kg)   SpO2 96%   BMI 27.87 kg/m    Physical Exam Constitutional:      General: He is not in acute distress.    Appearance: Normal appearance. He is not ill-appearing, toxic-appearing or diaphoretic.  HENT:     Head: Normocephalic and atraumatic.     Right Ear: External ear normal.     Left Ear: External ear normal.     Mouth/Throat:     Mouth: Mucous membranes  are moist.     Pharynx: Oropharynx is clear. No oropharyngeal exudate or posterior oropharyngeal erythema.  Eyes:     General: No scleral icterus.       Right eye: No discharge.        Left eye: No discharge.     Extraocular Movements: Extraocular movements intact.     Conjunctiva/sclera: Conjunctivae normal.     Pupils: Pupils are equal, round, and reactive to light.  Cardiovascular:     Rate and Rhythm: Normal rate and regular rhythm.  Pulmonary:     Effort: Pulmonary effort is normal. No respiratory distress.     Breath sounds: Normal breath sounds. No wheezing or rales.  Musculoskeletal:     Cervical back: No rigidity or tenderness.  Skin:    General: Skin is warm and dry.  Neurological:     Mental Status: He is alert and oriented to  person, place, and time.  Psychiatric:        Mood and Affect: Mood normal.        Behavior: Behavior normal.      No results found for any visits on 02/28/23.    The ASCVD Risk score (Arnett DK, et al., 2019) failed to calculate for the following reasons:   The valid total cholesterol range is 130 to 320 mg/dL    Assessment & Plan:   Elevated LDL cholesterol level -     LDL cholesterol, direct  Essential hypertension -     Basic metabolic panel  Immunization due -     Tdap vaccine greater than or equal to 7yo IM  Screening for diabetes mellitus -     Hemoglobin A1c    Return in about 6 months (around 08/28/2023), or if symptoms worsen or fail to improve.    Mliss Sax, MD

## 2023-03-18 DIAGNOSIS — G43109 Migraine with aura, not intractable, without status migrainosus: Secondary | ICD-10-CM | POA: Diagnosis not present

## 2023-03-18 DIAGNOSIS — Z961 Presence of intraocular lens: Secondary | ICD-10-CM | POA: Diagnosis not present

## 2023-03-18 DIAGNOSIS — H5340 Unspecified visual field defects: Secondary | ICD-10-CM | POA: Diagnosis not present

## 2023-03-18 DIAGNOSIS — H40022 Open angle with borderline findings, high risk, left eye: Secondary | ICD-10-CM | POA: Diagnosis not present

## 2023-03-18 DIAGNOSIS — H40021 Open angle with borderline findings, high risk, right eye: Secondary | ICD-10-CM | POA: Diagnosis not present

## 2023-03-25 ENCOUNTER — Ambulatory Visit (INDEPENDENT_AMBULATORY_CARE_PROVIDER_SITE_OTHER): Payer: Medicare HMO

## 2023-03-25 DIAGNOSIS — Z0000 Encounter for general adult medical examination without abnormal findings: Secondary | ICD-10-CM | POA: Diagnosis not present

## 2023-03-25 NOTE — Patient Instructions (Signed)
 Jeffrey Stein , Thank you for taking time to come for your Medicare Wellness Visit. I appreciate your ongoing commitment to your health goals. Please review the following plan we discussed and let me know if I can assist you in the future.   Referrals/Orders/Follow-Ups/Clinician Recommendations: none  This is a list of the screening recommended for you and due dates:  Health Maintenance  Topic Date Due   Medicare Annual Wellness Visit  03/24/2024   Colon Cancer Screening  02/19/2025   DTaP/Tdap/Td vaccine (2 - Td or Tdap) 02/27/2033   Pneumonia Vaccine  Completed   Flu Shot  Completed   Hepatitis C Screening  Completed   Zoster (Shingles) Vaccine  Completed   HPV Vaccine  Aged Out   COVID-19 Vaccine  Discontinued    Advanced directives: (Copy Requested) Please bring a copy of your health care power of attorney and living will to the office to be added to your chart at your convenience.  Next Medicare Annual Wellness Visit scheduled for next year: Yes  insert Preventive Care attachment Insert FALL PREVENTION attachment if needed

## 2023-03-25 NOTE — Progress Notes (Signed)
 Subjective:   Jeffrey Stein is a 78 y.o. male who presents for Medicare Annual/Subsequent preventive examination.  Visit Complete: Virtual I connected with  Jeffrey Stein on 03/25/23 by a audio enabled telemedicine application and verified that I am speaking with the correct person using two identifiers. Interactive audio and video telecommunications were attempted between this provider and patient, however failed, due to patient having technical difficulties OR patient did not have access to video capability.  We continued and completed visit with audio only.  Patient Location: Home  Provider Location: Office/Clinic  I discussed the limitations of evaluation and management by telemedicine. The patient expressed understanding and agreed to proceed.  Vital Signs: Because this visit was a virtual/telehealth visit, some criteria may be missing or patient reported. Any vitals not documented were not able to be obtained and vitals that have been documented are patient reported.    Cardiac Risk Factors include: advanced age (>56men, >84 women);dyslipidemia;hypertension;male gender     Objective:    Today's Vitals   There is no height or weight on file to calculate BMI.     03/25/2023    2:32 PM 03/19/2022    2:43 PM 04/05/2020    8:23 AM 10/26/2019    8:12 AM 10/08/2018   11:03 AM 09/23/2018    7:24 AM 09/22/2018   10:39 AM  Advanced Directives  Does Patient Have a Medical Advance Directive? Yes Yes Yes Yes Yes Yes Yes  Type of Estate agent of [place];Living will Healthcare Power of [place];Living will Healthcare Power of [place];Living will Healthcare Power of [place];Living will Healthcare Power of [place];Living will Healthcare Power of [employer] of [place];Living will  Does patient want to make changes to medical advance directive?     No - Patient declined    Copy of Healthcare Power of Attorney in Chart? No - copy requested No - copy  requested No - copy requested No - copy requested Yes - validated most recent copy scanned in chart (See row information) No - copy requested     Current Medications (verified) Outpatient Encounter Medications as of 03/25/2023  Medication Sig   aspirin EC 81 MG tablet Take 81 mg by mouth at bedtime.    carvedilol (COREG) 6.25 MG tablet Take 1 tablet (6.25 mg total) by mouth 2 (two) times daily.   Copper Gluconate 2 MG CAPS Take 1 capsule by mouth daily.   cyanocobalamin 1000 MCG tablet Take by mouth.   fluticasone (FLONASE) 50 MCG/ACT nasal spray Place 2 sprays into both nostrils daily.   losartan (COZAAR) 25 MG tablet Take 25 mg by mouth daily.   Multiple Vitamins-Minerals (SYSTANE ICAPS AREDS2) TABS Take by mouth.   Polyethyl Glycol-Propyl Glycol (LUBRICANT EYE DROPS) 0.4-0.3 % SOLN Place 1 drop into both eyes 3 (three) times daily as needed (dry/irritated eyes.).   potassium chloride (KLOR-CON) 20 MEQ packet Take 20 mEq by mouth once.   rosuvastatin (CRESTOR) 20 MG tablet Take 20 mg by mouth every evening.    trospium (SANCTURA) 20 MG tablet Take 20 mg by mouth 2 (two) times daily.   vitamin E 400 UNIT capsule Take 800 Units by mouth daily.    brimonidine (ALPHAGAN) 0.2 % ophthalmic solution Place 1 drop into both eyes 3 (three) times daily. (Patient not taking: Reported on 03/25/2023)   Facility-Administered Encounter Medications as of 03/25/2023  Medication   0.9 %  sodium chloride infusion    Allergies (verified) Penicillin g, Atorvastatin, Iodinated contrast media, Omeprazole,  and Shellfish allergy   History: Past Medical History:  Diagnosis Date   Abnormal liver enzymes    Allergic rhinitis    Allergy    seasonal and environmental   CAD (coronary artery disease)    Cataract    Colon polyps    Diverticulosis    ED (erectile dysfunction)    GERD (gastroesophageal reflux disease)    Heart murmur    20 yrs ago   Hemochromatosis    HTN (hypertension)    Hyperlipemia     Meniere's disease    Prostate cancer (HCC)    Retinal detachment    Past Surgical History:  Procedure Laterality Date   APPENDECTOMY     BASAL CELL CARCINOMA EXCISION  2014   behind right knee and back   cataracts      COLONOSCOPY  2020   COLONOSCOPY WITH PROPOFOL N/A 09/23/2018   Procedure: COLONOSCOPY WITH PROPOFOL;  Surgeon: Sherrilyn Rist, MD;  Location: [facility];  Service: Gastroenterology;  Laterality: N/A;   COLONOSCOPY WITH PROPOFOL N/A 10/26/2019   Procedure: COLONOSCOPY WITH PROPOFOL;  Surgeon: Sherrilyn Rist, MD;  Location: [facility];  Service: Gastroenterology;  Laterality: N/A;   HEMOSTASIS CLIP PLACEMENT  09/23/2018   Procedure: HEMOSTASIS CLIP PLACEMENT;  Surgeon: Sherrilyn Rist, MD;  Location: [facility];  Service: Gastroenterology;;   HOT HEMOSTASIS N/A 09/23/2018   Procedure: HOT HEMOSTASIS (ARGON PLASMA COAGULATION/BICAP);  Surgeon: Sherrilyn Rist, MD;  Location: [facility];  Service: Gastroenterology;  Laterality: N/A;   HOT HEMOSTASIS N/A 10/26/2019   Procedure: HOT HEMOSTASIS (ARGON PLASMA COAGULATION/BICAP);  Surgeon: Sherrilyn Rist, MD;  Location: [facility];  Service: Gastroenterology;  Laterality: N/A;   POLYPECTOMY     POLYPECTOMY  09/23/2018   Procedure: POLYPECTOMY;  Surgeon: Sherrilyn Rist, MD;  Location: [facility];  Service: Gastroenterology;;   POLYPECTOMY  10/26/2019   Procedure: POLYPECTOMY;  Surgeon: Sherrilyn Rist, MD;  Location: [facility];  Service: Gastroenterology;;   PROSTATECTOMY     RETINAL DETACHMENT SURGERY     SUBMUCOSAL LIFTING INJECTION  09/23/2018   Procedure: SUBMUCOSAL LIFTING INJECTION;  Surgeon: Sherrilyn Rist, MD;  Location: [facility];  Service: Gastroenterology;;   tripple bypass N/A 12/07/2015   Family History  Problem Relation Age of Onset   Colon cancer Mother    Emphysema Father    Colon cancer Brother    Stomach cancer Neg Hx    Pancreatic cancer Neg Hx    Esophageal cancer  Neg Hx    Rectal cancer Neg Hx    Colon polyps Neg Hx    Social History   Socioeconomic History   Marital status: Married    Spouse name: Mary   Number of children: Not on file   Years of education: Not on file   Highest education level: Not on file  Occupational History   Not on file  Tobacco Use   Smoking status: Never   Smokeless tobacco: Never  Vaping Use   Vaping status: Never Used  Substance and Sexual Activity   Alcohol use: Yes    Comment: 2 cocktails per day    Drug use: No   Sexual activity: Not on file  Other Topics Concern   Not on file  Social History Narrative   Not on file   Social Drivers of Health   Financial Resource Strain: Low Risk  (03/25/2023)   Overall Financial Resource Strain (CARDIA)  Difficulty of Paying Living Expenses: Not hard at all  Food Insecurity: No Food Insecurity (03/25/2023)   Hunger Vital Sign    Worried About Running Out of Food in the Last Year: Never true    Ran Out of Food in the Last Year: Never true  Transportation Needs: No Transportation Needs (03/25/2023)   PRAPARE - Administrator, Civil Service (Medical): No    Lack of Transportation (Non-Medical): No  Physical Activity: Insufficiently Active (03/25/2023)   Exercise Vital Sign    Days of Exercise per Week: 2 days    Minutes of Exercise per Session: 30 min  Stress: No Stress Concern Present (03/25/2023)   [employer] of Occupational Health - Occupational Stress Questionnaire    Feeling of Stress : Not at all  Social Connections: Socially Integrated (03/25/2023)   Social Connection and Isolation Panel [NHANES]    Frequency of Communication with Friends and Family: Twice a week    Frequency of Social Gatherings with Friends and Family: Twice a week    Attends Religious Services: More than 4 times per year    Active Member of [employer] or Organizations: Yes    Attends Engineer, structural: More than 4 times per year    Marital Status: Married     Tobacco Counseling Counseling given: Not Answered   Clinical Intake:  Pre-visit preparation completed: Yes  Pain : No/denies pain     Nutritional Risks: None Diabetes: No  How often do you need to have someone help you when you read instructions, pamphlets, or other written materials from your doctor or pharmacy?: 1 - Never  Interpreter Needed?: No  Information entered by :: NAllen LPN   Activities of Daily Living    03/25/2023    2:27 PM  In your present state of health, do you have any difficulty performing the following activities:  Hearing? 1  Comment has hearing aids  Vision? 0  Difficulty concentrating or making decisions? 0  Walking or climbing stairs? 0  Dressing or bathing? 0  Doing errands, shopping? 0  Preparing Food and eating ? N  Using the Toilet? N  In the past six months, have you accidently leaked urine? Y  Comment prostate surgery  Do you have problems with loss of bowel control? N  Managing your Medications? N  Managing your Finances? N  Housekeeping or managing your Housekeeping? N    Patient Care Team: Mliss Sax, MD as PCP - General (Family Medicine)  Indicate any recent Medical Services you may have received from other than [facility] providers in the past year (date may be approximate).     Assessment:   This is a routine wellness examination for Demarion.  Hearing/Vision screen Hearing Screening - Comments:: Has hearing aids that are maintained Vision Screening - Comments:: Regular eye exams, [facility]   Goals Addressed             This Visit's Progress    Patient Stated       03/25/2023, stay healthy       Depression Screen    03/25/2023    2:34 PM 08/24/2022    8:32 AM 03/19/2022    2:44 PM 06/30/2021    9:50 AM 06/30/2021    9:02 AM 09/20/2020    1:26 PM 04/05/2020    8:24 AM  PHQ 2/9 Scores  PHQ - 2 Score 0 0 0 0 0 0 0  PHQ- 9 Score 0   3  Fall Risk    03/25/2023    2:33 PM 08/24/2022    8:32 AM  03/19/2022    2:44 PM 06/30/2021    9:02 AM 09/20/2020    1:26 PM  Fall Risk   Falls in the past year? 1 0 0 0 0  Comment bp dropping      Number falls in past yr: 1 0 0 0   Injury with Fall? 0 0 0    Risk for fall due to : Medication side effect No Fall Risks Medication side effect    Follow up Falls prevention discussed;Falls evaluation completed Falls evaluation completed Falls prevention discussed;Education provided;Falls evaluation completed      MEDICARE RISK AT HOME: Medicare Risk at Home Any stairs in or around the home?: Yes If so, are there any without handrails?: No Home free of loose throw rugs in walkways, pet beds, electrical cords, etc?: Yes Adequate lighting in your home to reduce risk of falls?: Yes Life alert?: No Use of a cane, walker or w/c?: No Grab bars in the bathroom?: Yes Shower chair or bench in shower?: Yes Elevated toilet seat or a handicapped toilet?: Yes  TIMED UP AND GO:  Was the test performed?  No    Cognitive Function:        03/25/2023    2:35 PM 03/19/2022    2:45 PM 10/08/2018   11:12 AM  6CIT Screen  What Year? 0 points 0 points 0 points  What month? 0 points 0 points 0 points  What time? 0 points 0 points 0 points  Count back from 20 0 points 0 points 0 points  Months in reverse 0 points 0 points 0 points  Repeat phrase 2 points 0 points 0 points  Total Score 2 points 0 points 0 points    Immunizations Immunization History  Administered Date(s) Administered   Fluad Quad(high Dose 65+) 10/15/2018, 10/26/2021   Fluad Trivalent(High Dose 65+) 11/28/2022   Influenza, High Dose Seasonal PF 10/30/2016, 11/25/2017   Influenza-Unspecified 11/19/2016, 11/06/2019   Moderna Sars-Covid-2 Vaccination 08/05/2020, 10/29/2021   PFIZER Comirnaty(Gray Top)Covid-19 Tri-Sucrose Vaccine 11/06/2019   PFIZER(Purple Top)SARS-COV-2 Vaccination 03/01/2019, 03/23/2019, 11/03/2019   Pneumococcal Conjugate-13 12/10/2014   Pneumococcal Polysaccharide-23  08/20/2011   Tdap 02/28/2023   Zoster Recombinant(Shingrix) 09/16/2017, 11/25/2017    TDAP status: Up to date  Flu Vaccine status: Up to date  Pneumococcal vaccine status: Up to date  Covid-19 vaccine status: Information provided on how to obtain vaccines.   Qualifies for Shingles Vaccine? Yes   Zostavax completed Yes   Shingrix Completed?: Yes  Screening Tests Health Maintenance  Topic Date Due   Medicare Annual Wellness (AWV)  03/24/2024   Colonoscopy  02/19/2025   DTaP/Tdap/Td (2 - Td or Tdap) 02/27/2033   Pneumonia Vaccine 60+ Years old  Completed   INFLUENZA VACCINE  Completed   Hepatitis C Screening  Completed   Zoster Vaccines- Shingrix  Completed   HPV VACCINES  Aged Out   COVID-19 Vaccine  Discontinued    Health Maintenance  There are no preventive care reminders to display for this patient.   Colorectal cancer screening: No longer required.   Lung Cancer Screening: (Low Dose CT Chest recommended if Age 30-80 years, 20 pack-year currently smoking OR have quit w/in 15years.) does not qualify.   Lung Cancer Screening Referral: no  Additional Screening:  Hepatitis C Screening: does qualify; Completed 10/15/2018  Vision Screening: Recommended annual ophthalmology exams for early detection of glaucoma and other disorders  of the eye. Is the patient up to date with their annual eye exam?  Yes  Who is the provider or what is the name of the office in which the patient attends annual eye exams? [facility] If pt is not established with a provider, would they like to be referred to a provider to establish care? No .   Dental Screening: Recommended annual dental exams for proper oral hygiene  Diabetic Foot Exam: n/a  Community Resource Referral / Chronic Care Management: CRR required this visit?  No   CCM required this visit?  No     Plan:     I have personally reviewed and noted the following in the patient's chart:   Medical and social history Use of  alcohol, tobacco or illicit drugs  Current medications and supplements including opioid prescriptions. Patient is not currently taking opioid prescriptions. Functional ability and status Nutritional status Physical activity Advanced directives List of other physicians Hospitalizations, surgeries, and ER visits in previous 12 months Vitals Screenings to include cognitive, depression, and falls Referrals and appointments  In addition, I have reviewed and discussed with patient certain preventive protocols, quality metrics, and best practice recommendations. A written personalized care plan for preventive services as well as general preventive health recommendations were provided to patient.     Barb Merino, LPN   05/14/8117   After Visit Summary: (MyChart) Due to this being a telephonic visit, the after visit summary with patients personalized plan was offered to patient via MyChart   Nurse Notes: none

## 2023-03-27 ENCOUNTER — Telehealth: Payer: Self-pay | Admitting: Gastroenterology

## 2023-03-27 NOTE — Telephone Encounter (Signed)
 Returned call to patient, he reports not remembering about his colonoscopy. He did see where it was noted on his calendar and he called his insurance and he was billed. I confirmed with patient that he did have colonoscopy on 02/19/22 and it was recommended that he have a 3-year recall. I sent patient a copy of the colonoscopy report in MyChart message. Pt is aware that he will receive a recall letter when he is due. Pt will let us know if he has any questions after reviewing report.

## 2023-03-27 NOTE — Telephone Encounter (Signed)
 Patient called and stated that he would like to speak to the nurse or Dr. Myrtie Neither regarding his last colonoscopy. Patient was advise that he was next colonoscopy would be in 02/27/2025. Patient was also advised that her had a procedure last year. Patient would like to discuss that procedure date. Patient is requesting a call back. Please advise.

## 2023-04-15 DIAGNOSIS — C61 Malignant neoplasm of prostate: Secondary | ICD-10-CM | POA: Diagnosis not present

## 2023-04-22 DIAGNOSIS — H40022 Open angle with borderline findings, high risk, left eye: Secondary | ICD-10-CM | POA: Diagnosis not present

## 2023-04-22 DIAGNOSIS — G43109 Migraine with aura, not intractable, without status migrainosus: Secondary | ICD-10-CM | POA: Diagnosis not present

## 2023-04-22 DIAGNOSIS — H5340 Unspecified visual field defects: Secondary | ICD-10-CM | POA: Diagnosis not present

## 2023-05-20 DIAGNOSIS — H524 Presbyopia: Secondary | ICD-10-CM | POA: Diagnosis not present

## 2023-05-20 DIAGNOSIS — H5213 Myopia, bilateral: Secondary | ICD-10-CM | POA: Diagnosis not present

## 2023-05-20 DIAGNOSIS — H52223 Regular astigmatism, bilateral: Secondary | ICD-10-CM | POA: Diagnosis not present

## 2023-05-31 DIAGNOSIS — M545 Low back pain, unspecified: Secondary | ICD-10-CM | POA: Diagnosis not present

## 2023-06-03 ENCOUNTER — Ambulatory Visit: Attending: Orthopaedic Surgery | Admitting: Physical Therapy

## 2023-06-03 ENCOUNTER — Encounter: Payer: Self-pay | Admitting: Physical Therapy

## 2023-06-03 ENCOUNTER — Other Ambulatory Visit: Payer: Self-pay

## 2023-06-03 DIAGNOSIS — M5459 Other low back pain: Secondary | ICD-10-CM | POA: Insufficient documentation

## 2023-06-03 DIAGNOSIS — R2681 Unsteadiness on feet: Secondary | ICD-10-CM | POA: Diagnosis not present

## 2023-06-03 DIAGNOSIS — R29898 Other symptoms and signs involving the musculoskeletal system: Secondary | ICD-10-CM | POA: Diagnosis not present

## 2023-06-03 DIAGNOSIS — M6281 Muscle weakness (generalized): Secondary | ICD-10-CM | POA: Insufficient documentation

## 2023-06-03 NOTE — Therapy (Signed)
 OUTPATIENT PHYSICAL THERAPY THORACOLUMBAR EVALUATION   Patient Name: Baby Sauceman MRN: [id number] DOB:02/23/1945, 78 y.o., male Today's Date: 06/03/2023  END OF SESSION:  PT End of Session - 06/03/23 1436     Visit Number 1    Number of Visits 13    Date for PT Re-Evaluation 07/15/23    Authorization Type [employer] MCR    Authorization Time Period 06/03/23 to 07/29/23    Progress Note Due on Visit 10    PT Start Time 1348    PT Stop Time 1427    PT Time Calculation (min) 39 min    Activity Tolerance Patient tolerated treatment well    Behavior During Therapy [facility] for tasks assessed/performed             Past Medical History:  Diagnosis Date   Abnormal liver enzymes    Allergic rhinitis    Allergy    seasonal and environmental   CAD (coronary artery disease)    Cataract    Colon polyps    Diverticulosis    ED (erectile dysfunction)    GERD (gastroesophageal reflux disease)    Heart murmur    20 yrs ago   Hemochromatosis    HTN (hypertension)    Hyperlipemia    Meniere's disease    Prostate cancer (HCC)    Retinal detachment    Past Surgical History:  Procedure Laterality Date   APPENDECTOMY     BASAL CELL CARCINOMA EXCISION  2014   behind right knee and back   cataracts      COLONOSCOPY  2020   COLONOSCOPY WITH PROPOFOL  N/A 09/23/2018   Procedure: COLONOSCOPY WITH PROPOFOL ;  Surgeon: Albertina Hugger, MD;  Location: [facility];  Service: Gastroenterology;  Laterality: N/A;   COLONOSCOPY WITH PROPOFOL  N/A 10/26/2019   Procedure: COLONOSCOPY WITH PROPOFOL ;  Surgeon: Albertina Hugger, MD;  Location: [facility];  Service: Gastroenterology;  Laterality: N/A;   HEMOSTASIS CLIP PLACEMENT  09/23/2018   Procedure: HEMOSTASIS CLIP PLACEMENT;  Surgeon: Albertina Hugger, MD;  Location: [facility];  Service: Gastroenterology;;   HOT HEMOSTASIS N/A 09/23/2018   Procedure: HOT HEMOSTASIS (ARGON PLASMA COAGULATION/BICAP);  Surgeon: Albertina Hugger, MD;   Location: [facility];  Service: Gastroenterology;  Laterality: N/A;   HOT HEMOSTASIS N/A 10/26/2019   Procedure: HOT HEMOSTASIS (ARGON PLASMA COAGULATION/BICAP);  Surgeon: Albertina Hugger, MD;  Location: [facility];  Service: Gastroenterology;  Laterality: N/A;   POLYPECTOMY     POLYPECTOMY  09/23/2018   Procedure: POLYPECTOMY;  Surgeon: Albertina Hugger, MD;  Location: [facility];  Service: Gastroenterology;;   POLYPECTOMY  10/26/2019   Procedure: POLYPECTOMY;  Surgeon: Albertina Hugger, MD;  Location: [facility];  Service: Gastroenterology;;   PROSTATECTOMY     RETINAL DETACHMENT SURGERY     SUBMUCOSAL LIFTING INJECTION  09/23/2018   Procedure: SUBMUCOSAL LIFTING INJECTION;  Surgeon: Albertina Hugger, MD;  Location: [facility];  Service: Gastroenterology;;   tripple bypass N/A 12/07/2015   Patient Active Problem List   Diagnosis Date Noted   B12 deficiency 06/30/2021   Healthcare maintenance 06/30/2021   Hypotension due to drugs 11/24/2019   Elevated LDL cholesterol level 11/24/2019   History of colonic polyps    Cecal polyp    Motion sickness 09/16/2017   Primary insomnia 04/29/2017   Eczema 04/29/2017   Colon polyp 10/30/2016   Statin intolerance 04/26/2015   Hyperlipidemia 09/24/2014   Aortic stenosis 09/24/2014   Essential  hypertension 03/09/2013   CA of prostate (HCC) 02/13/2013   Abnormal prostate specific antigen 09/13/2011    PCP: Tonna Frederic MD   REFERRING PROVIDER: Dayne Even, MD  REFERRING DIAG: LBP   Rationale for Evaluation and Treatment: Rehabilitation  THERAPY DIAG:  Other low back pain  Muscle weakness (generalized)  Other symptoms and signs involving the musculoskeletal system  Unsteadiness on feet  ONSET DATE: chronic- years   SUBJECTIVE:                                                                                                                                                                                            SUBJECTIVE STATEMENT:  My back will start to hurt after standing for about 5 minutes or more. I was walking the other day and noticed that I also had a pain in the back of my thigh. Felt that same pain in the back of my thigh again yesterday. Have some pain up at the top of my back too. Leg/upper back/lower back pain all feel like separate issues, no shooting pains or sciatica that I know of. PA put me on Meloxicam recently. I've had this for years but it comes on faster now. I've been pretty sedentary but trying to be more active, started walking.   PERTINENT HISTORY:  See above   PAIN:  Are you having pain? No 0/10 now, at worst 4/10 can be a dull ache that's uncomfortable, goes away with seated rest   PRECAUTIONS: None  RED FLAGS: None   WEIGHT BEARING RESTRICTIONS: No  FALLS:  Has patient fallen in last 6 months? Yes. Number of falls 1- not sure exactly what happened, have had other falls in the past due to issues with HTN meds   LIVING ENVIRONMENT: Lives with: lives with their spouse Lives in: House/apartment   OCCUPATION: retired  PLOF: Independent, Independent with basic ADLs, Independent with gait, and Independent with transfers  PATIENT GOALS: get rid of pain, learn how to manage it   NEXT MD VISIT: Referring in 6 weeks   OBJECTIVE:  Note: Objective measures were completed at Evaluation unless otherwise noted.   PATIENT SURVEYS:   Patient-Specific Activity Scoring Scheme  "0" represents "unable to perform." "10" represents "able to perform at prior level. 0 1 2 3 4 5 6 7 8 9  10 (Date and Score)   Activity Eval     1. Standing for >10 minutes  3    2. Walking longer distances   8    3.  Balance skills  7   4.    5.    Score 6  Total score = sum of the activity scores/number of activities Minimum detectable change (90%CI) for average score = 2 points Minimum detectable change (90%CI) for single activity score = 3  points     COGNITION: Overall cognitive status: Within functional limits for tasks assessed      MUSCLE LENGTH:  HS severe limitation B, piriformis mild limitation B, hip flexors mod limitation B     POSTURE: rounded shoulders and forward head  PALPATION:  Low back very tight, multiple mm spasms noted   LUMBAR ROM:   AROM eval  Flexion Severe limitation   Extension Moderate limitation  Right lateral flexion WNL   Left lateral flexion WNL  Right rotation WNL   Left rotation WNL    (Blank rows = not tested)   LOWER EXTREMITY MMT:    MMT Right eval Left eval  Hip flexion 4 4  Hip extension    Hip abduction 4+ 4+  Hip adduction    Hip internal rotation    Hip external rotation    Knee flexion 4- 4-  Knee extension 4 4  Ankle dorsiflexion    Ankle plantarflexion    Ankle inversion    Ankle eversion     (Blank rows = not tested)  LUMBAR SPECIAL TESTS:  Straight leg raise test: Negative  FUNCTIONAL TESTS:  Dynamic Gait Index: 21/24     TREATMENT DATE:   06/03/23  Eval, HEP, POC                     HEP practice as below                                                                                                                PATIENT EDUCATION:  Education details: exam findings, POC, HEP  Person educated: Patient Education method: Explanation, Demonstration, and Handouts Education comprehension: verbalized understanding, returned demonstration, and needs further education  HOME EXERCISE PROGRAM: Access Code: [email or web address] URL: [email or web address] Date: 06/03/2023 Prepared by: Terrel Ferries  Exercises - Supine Single Knee to Chest Stretch  - 2 x daily - 7 x weekly - 1 sets - 5 reps - 5 seconds  hold - Supine Lower Trunk Rotation  - 2 x daily - 7 x weekly - 1 sets - 5 reps - 5 seconds  hold - Hooklying Hamstring Stretch with Strap  - 2 x daily - 7 x weekly - 1 sets - 3 reps - 30 seconds  hold - Supine Transversus Abdominis  Bracing - Hands on Stomach  - 5 x daily - 7 x weekly - 1 sets - 10 reps - 3 seconds  hold  ASSESSMENT:  CLINICAL IMPRESSION: Patient is a 78 y.o. M who was seen today for physical therapy evaluation and treatment for LBP. Objectives as above, I think that a lot of his symptoms are related to general deconditioning, joint stiffness, and mm tightness related to sedentary lifestyle. He is mildly unsteady but not too bad. Anticipate that he will benefit from skilled PT services  moving forward.    OBJECTIVE IMPAIRMENTS: decreased activity tolerance, decreased balance, decreased mobility, difficulty walking, decreased ROM, decreased strength, increased fascial restrictions, increased muscle spasms, impaired flexibility, postural dysfunction, and pain.   ACTIVITY LIMITATIONS: carrying, lifting, standing, squatting, transfers, bed mobility, and locomotion level  PARTICIPATION LIMITATIONS: meal prep, cleaning, shopping, community activity, and yard work  PERSONAL FACTORS: Age, Education, Fitness, Past/current experiences, and Time since onset of injury/illness/exacerbation are also affecting patient's functional outcome.   REHAB POTENTIAL: Good  CLINICAL DECISION MAKING: Stable/uncomplicated  EVALUATION COMPLEXITY: Low   GOALS: Goals reviewed with patient? No  SHORT TERM GOALS: Target date: 06/24/2023    Will be compliant with appropriate progressive HEP GOAL STATUS: Initial   2. Will demonstrate improved postural awareness with all functional tasks, use of ergonomic aides PRN/as desired GOAL STATUS: Initial   3. Will demonstrate good functional biomechanics for bed mobility and floor to waist lifting mechanics  GOAL STATUS: Initial   4. Lumbar flexion and extension AROM to be no more than 25% limited  GOAL STATUS: Initial    LONG TERM GOALS: Target date: 07/15/2023    MMT to have improved by one grade all weak groups GOAL STATUS: Initial  2. Mm flexibility and spasms to have  improved by at least 50% in order to improve functional movement patterns and for pain control GOAL STATUS: Initial  3. Pain to be no more than 1/10 at worst with all functional activities GOAL STATUS: Initial   4. Will have improved score on PSFS by at least 2 points to show improved QOL and subjective perception of condition  GOAL STATUS: Initial    PLAN:  PT FREQUENCY: 2x/week  PT DURATION: 6 weeks  PLANNED INTERVENTIONS: 97750- Physical Performance Testing, 97110-Therapeutic exercises, 97530- Therapeutic activity, [id number]- Neuromuscular re-education, 97535- Self Care, 82956- Manual therapy, [id number]- Aquatic Therapy, 97035- Ultrasound, 21308- Ionotophoresis 4mg /ml Dexamethasone , Taping, and Dry Needling.  PLAN FOR NEXT SESSION: hip and LE stretching/mobility, core strength, manual/DN as desired   Terrel Ferries, PT, DPT 06/03/23 2:42 PM

## 2023-06-05 ENCOUNTER — Ambulatory Visit: Admitting: Physical Therapy

## 2023-06-05 ENCOUNTER — Encounter: Payer: Self-pay | Admitting: Physical Therapy

## 2023-06-05 DIAGNOSIS — R2681 Unsteadiness on feet: Secondary | ICD-10-CM | POA: Diagnosis not present

## 2023-06-05 DIAGNOSIS — M5459 Other low back pain: Secondary | ICD-10-CM | POA: Diagnosis not present

## 2023-06-05 DIAGNOSIS — R29898 Other symptoms and signs involving the musculoskeletal system: Secondary | ICD-10-CM

## 2023-06-05 DIAGNOSIS — M6281 Muscle weakness (generalized): Secondary | ICD-10-CM | POA: Diagnosis not present

## 2023-06-05 NOTE — Therapy (Signed)
 OUTPATIENT PHYSICAL THERAPY THORACOLUMBAR TREATMENT    Patient Name: Jeffrey Stein MRN: [id number] DOB:1945/03/04, 78 y.o., male Today's Date: 06/05/2023  END OF SESSION:  PT End of Session - 06/05/23 1443     Visit Number 2    Number of Visits 13    Date for PT Re-Evaluation 07/15/23    Authorization Type [employer] MCR    Authorization Time Period 06/03/23 to 07/29/23    Progress Note Due on Visit 10    PT Start Time 1433    PT Stop Time 1513    PT Time Calculation (min) 40 min    Activity Tolerance Patient tolerated treatment well    Behavior During Therapy [facility] for tasks assessed/performed              Past Medical History:  Diagnosis Date   Abnormal liver enzymes    Allergic rhinitis    Allergy    seasonal and environmental   CAD (coronary artery disease)    Cataract    Colon polyps    Diverticulosis    ED (erectile dysfunction)    GERD (gastroesophageal reflux disease)    Heart murmur    20 yrs ago   Hemochromatosis    HTN (hypertension)    Hyperlipemia    Meniere's disease    Prostate cancer (HCC)    Retinal detachment    Past Surgical History:  Procedure Laterality Date   APPENDECTOMY     BASAL CELL CARCINOMA EXCISION  2014   behind right knee and back   cataracts      COLONOSCOPY  2020   COLONOSCOPY WITH PROPOFOL  N/A 09/23/2018   Procedure: COLONOSCOPY WITH PROPOFOL ;  Surgeon: Albertina Hugger, MD;  Location: [facility];  Service: Gastroenterology;  Laterality: N/A;   COLONOSCOPY WITH PROPOFOL  N/A 10/26/2019   Procedure: COLONOSCOPY WITH PROPOFOL ;  Surgeon: Albertina Hugger, MD;  Location: [facility];  Service: Gastroenterology;  Laterality: N/A;   HEMOSTASIS CLIP PLACEMENT  09/23/2018   Procedure: HEMOSTASIS CLIP PLACEMENT;  Surgeon: Albertina Hugger, MD;  Location: [facility];  Service: Gastroenterology;;   HOT HEMOSTASIS N/A 09/23/2018   Procedure: HOT HEMOSTASIS (ARGON PLASMA COAGULATION/BICAP);  Surgeon: Albertina Hugger, MD;   Location: [facility];  Service: Gastroenterology;  Laterality: N/A;   HOT HEMOSTASIS N/A 10/26/2019   Procedure: HOT HEMOSTASIS (ARGON PLASMA COAGULATION/BICAP);  Surgeon: Albertina Hugger, MD;  Location: [facility];  Service: Gastroenterology;  Laterality: N/A;   POLYPECTOMY     POLYPECTOMY  09/23/2018   Procedure: POLYPECTOMY;  Surgeon: Albertina Hugger, MD;  Location: [facility];  Service: Gastroenterology;;   POLYPECTOMY  10/26/2019   Procedure: POLYPECTOMY;  Surgeon: Albertina Hugger, MD;  Location: [facility];  Service: Gastroenterology;;   PROSTATECTOMY     RETINAL DETACHMENT SURGERY     SUBMUCOSAL LIFTING INJECTION  09/23/2018   Procedure: SUBMUCOSAL LIFTING INJECTION;  Surgeon: Albertina Hugger, MD;  Location: [facility];  Service: Gastroenterology;;   tripple bypass N/A 12/07/2015   Patient Active Problem List   Diagnosis Date Noted   B12 deficiency 06/30/2021   Healthcare maintenance 06/30/2021   Hypotension due to drugs 11/24/2019   Elevated LDL cholesterol level 11/24/2019   History of colonic polyps    Cecal polyp    Motion sickness 09/16/2017   Primary insomnia 04/29/2017   Eczema 04/29/2017   Colon polyp 10/30/2016   Statin intolerance 04/26/2015   Hyperlipidemia 09/24/2014   Aortic stenosis 09/24/2014  Essential hypertension 03/09/2013   CA of prostate (HCC) 02/13/2013   Abnormal prostate specific antigen 09/13/2011    PCP: Tonna Frederic MD   REFERRING PROVIDER: Dayne Even, MD  REFERRING DIAG: LBP   Rationale for Evaluation and Treatment: Rehabilitation  THERAPY DIAG:  Other low back pain  Muscle weakness (generalized)  Other symptoms and signs involving the musculoskeletal system  Unsteadiness on feet  ONSET DATE: chronic- years   SUBJECTIVE:                                                                                                                                                                                            SUBJECTIVE STATEMENT:  I'm actually feeling a little better already after the stretches you gave    EVAL: My back will start to hurt after standing for about 5 minutes or more. I was walking the other day and noticed that I also had a pain in the back of my thigh. Felt that same pain in the back of my thigh again yesterday. Have some pain up at the top of my back too. Leg/upper back/lower back pain all feel like separate issues, no shooting pains or sciatica that I know of. PA put me on Meloxicam recently. I've had this for years but it comes on faster now. I've been pretty sedentary but trying to be more active, started walking.   PERTINENT HISTORY:  See above   PAIN:  Are you having pain? No 0/10   PRECAUTIONS: None  RED FLAGS: None   WEIGHT BEARING RESTRICTIONS: No  FALLS:  Has patient fallen in last 6 months? Yes. Number of falls 1- not sure exactly what happened, have had other falls in the past due to issues with HTN meds   LIVING ENVIRONMENT: Lives with: lives with their spouse Lives in: House/apartment   OCCUPATION: retired  PLOF: Independent, Independent with basic ADLs, Independent with gait, and Independent with transfers  PATIENT GOALS: get rid of pain, learn how to manage it   NEXT MD VISIT: Referring in 6 weeks   OBJECTIVE:  Note: Objective measures were completed at Evaluation unless otherwise noted.   PATIENT SURVEYS:   Patient-Specific Activity Scoring Scheme  "0" represents "unable to perform." "10" represents "able to perform at prior level. 0 1 2 3 4 5 6 7 8 9  10 (Date and Score)   Activity Eval     1. Standing for >10 minutes  3    2. Walking longer distances   8    3.  Balance skills  7   4.    5.    Score  6    Total score = sum of the activity scores/number of activities Minimum detectable change (90%CI) for average score = 2 points Minimum detectable change (90%CI) for single activity score = 3  points     COGNITION: Overall cognitive status: Within functional limits for tasks assessed      MUSCLE LENGTH:  HS severe limitation B, piriformis mild limitation B, hip flexors mod limitation B     POSTURE: rounded shoulders and forward head  PALPATION:  Low back very tight, multiple mm spasms noted   LUMBAR ROM:   AROM eval  Flexion Severe limitation   Extension Moderate limitation  Right lateral flexion WNL   Left lateral flexion WNL  Right rotation WNL   Left rotation WNL    (Blank rows = not tested)   LOWER EXTREMITY MMT:    MMT Right eval Left eval  Hip flexion 4 4  Hip extension    Hip abduction 4+ 4+  Hip adduction    Hip internal rotation    Hip external rotation    Knee flexion 4- 4-  Knee extension 4 4  Ankle dorsiflexion    Ankle plantarflexion    Ankle inversion    Ankle eversion     (Blank rows = not tested)  LUMBAR SPECIAL TESTS:  Straight leg raise test: Negative  FUNCTIONAL TESTS:  Dynamic Gait Index: 21/24     TREATMENT DATE:   06/05/23  Nustep L5x8 minutes seat 10 all four extremities   HS stretches 2x30 seconds hooklying with strap Supine figure 4 stretch 2x30 seconds B  Lumbar rotation 5x3 seconds B  Bridges + ABD into red TB 12x2 seconds  Sidelying clams red TB x10 B Hip flexor stretch 1x30 seconds B  PPT 12x3 seconds supine PPT + march x10 B      06/03/23  Eval, HEP, POC                     HEP practice as below                                                                                                                PATIENT EDUCATION:  Education details: exam findings, POC, HEP  Person educated: Patient Education method: Programmer, multimedia, Demonstration, and Handouts Education comprehension: verbalized understanding, returned demonstration, and needs further education  HOME EXERCISE PROGRAM: Access Code: [email or web address] URL: [email or web address] Date: 06/03/2023 Prepared by: Terrel Ferries  Exercises - Supine Single Knee to Chest Stretch  - 2 x daily - 7 x weekly - 1 sets - 5 reps - 5 seconds  hold - Supine Lower Trunk Rotation  - 2 x daily - 7 x weekly - 1 sets - 5 reps - 5 seconds  hold - Hooklying Hamstring Stretch with Strap  - 2 x daily - 7 x weekly - 1 sets - 3 reps - 30 seconds  hold - Supine Transversus Abdominis Bracing - Hands on Stomach  - 5 x daily - 7 x weekly - 1 sets -  10 reps - 3 seconds  hold  ASSESSMENT:  CLINICAL IMPRESSION:  Pt arrives today doing better, he has been working on the HEP already with some improvement. Worked on general lumbar/hip mobility and flexibility today as well as core and hip strengthening, tolerated all interventions well- will continue to progress as able/appropriate.    EVAL: Patient is a 78 y.o. M who was seen today for physical therapy evaluation and treatment for LBP. Objectives as above, I think that a lot of his symptoms are related to general deconditioning, joint stiffness, and mm tightness related to sedentary lifestyle. He is mildly unsteady but not too bad. Anticipate that he will benefit from skilled PT services moving forward.    OBJECTIVE IMPAIRMENTS: decreased activity tolerance, decreased balance, decreased mobility, difficulty walking, decreased ROM, decreased strength, increased fascial restrictions, increased muscle spasms, impaired flexibility, postural dysfunction, and pain.   ACTIVITY LIMITATIONS: carrying, lifting, standing, squatting, transfers, bed mobility, and locomotion level  PARTICIPATION LIMITATIONS: meal prep, cleaning, shopping, community activity, and yard work  PERSONAL FACTORS: Age, Education, Fitness, Past/current experiences, and Time since onset of injury/illness/exacerbation are also affecting patient's functional outcome.   REHAB POTENTIAL: Good  CLINICAL DECISION MAKING: Stable/uncomplicated  EVALUATION COMPLEXITY: Low   GOALS: Goals reviewed with patient? No  SHORT TERM  GOALS: Target date: 06/24/2023    Will be compliant with appropriate progressive HEP GOAL STATUS: Initial   2. Will demonstrate improved postural awareness with all functional tasks, use of ergonomic aides PRN/as desired GOAL STATUS: Initial   3. Will demonstrate good functional biomechanics for bed mobility and floor to waist lifting mechanics  GOAL STATUS: Initial   4. Lumbar flexion and extension AROM to be no more than 25% limited  GOAL STATUS: Initial    LONG TERM GOALS: Target date: 07/15/2023    MMT to have improved by one grade all weak groups GOAL STATUS: Initial  2. Mm flexibility and spasms to have improved by at least 50% in order to improve functional movement patterns and for pain control GOAL STATUS: Initial  3. Pain to be no more than 1/10 at worst with all functional activities GOAL STATUS: Initial   4. Will have improved score on PSFS by at least 2 points to show improved QOL and subjective perception of condition  GOAL STATUS: Initial    PLAN:  PT FREQUENCY: 2x/week  PT DURATION: 6 weeks  PLANNED INTERVENTIONS: 97750- Physical Performance Testing, 97110-Therapeutic exercises, 97530- Therapeutic activity, [id number]- Neuromuscular re-education, 97535- Self Care, 86578- Manual therapy, [id number]- Aquatic Therapy, 97035- Ultrasound, [id number]- Ionotophoresis 4mg /ml Dexamethasone , Taping, and Dry Needling.  PLAN FOR NEXT SESSION: hip and LE stretching/mobility, core strength, manual/DN as desired, assess and progress, update HEP next visit before he goes to [facility], PT, DPT 06/05/23 3:13 PM

## 2023-06-13 ENCOUNTER — Ambulatory Visit: Attending: Orthopaedic Surgery | Admitting: Physical Therapy

## 2023-06-13 ENCOUNTER — Encounter: Payer: Self-pay | Admitting: Physical Therapy

## 2023-06-13 DIAGNOSIS — R2681 Unsteadiness on feet: Secondary | ICD-10-CM | POA: Insufficient documentation

## 2023-06-13 DIAGNOSIS — R29898 Other symptoms and signs involving the musculoskeletal system: Secondary | ICD-10-CM | POA: Insufficient documentation

## 2023-06-13 DIAGNOSIS — M5459 Other low back pain: Secondary | ICD-10-CM | POA: Diagnosis present

## 2023-06-13 DIAGNOSIS — M6281 Muscle weakness (generalized): Secondary | ICD-10-CM | POA: Diagnosis present

## 2023-06-13 NOTE — Therapy (Signed)
 OUTPATIENT PHYSICAL THERAPY THORACOLUMBAR TREATMENT    Patient Name: Jeffrey Stein MRN: [id number] DOB:[birth year], 78 y.o., male Today's Date: 06/13/2023  END OF SESSION:  PT End of Session - 06/13/23 0759     Visit Number 3    Number of Visits 13    Date for PT Re-Evaluation 07/15/23    Authorization Type [employer] MCR    Authorization Time Period 06/03/23 to 07/29/23    Progress Note Due on Visit 10    PT Start Time 0800    PT Stop Time 0845    PT Time Calculation (min) 45 min              Past Medical History:  Diagnosis Date   Abnormal liver enzymes    Allergic rhinitis    Allergy    seasonal and environmental   CAD (coronary artery disease)    Cataract    Colon polyps    Diverticulosis    ED (erectile dysfunction)    GERD (gastroesophageal reflux disease)    Heart murmur    20 yrs ago   Hemochromatosis    HTN (hypertension)    Hyperlipemia    Meniere's disease    Prostate cancer (HCC)    Retinal detachment    Past Surgical History:  Procedure Laterality Date   APPENDECTOMY     BASAL CELL CARCINOMA EXCISION  2014   behind right knee and back   cataracts      COLONOSCOPY  2020   COLONOSCOPY WITH PROPOFOL  N/A 09/23/2018   Procedure: COLONOSCOPY WITH PROPOFOL ;  Surgeon: Albertina Hugger, MD;  Location: [facility];  Service: Gastroenterology;  Laterality: N/A;   COLONOSCOPY WITH PROPOFOL  N/A 10/26/2019   Procedure: COLONOSCOPY WITH PROPOFOL ;  Surgeon: Albertina Hugger, MD;  Location: [facility];  Service: Gastroenterology;  Laterality: N/A;   HEMOSTASIS CLIP PLACEMENT  09/23/2018   Procedure: HEMOSTASIS CLIP PLACEMENT;  Surgeon: Albertina Hugger, MD;  Location: [facility];  Service: Gastroenterology;;   HOT HEMOSTASIS N/A 09/23/2018   Procedure: HOT HEMOSTASIS (ARGON PLASMA COAGULATION/BICAP);  Surgeon: Albertina Hugger, MD;  Location: [facility];  Service: Gastroenterology;  Laterality: N/A;   HOT HEMOSTASIS N/A 10/26/2019   Procedure: HOT  HEMOSTASIS (ARGON PLASMA COAGULATION/BICAP);  Surgeon: Albertina Hugger, MD;  Location: [facility];  Service: Gastroenterology;  Laterality: N/A;   POLYPECTOMY     POLYPECTOMY  09/23/2018   Procedure: POLYPECTOMY;  Surgeon: Albertina Hugger, MD;  Location: [facility];  Service: Gastroenterology;;   POLYPECTOMY  10/26/2019   Procedure: POLYPECTOMY;  Surgeon: Albertina Hugger, MD;  Location: [facility];  Service: Gastroenterology;;   PROSTATECTOMY     RETINAL DETACHMENT SURGERY     SUBMUCOSAL LIFTING INJECTION  09/23/2018   Procedure: SUBMUCOSAL LIFTING INJECTION;  Surgeon: Albertina Hugger, MD;  Location: [facility];  Service: Gastroenterology;;   tripple bypass N/A 12/07/2015   Patient Active Problem List   Diagnosis Date Noted   B12 deficiency 06/30/2021   Healthcare maintenance 06/30/2021   Hypotension due to drugs 11/24/2019   Elevated LDL cholesterol level 11/24/2019   History of colonic polyps    Cecal polyp    Motion sickness 09/16/2017   Primary insomnia 04/29/2017   Eczema 04/29/2017   Colon polyp 10/30/2016   Statin intolerance 04/26/2015   Hyperlipidemia 09/24/2014   Aortic stenosis 09/24/2014   Essential hypertension 03/09/2013   CA of prostate (HCC) 02/13/2013   Abnormal prostate specific antigen 09/13/2011  PCP: Tonna Frederic MD   REFERRING PROVIDER: Dayne Even, MD  REFERRING DIAG: LBP   Rationale for Evaluation and Treatment: Rehabilitation  THERAPY DIAG:  Muscle weakness (generalized)  Other low back pain  Other symptoms and signs involving the musculoskeletal system  Unsteadiness on feet  ONSET DATE: chronic- years   SUBJECTIVE:                                                                                                                                                                                           SUBJECTIVE STATEMENT:  Back feels fine. It is tight in the morning, and loosens with activity. Has been  doing HEP some of the time. The stretching have helped. Standing for long periods of time causes his LBP to increase and a radiating ache runs into his R leg.    EVAL: My back will start to hurt after standing for about 5 minutes or more. I was walking the other day and noticed that I also had a pain in the back of my thigh. Felt that same pain in the back of my thigh again yesterday. Have some pain up at the top of my back too. Leg/upper back/lower back pain all feel like separate issues, no shooting pains or sciatica that I know of. PA put me on Meloxicam recently. I've had this for years but it comes on faster now. I've been pretty sedentary but trying to be more active, started walking.   PERTINENT HISTORY:  See above   PAIN:  Are you having pain? No 0/10   PRECAUTIONS: None  RED FLAGS: None   WEIGHT BEARING RESTRICTIONS: No  FALLS:  Has patient fallen in last 6 months? Yes. Number of falls 1- not sure exactly what happened, have had other falls in the past due to issues with HTN meds   LIVING ENVIRONMENT: Lives with: lives with their spouse Lives in: House/apartment   OCCUPATION: retired  PLOF: Independent, Independent with basic ADLs, Independent with gait, and Independent with transfers  PATIENT GOALS: get rid of pain, learn how to manage it   NEXT MD VISIT: Referring in 6 weeks   OBJECTIVE:  Note: Objective measures were completed at Evaluation unless otherwise noted.   PATIENT SURVEYS:   Patient-Specific Activity Scoring Scheme  "0" represents "unable to perform." "10" represents "able to perform at prior level. 0 1 2 3 4 5 6 7 8 9  10 (Date and Score)   Activity Eval     1. Standing for >10 minutes  3    2. Walking longer distances   8    3.  Balance skills  7   4.    5.    Score 6    Total score = sum of the activity scores/number of activities Minimum detectable change (90%CI) for average score = 2 points Minimum detectable change (90%CI) for single  activity score = 3 points     COGNITION: Overall cognitive status: Within functional limits for tasks assessed      MUSCLE LENGTH:  HS severe limitation B, piriformis mild limitation B, hip flexors mod limitation B     POSTURE: rounded shoulders and forward head  PALPATION:  Low back very tight, multiple mm spasms noted   LUMBAR ROM:   AROM eval  Flexion Severe limitation   Extension Moderate limitation  Right lateral flexion WNL   Left lateral flexion WNL  Right rotation WNL   Left rotation WNL    (Blank rows = not tested)   LOWER EXTREMITY MMT:    MMT Right eval Left eval  Hip flexion 4 4  Hip extension    Hip abduction 4+ 4+  Hip adduction    Hip internal rotation    Hip external rotation    Knee flexion 4- 4-  Knee extension 4 4  Ankle dorsiflexion    Ankle plantarflexion    Ankle inversion    Ankle eversion     (Blank rows = not tested)  LUMBAR SPECIAL TESTS:  Straight leg raise test: Negative  FUNCTIONAL TESTS:  Dynamic Gait Index: 21/24     TREATMENT DATE:  06/13/23 NuStep L4 Pallof press 10# x10 each side Split stance on 8in box + head swivels 10# shoulder ext, Rows x10 each 6in box taps Sit to stand on airex x10 Bridges 2x10 Feet on ball   Side to side x10  KTC x10 HS, KTC,  piriformis stretch Educate on log rolling    06/05/23  Nustep L5x8 minutes seat 10 all four extremities   HS stretches 2x30 seconds hooklying with strap Supine figure 4 stretch 2x30 seconds B  Lumbar rotation 5x3 seconds B  Bridges + ABD into red TB 12x2 seconds  Sidelying clams red TB x10 B Hip flexor stretch 1x30 seconds B  PPT 12x3 seconds supine PPT + march x10 B      06/03/23  Eval, HEP, POC                     HEP practice as below                                                                                                                PATIENT EDUCATION:  Education details: exam findings, POC, HEP  Person educated:  Patient Education method: Programmer, multimedia, Demonstration, and Handouts Education comprehension: verbalized understanding, returned demonstration, and needs further education  HOME EXERCISE PROGRAM: Access Code: [email or web address] URL: [email or web address] Date: 06/03/2023 Prepared by: Terrel Ferries  Exercises - Supine Single Knee to Chest Stretch  - 2 x daily - 7 x weekly - 1 sets - 5 reps - 5 seconds  hold - Supine  Lower Trunk Rotation  - 2 x daily - 7 x weekly - 1 sets - 5 reps - 5 seconds  hold - Hooklying Hamstring Stretch with Strap  - 2 x daily - 7 x weekly - 1 sets - 3 reps - 30 seconds  hold - Supine Transversus Abdominis Bracing - Hands on Stomach  - 5 x daily - 7 x weekly - 1 sets - 10 reps - 3 seconds  hold  ASSESSMENT:  CLINICAL IMPRESSION:  Pt arrives today doing better. He thinks his stretching at home is helping and activity loosens his back.  He did well with all exercises and was aware of core activation with each. I educated him on logrolling  help protect his back and encouraged him to practice at home. He would benefit from further PT to strengthen core, improve balance, and improve self management of his condition.   EVAL: Patient is a 78 y.o. M who was seen today for physical therapy evaluation and treatment for LBP. Objectives as above, I think that a lot of his symptoms are related to general deconditioning, joint stiffness, and mm tightness related to sedentary lifestyle. He is mildly unsteady but not too bad. Anticipate that he will benefit from skilled PT services moving forward.    OBJECTIVE IMPAIRMENTS: decreased activity tolerance, decreased balance, decreased mobility, difficulty walking, decreased ROM, decreased strength, increased fascial restrictions, increased muscle spasms, impaired flexibility, postural dysfunction, and pain.   ACTIVITY LIMITATIONS: carrying, lifting, standing, squatting, transfers, bed mobility, and locomotion level  PARTICIPATION  LIMITATIONS: meal prep, cleaning, shopping, community activity, and yard work  PERSONAL FACTORS: Age, Education, Fitness, Past/current experiences, and Time since onset of injury/illness/exacerbation are also affecting patient's functional outcome.   REHAB POTENTIAL: Good  CLINICAL DECISION MAKING: Stable/uncomplicated  EVALUATION COMPLEXITY: Low   GOALS: Goals reviewed with patient? No  SHORT TERM GOALS: Target date: 06/24/2023    Will be compliant with appropriate progressive HEP GOAL STATUS: Initial   2. Will demonstrate improved postural awareness with all functional tasks, use of ergonomic aides PRN/as desired GOAL STATUS: Initial   3. Will demonstrate good functional biomechanics for bed mobility and floor to waist lifting mechanics  GOAL STATUS: Initial   4. Lumbar flexion and extension AROM to be no more than 25% limited  GOAL STATUS: Initial    LONG TERM GOALS: Target date: 07/15/2023    MMT to have improved by one grade all weak groups GOAL STATUS: Initial  2. Mm flexibility and spasms to have improved by at least 50% in order to improve functional movement patterns and for pain control GOAL STATUS: Initial  3. Pain to be no more than 1/10 at worst with all functional activities GOAL STATUS: Initial   4. Will have improved score on PSFS by at least 2 points to show improved QOL and subjective perception of condition  GOAL STATUS: Initial    PLAN:  PT FREQUENCY: 2x/week  PT DURATION: 6 weeks  PLANNED INTERVENTIONS: 97750- Physical Performance Testing, 97110-Therapeutic exercises, 97530- Therapeutic activity, [id number]- Neuromuscular re-education, 97535- Self Care, 09811- Manual therapy, [id number]- Aquatic Therapy, 97035- Ultrasound, 91478- Ionotophoresis 4mg /ml Dexamethasone , Taping, and Dry Needling.  PLAN FOR NEXT SESSION: hip and LE stretching/mobility, core strength, manual/DN as desired, assess and progress, update HEP next visit before he goes to [employer], SPTA 06/13/23 8:00 AM

## 2023-06-21 ENCOUNTER — Ambulatory Visit

## 2023-06-21 DIAGNOSIS — M5459 Other low back pain: Secondary | ICD-10-CM

## 2023-06-21 DIAGNOSIS — R2681 Unsteadiness on feet: Secondary | ICD-10-CM

## 2023-06-21 DIAGNOSIS — M6281 Muscle weakness (generalized): Secondary | ICD-10-CM | POA: Diagnosis not present

## 2023-06-21 DIAGNOSIS — R29898 Other symptoms and signs involving the musculoskeletal system: Secondary | ICD-10-CM

## 2023-06-21 NOTE — Therapy (Signed)
 OUTPATIENT PHYSICAL THERAPY THORACOLUMBAR TREATMENT    Patient Name: Jeffrey Stein MRN: [id number] DOB:08-01-1945, 78 y.o., male Today's Date: 06/21/2023  END OF SESSION:  PT End of Session - 06/21/23 1054     Visit Number 4    Number of Visits 13    Date for PT Re-Evaluation 07/15/23    Authorization Type [employer] MCR    Authorization Time Period 06/03/23 to 07/29/23    Progress Note Due on Visit 10    PT Start Time 1100    PT Stop Time 1145    PT Time Calculation (min) 45 min               Past Medical History:  Diagnosis Date   Abnormal liver enzymes    Allergic rhinitis    Allergy    seasonal and environmental   CAD (coronary artery disease)    Cataract    Colon polyps    Diverticulosis    ED (erectile dysfunction)    GERD (gastroesophageal reflux disease)    Heart murmur    20 yrs ago   Hemochromatosis    HTN (hypertension)    Hyperlipemia    Meniere's disease    Prostate cancer (HCC)    Retinal detachment    Past Surgical History:  Procedure Laterality Date   APPENDECTOMY     BASAL CELL CARCINOMA EXCISION  2014   behind right knee and back   cataracts      COLONOSCOPY  2020   COLONOSCOPY WITH PROPOFOL  N/A 09/23/2018   Procedure: COLONOSCOPY WITH PROPOFOL ;  Surgeon: Albertina Hugger, MD;  Location: [facility];  Service: Gastroenterology;  Laterality: N/A;   COLONOSCOPY WITH PROPOFOL  N/A 10/26/2019   Procedure: COLONOSCOPY WITH PROPOFOL ;  Surgeon: Albertina Hugger, MD;  Location: [facility];  Service: Gastroenterology;  Laterality: N/A;   HEMOSTASIS CLIP PLACEMENT  09/23/2018   Procedure: HEMOSTASIS CLIP PLACEMENT;  Surgeon: Albertina Hugger, MD;  Location: [facility];  Service: Gastroenterology;;   HOT HEMOSTASIS N/A 09/23/2018   Procedure: HOT HEMOSTASIS (ARGON PLASMA COAGULATION/BICAP);  Surgeon: Albertina Hugger, MD;  Location: [facility];  Service: Gastroenterology;  Laterality: N/A;   HOT HEMOSTASIS N/A 10/26/2019   Procedure: HOT  HEMOSTASIS (ARGON PLASMA COAGULATION/BICAP);  Surgeon: Albertina Hugger, MD;  Location: [facility];  Service: Gastroenterology;  Laterality: N/A;   POLYPECTOMY     POLYPECTOMY  09/23/2018   Procedure: POLYPECTOMY;  Surgeon: Albertina Hugger, MD;  Location: [facility];  Service: Gastroenterology;;   POLYPECTOMY  10/26/2019   Procedure: POLYPECTOMY;  Surgeon: Albertina Hugger, MD;  Location: [facility];  Service: Gastroenterology;;   PROSTATECTOMY     RETINAL DETACHMENT SURGERY     SUBMUCOSAL LIFTING INJECTION  09/23/2018   Procedure: SUBMUCOSAL LIFTING INJECTION;  Surgeon: Albertina Hugger, MD;  Location: [facility];  Service: Gastroenterology;;   tripple bypass N/A 12/07/2015   Patient Active Problem List   Diagnosis Date Noted   B12 deficiency 06/30/2021   Healthcare maintenance 06/30/2021   Hypotension due to drugs 11/24/2019   Elevated LDL cholesterol level 11/24/2019   History of colonic polyps    Cecal polyp    Motion sickness 09/16/2017   Primary insomnia 04/29/2017   Eczema 04/29/2017   Colon polyp 10/30/2016   Statin intolerance 04/26/2015   Hyperlipidemia 09/24/2014   Aortic stenosis 09/24/2014   Essential hypertension 03/09/2013   CA of prostate (HCC) 02/13/2013   Abnormal prostate specific antigen 09/13/2011  PCP: Tonna Frederic MD   REFERRING PROVIDER: Dayne Even, MD  REFERRING DIAG: LBP   Rationale for Evaluation and Treatment: Rehabilitation  THERAPY DIAG:  Other low back pain  Other symptoms and signs involving the musculoskeletal system  Unsteadiness on feet  ONSET DATE: chronic- years   SUBJECTIVE:                                                                                                                                                                                           SUBJECTIVE STATEMENT:  Back feels fine. A little bit tight, I was out of town on vacation and just did some stretches. I have noticed that it  is more stiff in the mornings. No pain just some stiffness. Gets better throughout the day.     EVAL: My back will start to hurt after standing for about 5 minutes or more. I was walking the other day and noticed that I also had a pain in the back of my thigh. Felt that same pain in the back of my thigh again yesterday. Have some pain up at the top of my back too. Leg/upper back/lower back pain all feel like separate issues, no shooting pains or sciatica that I know of. PA put me on Meloxicam recently. I've had this for years but it comes on faster now. I've been pretty sedentary but trying to be more active, started walking.   PERTINENT HISTORY:  See above   PAIN:  Are you having pain? No 0/10   PRECAUTIONS: None  RED FLAGS: None   WEIGHT BEARING RESTRICTIONS: No  FALLS:  Has patient fallen in last 6 months? Yes. Number of falls 1- not sure exactly what happened, have had other falls in the past due to issues with HTN meds   LIVING ENVIRONMENT: Lives with: lives with their spouse Lives in: House/apartment   OCCUPATION: retired  PLOF: Independent, Independent with basic ADLs, Independent with gait, and Independent with transfers  PATIENT GOALS: get rid of pain, learn how to manage it   NEXT MD VISIT: Referring in 6 weeks   OBJECTIVE:  Note: Objective measures were completed at Evaluation unless otherwise noted.   PATIENT SURVEYS:   Patient-Specific Activity Scoring Scheme  "0" represents "unable to perform." "10" represents "able to perform at prior level. 0 1 2 3 4 5 6 7 8 9  10 (Date and Score)   Activity Eval     1. Standing for >10 minutes  3    2. Walking longer distances   8    3.  Balance skills  7   4.  5.    Score 6    Total score = sum of the activity scores/number of activities Minimum detectable change (90%CI) for average score = 2 points Minimum detectable change (90%CI) for single activity score = 3 points     COGNITION: Overall cognitive  status: Within functional limits for tasks assessed      MUSCLE LENGTH:  HS severe limitation B, piriformis mild limitation B, hip flexors mod limitation B     POSTURE: rounded shoulders and forward head  PALPATION:  Low back very tight, multiple mm spasms noted   LUMBAR ROM:   AROM eval  Flexion Severe limitation   Extension Moderate limitation  Right lateral flexion WNL   Left lateral flexion WNL  Right rotation WNL   Left rotation WNL    (Blank rows = not tested)   LOWER EXTREMITY MMT:    MMT Right eval Left eval  Hip flexion 4 4  Hip extension    Hip abduction 4+ 4+  Hip adduction    Hip internal rotation    Hip external rotation    Knee flexion 4- 4-  Knee extension 4 4  Ankle dorsiflexion    Ankle plantarflexion    Ankle inversion    Ankle eversion     (Blank rows = not tested)  LUMBAR SPECIAL TESTS:  Straight leg raise test: Negative  FUNCTIONAL TESTS:  Dynamic Gait Index: 21/24     TREATMENT DATE:  06/21/23 NuStep L5 x64mins Shoulder ext 10# 2x10  Seated row 25# 2x10 Lat pull down 25# 2x10  STS to OHP 2x10 Walking on beam   06/13/23 NuStep L4 Pallof press 10# x10 each side Split stance on 8in box + head swivels 10# shoulder ext, Rows x10 each 6in box taps Sit to stand on airex x10 Bridges 2x10 Feet on ball   Side to side x10  KTC x10 HS, KTC,  piriformis stretch Educate on log rolling    06/05/23  Nustep L5x8 minutes seat 10 all four extremities   HS stretches 2x30 seconds hooklying with strap Supine figure 4 stretch 2x30 seconds B  Lumbar rotation 5x3 seconds B  Bridges + ABD into red TB 12x2 seconds  Sidelying clams red TB x10 B Hip flexor stretch 1x30 seconds B  PPT 12x3 seconds supine PPT + march x10 B      06/03/23  Eval, HEP, POC                     HEP practice as below                                                                                                                PATIENT EDUCATION:   Education details: exam findings, POC, HEP  Person educated: Patient Education method: Programmer, multimedia, Demonstration, and Handouts Education comprehension: verbalized understanding, returned demonstration, and needs further education  HOME EXERCISE PROGRAM: Access Code: [email or web address] URL: [email or web address] Date: 06/03/2023 Prepared by: Terrel Ferries  Exercises - Supine Single Knee to Chest Stretch  -  2 x daily - 7 x weekly - 1 sets - 5 reps - 5 seconds  hold - Supine Lower Trunk Rotation  - 2 x daily - 7 x weekly - 1 sets - 5 reps - 5 seconds  hold - Hooklying Hamstring Stretch with Strap  - 2 x daily - 7 x weekly - 1 sets - 3 reps - 30 seconds  hold - Supine Transversus Abdominis Bracing - Hands on Stomach  - 5 x daily - 7 x weekly - 1 sets - 10 reps - 3 seconds  hold  ASSESSMENT:  CLINICAL IMPRESSION: Pt arrives doing better, still has some complaints of stiffness but not pain. Stretching has been helping some. We worked on back strengthening today as well as postural strengthening. Cues needed for form. Ended with some balance training on beam. He would benefit from further PT to strengthen core, improve balance, and improve self management of his condition.   EVAL: Patient is a 78 y.o. M who was seen today for physical therapy evaluation and treatment for LBP. Objectives as above, I think that a lot of his symptoms are related to general deconditioning, joint stiffness, and mm tightness related to sedentary lifestyle. He is mildly unsteady but not too bad. Anticipate that he will benefit from skilled PT services moving forward.    OBJECTIVE IMPAIRMENTS: decreased activity tolerance, decreased balance, decreased mobility, difficulty walking, decreased ROM, decreased strength, increased fascial restrictions, increased muscle spasms, impaired flexibility, postural dysfunction, and pain.   ACTIVITY LIMITATIONS: carrying, lifting, standing, squatting, transfers, bed mobility, and  locomotion level  PARTICIPATION LIMITATIONS: meal prep, cleaning, shopping, community activity, and yard work  PERSONAL FACTORS: Age, Education, Fitness, Past/current experiences, and Time since onset of injury/illness/exacerbation are also affecting patient's functional outcome.   REHAB POTENTIAL: Good  CLINICAL DECISION MAKING: Stable/uncomplicated  EVALUATION COMPLEXITY: Low   GOALS: Goals reviewed with patient? No  SHORT TERM GOALS: Target date: 06/24/2023    Will be compliant with appropriate progressive HEP GOAL STATUS: Initial   2. Will demonstrate improved postural awareness with all functional tasks, use of ergonomic aides PRN/as desired GOAL STATUS: Initial   3. Will demonstrate good functional biomechanics for bed mobility and floor to waist lifting mechanics  GOAL STATUS: Initial   4. Lumbar flexion and extension AROM to be no more than 25% limited  GOAL STATUS: Initial    LONG TERM GOALS: Target date: 07/15/2023    MMT to have improved by one grade all weak groups GOAL STATUS: Initial  2. Mm flexibility and spasms to have improved by at least 50% in order to improve functional movement patterns and for pain control GOAL STATUS: Initial  3. Pain to be no more than 1/10 at worst with all functional activities GOAL STATUS: Initial   4. Will have improved score on PSFS by at least 2 points to show improved QOL and subjective perception of condition  GOAL STATUS: Initial    PLAN:  PT FREQUENCY: 2x/week  PT DURATION: 6 weeks  PLANNED INTERVENTIONS: 97750- Physical Performance Testing, 97110-Therapeutic exercises, 97530- Therapeutic activity, [id number]- Neuromuscular re-education, 97535- Self Care, 40981- Manual therapy, [id number]- Aquatic Therapy, 97035- Ultrasound, [id number]- Ionotophoresis 4mg /ml Dexamethasone , Taping, and Dry Needling.  PLAN FOR NEXT SESSION: hip and LE stretching/mobility, core strength, manual/DN as desired, assess and progress, SLS,  tandem On airex reaching, side steps onto airex   Donavon Fudge, PT, DPT 06/21/23 11:41 AM

## 2023-06-24 ENCOUNTER — Ambulatory Visit

## 2023-06-24 DIAGNOSIS — M5459 Other low back pain: Secondary | ICD-10-CM

## 2023-06-24 DIAGNOSIS — M6281 Muscle weakness (generalized): Secondary | ICD-10-CM

## 2023-06-24 DIAGNOSIS — R29898 Other symptoms and signs involving the musculoskeletal system: Secondary | ICD-10-CM

## 2023-06-24 DIAGNOSIS — R2681 Unsteadiness on feet: Secondary | ICD-10-CM

## 2023-06-24 NOTE — Therapy (Signed)
 OUTPATIENT PHYSICAL THERAPY THORACOLUMBAR TREATMENT    Patient Name: Jeffrey Stein MRN: [id number] DOB:[birth year], 78 y.o., male Today's Date: 06/24/2023  END OF SESSION:  PT End of Session - 06/24/23 1440     Visit Number 5    Number of Visits 13    Date for PT Re-Evaluation 07/15/23    Authorization Type Aetna [facility]    Authorization Time Period 06/03/23 to 07/29/23    Progress Note Due on Visit 10    PT Start Time 1440    PT Stop Time 1525    PT Time Calculation (min) 45 min                Past Medical History:  Diagnosis Date   Abnormal liver enzymes    Allergic rhinitis    Allergy    seasonal and environmental   CAD (coronary artery disease)    Cataract    Colon polyps    Diverticulosis    ED (erectile dysfunction)    GERD (gastroesophageal reflux disease)    Heart murmur    20 yrs ago   Hemochromatosis    HTN (hypertension)    Hyperlipemia    Meniere's disease    Prostate cancer (HCC)    Retinal detachment    Past Surgical History:  Procedure Laterality Date   APPENDECTOMY     BASAL CELL CARCINOMA EXCISION  2014   behind right knee and back   cataracts      COLONOSCOPY  2020   COLONOSCOPY WITH PROPOFOL  N/A 09/23/2018   Procedure: COLONOSCOPY WITH PROPOFOL ;  Surgeon: Albertina Hugger, MD;  Location: [facility];  Service: Gastroenterology;  Laterality: N/A;   COLONOSCOPY WITH PROPOFOL  N/A 10/26/2019   Procedure: COLONOSCOPY WITH PROPOFOL ;  Surgeon: Albertina Hugger, MD;  Location: [facility];  Service: Gastroenterology;  Laterality: N/A;   HEMOSTASIS CLIP PLACEMENT  09/23/2018   Procedure: HEMOSTASIS CLIP PLACEMENT;  Surgeon: Albertina Hugger, MD;  Location: [facility];  Service: Gastroenterology;;   HOT HEMOSTASIS N/A 09/23/2018   Procedure: HOT HEMOSTASIS (ARGON PLASMA COAGULATION/BICAP);  Surgeon: Albertina Hugger, MD;  Location: [facility];  Service: Gastroenterology;  Laterality: N/A;   HOT HEMOSTASIS N/A 10/26/2019   Procedure: HOT  HEMOSTASIS (ARGON PLASMA COAGULATION/BICAP);  Surgeon: Albertina Hugger, MD;  Location: [facility];  Service: Gastroenterology;  Laterality: N/A;   POLYPECTOMY     POLYPECTOMY  09/23/2018   Procedure: POLYPECTOMY;  Surgeon: Albertina Hugger, MD;  Location: [facility];  Service: Gastroenterology;;   POLYPECTOMY  10/26/2019   Procedure: POLYPECTOMY;  Surgeon: Albertina Hugger, MD;  Location: [facility];  Service: Gastroenterology;;   PROSTATECTOMY     RETINAL DETACHMENT SURGERY     SUBMUCOSAL LIFTING INJECTION  09/23/2018   Procedure: SUBMUCOSAL LIFTING INJECTION;  Surgeon: Albertina Hugger, MD;  Location: [facility];  Service: Gastroenterology;;   tripple bypass N/A 12/07/2015   Patient Active Problem List   Diagnosis Date Noted   B12 deficiency 06/30/2021   Healthcare maintenance 06/30/2021   Hypotension due to drugs 11/24/2019   Elevated LDL cholesterol level 11/24/2019   History of colonic polyps    Cecal polyp    Motion sickness 09/16/2017   Primary insomnia 04/29/2017   Eczema 04/29/2017   Colon polyp 10/30/2016   Statin intolerance 04/26/2015   Hyperlipidemia 09/24/2014   Aortic stenosis 09/24/2014   Essential hypertension 03/09/2013   CA of prostate (HCC) 02/13/2013   Abnormal prostate specific antigen  09/13/2011    PCP: Tonna Frederic MD   REFERRING PROVIDER: Dayne Even, MD  REFERRING DIAG: LBP   Rationale for Evaluation and Treatment: Rehabilitation  THERAPY DIAG:  Other low back pain  Other symptoms and signs involving the musculoskeletal system  Unsteadiness on feet  Muscle weakness (generalized)  ONSET DATE: chronic- years   SUBJECTIVE:                                                                                                                                                                                           SUBJECTIVE STATEMENT:  Feeling good. Back is not hurting right now.     EVAL: My back will start to hurt  after standing for about 5 minutes or more. I was walking the other day and noticed that I also had a pain in the back of my thigh. Felt that same pain in the back of my thigh again yesterday. Have some pain up at the top of my back too. Leg/upper back/lower back pain all feel like separate issues, no shooting pains or sciatica that I know of. PA put me on Meloxicam recently. I've had this for years but it comes on faster now. I've been pretty sedentary but trying to be more active, started walking.   PERTINENT HISTORY:  See above   PAIN:  Are you having pain? No 0/10   PRECAUTIONS: None  RED FLAGS: None   WEIGHT BEARING RESTRICTIONS: No  FALLS:  Has patient fallen in last 6 months? Yes. Number of falls 1- not sure exactly what happened, have had other falls in the past due to issues with HTN meds   LIVING ENVIRONMENT: Lives with: lives with their spouse Lives in: House/apartment   OCCUPATION: retired  PLOF: Independent, Independent with basic ADLs, Independent with gait, and Independent with transfers  PATIENT GOALS: get rid of pain, learn how to manage it   NEXT MD VISIT: Referring in 6 weeks   OBJECTIVE:  Note: Objective measures were completed at Evaluation unless otherwise noted.   PATIENT SURVEYS:   Patient-Specific Activity Scoring Scheme  "0" represents "unable to perform." "10" represents "able to perform at prior level. 0 1 2 3 4 5 6 7 8 9  10 (Date and Score)   Activity Eval     1. Standing for >10 minutes  3    2. Walking longer distances   8    3.  Balance skills  7   4.    5.    Score 6    Total score = sum of the activity scores/number of activities Minimum detectable change (90%CI) for  average score = 2 points Minimum detectable change (90%CI) for single activity score = 3 points     COGNITION: Overall cognitive status: Within functional limits for tasks assessed      MUSCLE LENGTH:  HS severe limitation B, piriformis mild limitation B,  hip flexors mod limitation B     POSTURE: rounded shoulders and forward head  PALPATION:  Low back very tight, multiple mm spasms noted   LUMBAR ROM:   AROM eval  Flexion Severe limitation   Extension Moderate limitation  Right lateral flexion WNL   Left lateral flexion WNL  Right rotation WNL   Left rotation WNL    (Blank rows = not tested)   LOWER EXTREMITY MMT:    MMT Right eval Left eval  Hip flexion 4 4  Hip extension    Hip abduction 4+ 4+  Hip adduction    Hip internal rotation    Hip external rotation    Knee flexion 4- 4-  Knee extension 4 4  Ankle dorsiflexion    Ankle plantarflexion    Ankle inversion    Ankle eversion     (Blank rows = not tested)  LUMBAR SPECIAL TESTS:  Straight leg raise test: Negative  FUNCTIONAL TESTS:  Dynamic Gait Index: 21/24     TREATMENT DATE:  06/24/23 Bike L3 x33mins  SLS, tandem holds  On airex reaching Side steps onto airex  Step ups 6"  LE stretching- HS, piriformis, glutes, SKTC  Feet on pball rotations and knees to chest x10  06/21/23 NuStep L5 x12mins Shoulder ext 10# 2x10  Seated row 25# 2x10 Lat pull down 25# 2x10  STS to OHP 2x10 Walking on beam   06/13/23 NuStep L4 Pallof press 10# x10 each side Split stance on 8in box + head swivels 10# shoulder ext, Rows x10 each 6in box taps Sit to stand on airex x10 Bridges 2x10 Feet on ball   Side to side x10  KTC x10 HS, KTC,  piriformis stretch Educate on log rolling    06/05/23  Nustep L5x8 minutes seat 10 all four extremities   HS stretches 2x30 seconds hooklying with strap Supine figure 4 stretch 2x30 seconds B  Lumbar rotation 5x3 seconds B  Bridges + ABD into red TB 12x2 seconds  Sidelying clams red TB x10 B Hip flexor stretch 1x30 seconds B  PPT 12x3 seconds supine PPT + march x10 B      06/03/23  Eval, HEP, POC                     HEP practice as below                                                                                                                 PATIENT EDUCATION:  Education details: exam findings, POC, HEP  Person educated: Patient Education method: Programmer, multimedia, Demonstration, and Handouts Education comprehension: verbalized understanding, returned demonstration, and needs further education  HOME EXERCISE PROGRAM: Access Code: [email or web address] URL: [email or web address] Date: 06/03/2023  Prepared by: Terrel Ferries  Exercises - Supine Single Knee to Chest Stretch  - 2 x daily - 7 x weekly - 1 sets - 5 reps - 5 seconds  hold - Supine Lower Trunk Rotation  - 2 x daily - 7 x weekly - 1 sets - 5 reps - 5 seconds  hold - Hooklying Hamstring Stretch with Strap  - 2 x daily - 7 x weekly - 1 sets - 3 reps - 30 seconds  hold - Supine Transversus Abdominis Bracing - Hands on Stomach  - 5 x daily - 7 x weekly - 1 sets - 10 reps - 3 seconds  hold  ASSESSMENT:  CLINICAL IMPRESSION: Pt arrives doing better, still has some complaints of stiffness but not pain. Stretching has been helping some. We worked on back strengthening today as well as postural strengthening. Cues needed for form. Ended with some balance training on beam. He would benefit from further PT to strengthen core, improve balance, and improve self management of his condition.   EVAL: Patient is a 78 y.o. M who was seen today for physical therapy evaluation and treatment for LBP. Objectives as above, I think that a lot of his symptoms are related to general deconditioning, joint stiffness, and mm tightness related to sedentary lifestyle. He is mildly unsteady but not too bad. Anticipate that he will benefit from skilled PT services moving forward.    OBJECTIVE IMPAIRMENTS: decreased activity tolerance, decreased balance, decreased mobility, difficulty walking, decreased ROM, decreased strength, increased fascial restrictions, increased muscle spasms, impaired flexibility, postural dysfunction, and pain.   ACTIVITY LIMITATIONS:  carrying, lifting, standing, squatting, transfers, bed mobility, and locomotion level  PARTICIPATION LIMITATIONS: meal prep, cleaning, shopping, community activity, and yard work  PERSONAL FACTORS: Age, Education, Fitness, Past/current experiences, and Time since onset of injury/illness/exacerbation are also affecting patient's functional outcome.   REHAB POTENTIAL: Good  CLINICAL DECISION MAKING: Stable/uncomplicated  EVALUATION COMPLEXITY: Low   GOALS: Goals reviewed with patient? No  SHORT TERM GOALS: Target date: 06/24/2023    Will be compliant with appropriate progressive HEP GOAL STATUS: Initial   2. Will demonstrate improved postural awareness with all functional tasks, use of ergonomic aides PRN/as desired GOAL STATUS: Initial   3. Will demonstrate good functional biomechanics for bed mobility and floor to waist lifting mechanics  GOAL STATUS: Initial   4. Lumbar flexion and extension AROM to be no more than 25% limited  GOAL STATUS: Initial    LONG TERM GOALS: Target date: 07/15/2023    MMT to have improved by one grade all weak groups GOAL STATUS: Initial  2. Mm flexibility and spasms to have improved by at least 50% in order to improve functional movement patterns and for pain control GOAL STATUS: Initial  3. Pain to be no more than 1/10 at worst with all functional activities GOAL STATUS: Initial   4. Will have improved score on PSFS by at least 2 points to show improved QOL and subjective perception of condition  GOAL STATUS: Initial    PLAN:  PT FREQUENCY: 2x/week  PT DURATION: 6 weeks  PLANNED INTERVENTIONS: 97750- Physical Performance Testing, 97110-Therapeutic exercises, 97530- Therapeutic activity, [id number]- Neuromuscular re-education, 97535- Self Care, [id number]- Manual therapy, [id number]- Aquatic Therapy, 97035- Ultrasound, 82956- Ionotophoresis 4mg /ml Dexamethasone , Taping, and Dry Needling.  PLAN FOR NEXT SESSION: hip and LE stretching/mobility, core  strength, manual/DN as desired, assess and progress,   Donavon Fudge, PT, DPT 06/24/23 3:29 PM

## 2023-06-25 NOTE — Therapy (Signed)
 OUTPATIENT PHYSICAL THERAPY THORACOLUMBAR TREATMENT    Patient Name: Jeffrey Stein MRN: [id number] DOB:03-03-1945, 78 y.o., male Today's Date: 06/26/2023  END OF SESSION:  PT End of Session - 06/26/23 0845     Visit Number 6    Number of Visits 13    Date for PT Re-Evaluation 07/15/23    Authorization Type [employer] [facility]    Authorization Time Period 06/03/23 to 07/29/23    Progress Note Due on Visit 10    PT Start Time 0845    PT Stop Time 0930    PT Time Calculation (min) 45 min                 Past Medical History:  Diagnosis Date   Abnormal liver enzymes    Allergic rhinitis    Allergy    seasonal and environmental   CAD (coronary artery disease)    Cataract    Colon polyps    Diverticulosis    ED (erectile dysfunction)    GERD (gastroesophageal reflux disease)    Heart murmur    20 yrs ago   Hemochromatosis    HTN (hypertension)    Hyperlipemia    Meniere's disease    Prostate cancer (HCC)    Retinal detachment    Past Surgical History:  Procedure Laterality Date   APPENDECTOMY     BASAL CELL CARCINOMA EXCISION  2014   behind right knee and back   cataracts      COLONOSCOPY  2020   COLONOSCOPY WITH PROPOFOL  N/A 09/23/2018   Procedure: COLONOSCOPY WITH PROPOFOL ;  Surgeon: Albertina Hugger, MD;  Location: [facility];  Service: Gastroenterology;  Laterality: N/A;   COLONOSCOPY WITH PROPOFOL  N/A 10/26/2019   Procedure: COLONOSCOPY WITH PROPOFOL ;  Surgeon: Albertina Hugger, MD;  Location: [facility];  Service: Gastroenterology;  Laterality: N/A;   HEMOSTASIS CLIP PLACEMENT  09/23/2018   Procedure: HEMOSTASIS CLIP PLACEMENT;  Surgeon: Albertina Hugger, MD;  Location: [facility];  Service: Gastroenterology;;   HOT HEMOSTASIS N/A 09/23/2018   Procedure: HOT HEMOSTASIS (ARGON PLASMA COAGULATION/BICAP);  Surgeon: Albertina Hugger, MD;  Location: [facility];  Service: Gastroenterology;  Laterality: N/A;   HOT HEMOSTASIS N/A 10/26/2019   Procedure:  HOT HEMOSTASIS (ARGON PLASMA COAGULATION/BICAP);  Surgeon: Albertina Hugger, MD;  Location: [facility];  Service: Gastroenterology;  Laterality: N/A;   POLYPECTOMY     POLYPECTOMY  09/23/2018   Procedure: POLYPECTOMY;  Surgeon: Albertina Hugger, MD;  Location: [facility];  Service: Gastroenterology;;   POLYPECTOMY  10/26/2019   Procedure: POLYPECTOMY;  Surgeon: Albertina Hugger, MD;  Location: [facility];  Service: Gastroenterology;;   PROSTATECTOMY     RETINAL DETACHMENT SURGERY     SUBMUCOSAL LIFTING INJECTION  09/23/2018   Procedure: SUBMUCOSAL LIFTING INJECTION;  Surgeon: Albertina Hugger, MD;  Location: [facility];  Service: Gastroenterology;;   tripple bypass N/A 12/07/2015   Patient Active Problem List   Diagnosis Date Noted   B12 deficiency 06/30/2021   Healthcare maintenance 06/30/2021   Hypotension due to drugs 11/24/2019   Elevated LDL cholesterol level 11/24/2019   History of colonic polyps    Cecal polyp    Motion sickness 09/16/2017   Primary insomnia 04/29/2017   Eczema 04/29/2017   Colon polyp 10/30/2016   Statin intolerance 04/26/2015   Hyperlipidemia 09/24/2014   Aortic stenosis 09/24/2014   Essential hypertension 03/09/2013   CA of prostate (HCC) 02/13/2013   Abnormal prostate specific  antigen 09/13/2011    PCP: Tonna Frederic MD   REFERRING PROVIDER: Dayne Even, MD  REFERRING DIAG: LBP   Rationale for Evaluation and Treatment: Rehabilitation  THERAPY DIAG:  Unsteadiness on feet  Muscle weakness (generalized)  Other symptoms and signs involving the musculoskeletal system  ONSET DATE: chronic- years   SUBJECTIVE:                                                                                                                                                                                           SUBJECTIVE STATEMENT:  This morning I don't feel anything in the back.     EVAL: My back will start to hurt after standing  for about 5 minutes or more. I was walking the other day and noticed that I also had a pain in the back of my thigh. Felt that same pain in the back of my thigh again yesterday. Have some pain up at the top of my back too. Leg/upper back/lower back pain all feel like separate issues, no shooting pains or sciatica that I know of. PA put me on Meloxicam recently. I've had this for years but it comes on faster now. I've been pretty sedentary but trying to be more active, started walking.   PERTINENT HISTORY:  See above   PAIN:  Are you having pain? No 0/10   PRECAUTIONS: None  RED FLAGS: None   WEIGHT BEARING RESTRICTIONS: No  FALLS:  Has patient fallen in last 6 months? Yes. Number of falls 1- not sure exactly what happened, have had other falls in the past due to issues with HTN meds   LIVING ENVIRONMENT: Lives with: lives with their spouse Lives in: House/apartment   OCCUPATION: retired  PLOF: Independent, Independent with basic ADLs, Independent with gait, and Independent with transfers  PATIENT GOALS: get rid of pain, learn how to manage it   NEXT MD VISIT: Referring in 6 weeks   OBJECTIVE:  Note: Objective measures were completed at Evaluation unless otherwise noted.   PATIENT SURVEYS:   Patient-Specific Activity Scoring Scheme  "0" represents "unable to perform." "10" represents "able to perform at prior level. 0 1 2 3 4 5 6 7 8 9  10 (Date and Score)   Activity Eval     1. Standing for >10 minutes  3    2. Walking longer distances   8    3.  Balance skills  7   4.    5.    Score 6    Total score = sum of the activity scores/number of activities Minimum detectable change (90%CI) for average score =  2 points Minimum detectable change (90%CI) for single activity score = 3 points     COGNITION: Overall cognitive status: Within functional limits for tasks assessed      MUSCLE LENGTH:  HS severe limitation B, piriformis mild limitation B, hip flexors mod  limitation B     POSTURE: rounded shoulders and forward head  PALPATION:  Low back very tight, multiple mm spasms noted   LUMBAR ROM:   AROM eval  Flexion Severe limitation   Extension Moderate limitation  Right lateral flexion WNL   Left lateral flexion WNL  Right rotation WNL   Left rotation WNL    (Blank rows = not tested)   LOWER EXTREMITY MMT:    MMT Right eval Left eval  Hip flexion 4 4  Hip extension    Hip abduction 4+ 4+  Hip adduction    Hip internal rotation    Hip external rotation    Knee flexion 4- 4-  Knee extension 4 4  Ankle dorsiflexion    Ankle plantarflexion    Ankle inversion    Ankle eversion     (Blank rows = not tested)  LUMBAR SPECIAL TESTS:  Straight leg raise test: Negative  FUNCTIONAL TESTS:  Dynamic Gait Index: 21/24     TREATMENT DATE:  06/26/23 NuStep L5x32mins  Calf stretch 30s x2 Lateral step ups 6"  Leg ext 10# 2x10 HS curls 25# 2x10 On airex cone taps  Tandem hold on airex  STS on airex x10, then with OHP x10 Catch on airex pad   06/24/23 Bike L3 x88mins  SLS, tandem holds  On airex reaching Side steps onto airex  Step ups 6"  LE stretching- HS, piriformis, glutes, SKTC  Feet on pball rotations and knees to chest x10  06/21/23 NuStep L5 x74mins Shoulder ext 10# 2x10  Seated row 25# 2x10 Lat pull down 25# 2x10  STS to OHP 2x10 Walking on beam   06/13/23 NuStep L4 Pallof press 10# x10 each side Split stance on 8in box + head swivels 10# shoulder ext, Rows x10 each 6in box taps Sit to stand on airex x10 Bridges 2x10 Feet on ball   Side to side x10  KTC x10 HS, KTC,  piriformis stretch Educate on log rolling    06/05/23  Nustep L5x8 minutes seat 10 all four extremities   HS stretches 2x30 seconds hooklying with strap Supine figure 4 stretch 2x30 seconds B  Lumbar rotation 5x3 seconds B  Bridges + ABD into red TB 12x2 seconds  Sidelying clams red TB x10 B Hip flexor stretch 1x30 seconds B   PPT 12x3 seconds supine PPT + march x10 B      06/03/23  Eval, HEP, POC                     HEP practice as below  PATIENT EDUCATION:  Education details: exam findings, POC, HEP  Person educated: Patient Education method: Explanation, Demonstration, and Handouts Education comprehension: verbalized understanding, returned demonstration, and needs further education  HOME EXERCISE PROGRAM: Access Code: [email or web address] URL: [email or web address] Date: 06/03/2023 Prepared by: Terrel Ferries  Exercises - Supine Single Knee to Chest Stretch  - 2 x daily - 7 x weekly - 1 sets - 5 reps - 5 seconds  hold - Supine Lower Trunk Rotation  - 2 x daily - 7 x weekly - 1 sets - 5 reps - 5 seconds  hold - Hooklying Hamstring Stretch with Strap  - 2 x daily - 7 x weekly - 1 sets - 3 reps - 30 seconds  hold - Supine Transversus Abdominis Bracing - Hands on Stomach  - 5 x daily - 7 x weekly - 1 sets - 10 reps - 3 seconds  hold  ASSESSMENT:  CLINICAL IMPRESSION: Pt arrives doing well, comes in today with no pain or stiffness. We worked on mostly LE strengthening today on machines and functional tasks. Also focused on his balance, with static and dynamic interventions. Needs light touch assistance with cone taps and tandem hold on airex. He would benefit from further PT to strengthen core, improve balance, and improve self management of his condition.   EVAL: Patient is a 78 y.o. M who was seen today for physical therapy evaluation and treatment for LBP. Objectives as above, I think that a lot of his symptoms are related to general deconditioning, joint stiffness, and mm tightness related to sedentary lifestyle. He is mildly unsteady but not too bad. Anticipate that he will benefit from skilled PT services moving forward.    OBJECTIVE IMPAIRMENTS: decreased activity tolerance,  decreased balance, decreased mobility, difficulty walking, decreased ROM, decreased strength, increased fascial restrictions, increased muscle spasms, impaired flexibility, postural dysfunction, and pain.   ACTIVITY LIMITATIONS: carrying, lifting, standing, squatting, transfers, bed mobility, and locomotion level  PARTICIPATION LIMITATIONS: meal prep, cleaning, shopping, community activity, and yard work  PERSONAL FACTORS: Age, Education, Fitness, Past/current experiences, and Time since onset of injury/illness/exacerbation are also affecting patient's functional outcome.   REHAB POTENTIAL: Good  CLINICAL DECISION MAKING: Stable/uncomplicated  EVALUATION COMPLEXITY: Low   GOALS: Goals reviewed with patient? No  SHORT TERM GOALS: Target date: 06/24/2023    Will be compliant with appropriate progressive HEP GOAL STATUS: Initial   2. Will demonstrate improved postural awareness with all functional tasks, use of ergonomic aides PRN/as desired GOAL STATUS: Initial   3. Will demonstrate good functional biomechanics for bed mobility and floor to waist lifting mechanics  GOAL STATUS: Initial   4. Lumbar flexion and extension AROM to be no more than 25% limited  GOAL STATUS: Initial    LONG TERM GOALS: Target date: 07/15/2023    MMT to have improved by one grade all weak groups GOAL STATUS: Initial  2. Mm flexibility and spasms to have improved by at least 50% in order to improve functional movement patterns and for pain control GOAL STATUS: Initial  3. Pain to be no more than 1/10 at worst with all functional activities GOAL STATUS: Initial   4. Will have improved score on PSFS by at least 2 points to show improved QOL and subjective perception of condition  GOAL STATUS: Initial    PLAN:  PT FREQUENCY: 2x/week  PT DURATION: 6 weeks  PLANNED INTERVENTIONS: 97750- Physical Performance Testing, 97110-Therapeutic exercises, 97530- Therapeutic activity, [id number]- Neuromuscular  re-education, 97535- Self Care, 16109- Manual therapy, [id number]-  Aquatic Therapy, [id number]- Ultrasound, 16109- Ionotophoresis 4mg /ml Dexamethasone , Taping, and Dry Needling.  PLAN FOR NEXT SESSION: hip and LE stretching/mobility, core strength, manual/DN as desired, assess and progress,   Donavon Fudge, PT, DPT 06/26/23 9:33 AM

## 2023-06-26 ENCOUNTER — Ambulatory Visit

## 2023-06-26 DIAGNOSIS — R2681 Unsteadiness on feet: Secondary | ICD-10-CM

## 2023-06-26 DIAGNOSIS — M6281 Muscle weakness (generalized): Secondary | ICD-10-CM

## 2023-06-26 DIAGNOSIS — R29898 Other symptoms and signs involving the musculoskeletal system: Secondary | ICD-10-CM

## 2023-06-26 DIAGNOSIS — H5340 Unspecified visual field defects: Secondary | ICD-10-CM | POA: Diagnosis not present

## 2023-06-26 DIAGNOSIS — H40021 Open angle with borderline findings, high risk, right eye: Secondary | ICD-10-CM | POA: Diagnosis not present

## 2023-06-26 DIAGNOSIS — H40022 Open angle with borderline findings, high risk, left eye: Secondary | ICD-10-CM | POA: Diagnosis not present

## 2023-06-27 DIAGNOSIS — H353132 Nonexudative age-related macular degeneration, bilateral, intermediate dry stage: Secondary | ICD-10-CM | POA: Diagnosis not present

## 2023-06-27 DIAGNOSIS — H35373 Puckering of macula, bilateral: Secondary | ICD-10-CM | POA: Diagnosis not present

## 2023-06-27 DIAGNOSIS — H33022 Retinal detachment with multiple breaks, left eye: Secondary | ICD-10-CM | POA: Diagnosis not present

## 2023-06-27 DIAGNOSIS — H04123 Dry eye syndrome of bilateral lacrimal glands: Secondary | ICD-10-CM | POA: Diagnosis not present

## 2023-06-27 DIAGNOSIS — H40023 Open angle with borderline findings, high risk, bilateral: Secondary | ICD-10-CM | POA: Diagnosis not present

## 2023-06-27 DIAGNOSIS — H43811 Vitreous degeneration, right eye: Secondary | ICD-10-CM | POA: Diagnosis not present

## 2023-06-27 DIAGNOSIS — H33311 Horseshoe tear of retina without detachment, right eye: Secondary | ICD-10-CM | POA: Diagnosis not present

## 2023-07-02 ENCOUNTER — Ambulatory Visit: Admitting: Physical Therapy

## 2023-07-02 ENCOUNTER — Encounter: Payer: Self-pay | Admitting: Physical Therapy

## 2023-07-02 DIAGNOSIS — M6281 Muscle weakness (generalized): Secondary | ICD-10-CM | POA: Diagnosis not present

## 2023-07-02 DIAGNOSIS — M5459 Other low back pain: Secondary | ICD-10-CM

## 2023-07-02 DIAGNOSIS — R2681 Unsteadiness on feet: Secondary | ICD-10-CM

## 2023-07-02 DIAGNOSIS — R29898 Other symptoms and signs involving the musculoskeletal system: Secondary | ICD-10-CM

## 2023-07-02 NOTE — Therapy (Signed)
 OUTPATIENT PHYSICAL THERAPY THORACOLUMBAR TREATMENT    Patient Name: Jeffrey Stein MRN: [id number] DOB:1945/09/29, 78 y.o., male Today's Date: 07/02/2023  END OF SESSION:  PT End of Session - 07/02/23 1204     Visit Number 7    Number of Visits 13    Date for PT Re-Evaluation 07/15/23    Authorization Type [employer] MCR    Authorization Time Period 06/03/23 to 07/29/23    Progress Note Due on Visit 10    PT Start Time 1149    PT Stop Time 1228    PT Time Calculation (min) 39 min    Activity Tolerance Patient tolerated treatment well    Behavior During Therapy [facility] for tasks assessed/performed                  Past Medical History:  Diagnosis Date   Abnormal liver enzymes    Allergic rhinitis    Allergy    seasonal and environmental   CAD (coronary artery disease)    Cataract    Colon polyps    Diverticulosis    ED (erectile dysfunction)    GERD (gastroesophageal reflux disease)    Heart murmur    20 yrs ago   Hemochromatosis    HTN (hypertension)    Hyperlipemia    Meniere's disease    Prostate cancer (HCC)    Retinal detachment    Past Surgical History:  Procedure Laterality Date   APPENDECTOMY     BASAL CELL CARCINOMA EXCISION  2014   behind right knee and back   cataracts      COLONOSCOPY  2020   COLONOSCOPY WITH PROPOFOL  N/A 09/23/2018   Procedure: COLONOSCOPY WITH PROPOFOL ;  Surgeon: Albertina Hugger, MD;  Location: [facility];  Service: Gastroenterology;  Laterality: N/A;   COLONOSCOPY WITH PROPOFOL  N/A 10/26/2019   Procedure: COLONOSCOPY WITH PROPOFOL ;  Surgeon: Albertina Hugger, MD;  Location: [facility];  Service: Gastroenterology;  Laterality: N/A;   HEMOSTASIS CLIP PLACEMENT  09/23/2018   Procedure: HEMOSTASIS CLIP PLACEMENT;  Surgeon: Albertina Hugger, MD;  Location: [facility];  Service: Gastroenterology;;   HOT HEMOSTASIS N/A 09/23/2018   Procedure: HOT HEMOSTASIS (ARGON PLASMA COAGULATION/BICAP);  Surgeon: Albertina Hugger, MD;   Location: [facility];  Service: Gastroenterology;  Laterality: N/A;   HOT HEMOSTASIS N/A 10/26/2019   Procedure: HOT HEMOSTASIS (ARGON PLASMA COAGULATION/BICAP);  Surgeon: Albertina Hugger, MD;  Location: [facility];  Service: Gastroenterology;  Laterality: N/A;   POLYPECTOMY     POLYPECTOMY  09/23/2018   Procedure: POLYPECTOMY;  Surgeon: Albertina Hugger, MD;  Location: [facility];  Service: Gastroenterology;;   POLYPECTOMY  10/26/2019   Procedure: POLYPECTOMY;  Surgeon: Albertina Hugger, MD;  Location: [facility];  Service: Gastroenterology;;   PROSTATECTOMY     RETINAL DETACHMENT SURGERY     SUBMUCOSAL LIFTING INJECTION  09/23/2018   Procedure: SUBMUCOSAL LIFTING INJECTION;  Surgeon: Albertina Hugger, MD;  Location: [facility];  Service: Gastroenterology;;   tripple bypass N/A 12/07/2015   Patient Active Problem List   Diagnosis Date Noted   B12 deficiency 06/30/2021   Healthcare maintenance 06/30/2021   Hypotension due to drugs 11/24/2019   Elevated LDL cholesterol level 11/24/2019   History of colonic polyps    Cecal polyp    Motion sickness 09/16/2017   Primary insomnia 04/29/2017   Eczema 04/29/2017   Colon polyp 10/30/2016   Statin intolerance 04/26/2015   Hyperlipidemia 09/24/2014  Aortic stenosis 09/24/2014   Essential hypertension 03/09/2013   CA of prostate (HCC) 02/13/2013   Abnormal prostate specific antigen 09/13/2011    PCP: Tonna Frederic MD   REFERRING PROVIDER: Dayne Even, MD  REFERRING DIAG: LBP   Rationale for Evaluation and Treatment: Rehabilitation  THERAPY DIAG:  Unsteadiness on feet  Muscle weakness (generalized)  Other symptoms and signs involving the musculoskeletal system  Other low back pain  ONSET DATE: chronic- years   SUBJECTIVE:                                                                                                                                                                                            SUBJECTIVE STATEMENT:  Back is doing better, started doing some balance stuff over the weekend and its bad. Back was a little achey over the weekend but OK now. Very surprised at how off my balance is in general and I generally feel unsteady.     EVAL: My back will start to hurt after standing for about 5 minutes or more. I was walking the other day and noticed that I also had a pain in the back of my thigh. Felt that same pain in the back of my thigh again yesterday. Have some pain up at the top of my back too. Leg/upper back/lower back pain all feel like separate issues, no shooting pains or sciatica that I know of. PA put me on Meloxicam recently. I've had this for years but it comes on faster now. I've been pretty sedentary but trying to be more active, started walking.   PERTINENT HISTORY:  See above   PAIN:  Are you having pain? No 0/10  now   PRECAUTIONS: None  RED FLAGS: None   WEIGHT BEARING RESTRICTIONS: No  FALLS:  Has patient fallen in last 6 months? Yes. Number of falls 1- not sure exactly what happened, have had other falls in the past due to issues with HTN meds   LIVING ENVIRONMENT: Lives with: lives with their spouse Lives in: House/apartment   OCCUPATION: retired  PLOF: Independent, Independent with basic ADLs, Independent with gait, and Independent with transfers  PATIENT GOALS: get rid of pain, learn how to manage it   NEXT MD VISIT: Referring in 6 weeks   OBJECTIVE:  Note: Objective measures were completed at Evaluation unless otherwise noted.   PATIENT SURVEYS:   Patient-Specific Activity Scoring Scheme  "0" represents "unable to perform." "10" represents "able to perform at prior level. 0 1 2 3 4 5 6 7 8 9  10 (Date and Score)   Activity Eval  07/02/23  1. Standing for >10 minutes  3 6   2. Walking longer distances   8 9   3.  Balance skills  7 8  4.    5.    Score 6 7.6   Total score = sum of the activity scores/number of  activities Minimum detectable change (90%CI) for average score = 2 points Minimum detectable change (90%CI) for single activity score = 3 points     COGNITION: Overall cognitive status: Within functional limits for tasks assessed      MUSCLE LENGTH:  HS severe limitation B, piriformis mild limitation B, hip flexors mod limitation B     POSTURE: rounded shoulders and forward head  PALPATION:  Low back very tight, multiple mm spasms noted   LUMBAR ROM:   AROM eval 07/02/23  Flexion Severe limitation  Mod limitation   Extension Moderate limitation Min limitation   Right lateral flexion WNL    Left lateral flexion WNL   Right rotation WNL    Left rotation WNL     (Blank rows = not tested)   LOWER EXTREMITY MMT:    MMT Right eval Left eval  Hip flexion 4 4  Hip extension    Hip abduction 4+ 4+  Hip adduction    Hip internal rotation    Hip external rotation    Knee flexion 4- 4-  Knee extension 4 4  Ankle dorsiflexion    Ankle plantarflexion    Ankle inversion    Ankle eversion     (Blank rows = not tested)  LUMBAR SPECIAL TESTS:  Straight leg raise test: Negative  FUNCTIONAL TESTS:  Dynamic Gait Index: 21/24     07/02/23 0001  Dynamic Gait Index  Level Surface 3  Change in Gait Speed 3  Gait with Horizontal Head Turns 2  Gait with Vertical Head Turns 2  Gait and Pivot Turn 2  Step Over Obstacle 3  Step Around Obstacles 2  Steps 2  Total Score 19    TREATMENT DATE:    07/02/23   Lumbar ROM, PSFS, biomechanics check, DGI, STG update and added balance LTG, education on POC moving forward   One foot on BOSU/one on solid surface 3x30 seconds B Tandem stance blue foam pad 3x30 seconds B Tandem walks blue foam pad x3 laps Sidesteps blue foam pad x4 laps  Nustep L5x8 minutes BLEs only    06/26/23 NuStep L5x17mins  Calf stretch 30s x2 Lateral step ups 6"  Leg ext 10# 2x10 HS curls 25# 2x10 On airex cone taps  Tandem hold on airex  STS  on airex x10, then with OHP x10 Catch on airex pad                                                                                                               PATIENT EDUCATION:  Education details: exam findings, POC, HEP  Person educated: Patient Education method: Programmer, multimedia, Demonstration, and Handouts Education comprehension: verbalized understanding, returned demonstration, and needs further education  HOME EXERCISE PROGRAM: Access  Code: [email or web address] URL: [email or web address] Date: 06/03/2023 Prepared by: Terrel Ferries  Exercises - Supine Single Knee to Chest Stretch  - 2 x daily - 7 x weekly - 1 sets - 5 reps - 5 seconds  hold - Supine Lower Trunk Rotation  - 2 x daily - 7 x weekly - 1 sets - 5 reps - 5 seconds  hold - Hooklying Hamstring Stretch with Strap  - 2 x daily - 7 x weekly - 1 sets - 3 reps - 30 seconds  hold - Supine Transversus Abdominis Bracing - Hands on Stomach  - 5 x daily - 7 x weekly - 1 sets - 10 reps - 3 seconds  hold  ASSESSMENT:  CLINICAL IMPRESSION:  Checked STGs and updated LTGs somewhat today too- he is definitely making progress but has growing concern about his balance. Added balance LTG today, and worked on balance per his request but did educate that he would likely benefit from continuation of work on Psychologist, sport and exercise as well.    EVAL: Patient is a 78 y.o. M who was seen today for physical therapy evaluation and treatment for LBP. Objectives as above, I think that a lot of his symptoms are related to general deconditioning, joint stiffness, and mm tightness related to sedentary lifestyle. He is mildly unsteady but not too bad. Anticipate that he will benefit from skilled PT services moving forward.    OBJECTIVE IMPAIRMENTS: decreased activity tolerance, decreased balance, decreased mobility, difficulty walking, decreased ROM, decreased strength, increased fascial restrictions, increased muscle spasms, impaired flexibility, postural  dysfunction, and pain.   ACTIVITY LIMITATIONS: carrying, lifting, standing, squatting, transfers, bed mobility, and locomotion level  PARTICIPATION LIMITATIONS: meal prep, cleaning, shopping, community activity, and yard work  PERSONAL FACTORS: Age, Education, Fitness, Past/current experiences, and Time since onset of injury/illness/exacerbation are also affecting patient's functional outcome.   REHAB POTENTIAL: Good  CLINICAL DECISION MAKING: Stable/uncomplicated  EVALUATION COMPLEXITY: Low   GOALS: Goals reviewed with patient? No  SHORT TERM GOALS: Target date: 06/24/2023    Will be compliant with appropriate progressive HEP GOAL STATUS: PARTIALLY MET 07/02/23 4/7 days and progressing    2. Will demonstrate improved postural awareness with all functional tasks, use of ergonomic aides PRN/as desired GOAL STATUS: ONGOING 07/02/23   3. Will demonstrate good functional biomechanics for bed mobility and floor to waist lifting mechanics  GOAL STATUS: ONGOING 07/02/23  4. Lumbar flexion and extension AROM to be no more than 25% limited  GOAL STATUS: ONGOING 07/02/23   LONG TERM GOALS: Target date: 07/15/2023    MMT to have improved by one grade all weak groups GOAL STATUS: Initial  2. Mm flexibility and spasms to have improved by at least 50% in order to improve functional movement patterns and for pain control GOAL STATUS: Initial  3. Pain to be no more than 1/10 at worst with all functional activities GOAL STATUS: Initial   4. Will have improved score on PSFS by at least 2 points to show improved QOL and subjective perception of condition  GOAL STATUS: ONGOING 07/02/23  5. Will score at least 22/24 on DGI  GOAL STATUS: New as of 07/02/23   PLAN:  PT FREQUENCY: 2x/week  PT DURATION: 6 weeks  PLANNED INTERVENTIONS: 97750- Physical Performance Testing, 97110-Therapeutic exercises, 97530- Therapeutic activity, [id number]- Neuromuscular re-education, 97535- Self Care, 16109-  Manual therapy, [id number]- Aquatic Therapy, 97035- Ultrasound, [id number]- Ionotophoresis 4mg /ml Dexamethasone , Taping, and Dry Needling.  PLAN FOR NEXT SESSION: hip and LE stretching/mobility, core strength, manual/DN as  desired, balance work. Update HEP next visit   Terrel Ferries, PT, DPT 07/02/23 12:30 PM

## 2023-07-04 ENCOUNTER — Ambulatory Visit: Admitting: Physical Therapy

## 2023-07-04 ENCOUNTER — Encounter: Payer: Self-pay | Admitting: Physical Therapy

## 2023-07-04 DIAGNOSIS — R2681 Unsteadiness on feet: Secondary | ICD-10-CM

## 2023-07-04 DIAGNOSIS — R29898 Other symptoms and signs involving the musculoskeletal system: Secondary | ICD-10-CM

## 2023-07-04 DIAGNOSIS — M6281 Muscle weakness (generalized): Secondary | ICD-10-CM | POA: Diagnosis not present

## 2023-07-04 DIAGNOSIS — M5459 Other low back pain: Secondary | ICD-10-CM

## 2023-07-04 NOTE — Therapy (Signed)
 OUTPATIENT PHYSICAL THERAPY THORACOLUMBAR TREATMENT    Patient Name: Jeffrey Stein MRN: [id number] DOB:1946-01-09, 78 y.o., male Today's Date: 07/04/2023  END OF SESSION:  PT End of Session - 07/04/23 1025     Visit Number 8    Number of Visits 13    Date for PT Re-Evaluation 07/15/23    Authorization Type [employer] MCR    Authorization Time Period 06/03/23 to 07/29/23    Progress Note Due on Visit 10    PT Start Time 1017    PT Stop Time 1057    PT Time Calculation (min) 40 min    Activity Tolerance Patient tolerated treatment well    Behavior During Therapy [facility] for tasks assessed/performed                   Past Medical History:  Diagnosis Date   Abnormal liver enzymes    Allergic rhinitis    Allergy    seasonal and environmental   CAD (coronary artery disease)    Cataract    Colon polyps    Diverticulosis    ED (erectile dysfunction)    GERD (gastroesophageal reflux disease)    Heart murmur    20 yrs ago   Hemochromatosis    HTN (hypertension)    Hyperlipemia    Meniere's disease    Prostate cancer (HCC)    Retinal detachment    Past Surgical History:  Procedure Laterality Date   APPENDECTOMY     BASAL CELL CARCINOMA EXCISION  2014   behind right knee and back   cataracts      COLONOSCOPY  2020   COLONOSCOPY WITH PROPOFOL  N/A 09/23/2018   Procedure: COLONOSCOPY WITH PROPOFOL ;  Surgeon: Albertina Hugger, MD;  Location: [facility];  Service: Gastroenterology;  Laterality: N/A;   COLONOSCOPY WITH PROPOFOL  N/A 10/26/2019   Procedure: COLONOSCOPY WITH PROPOFOL ;  Surgeon: Albertina Hugger, MD;  Location: [facility];  Service: Gastroenterology;  Laterality: N/A;   HEMOSTASIS CLIP PLACEMENT  09/23/2018   Procedure: HEMOSTASIS CLIP PLACEMENT;  Surgeon: Albertina Hugger, MD;  Location: [facility];  Service: Gastroenterology;;   HOT HEMOSTASIS N/A 09/23/2018   Procedure: HOT HEMOSTASIS (ARGON PLASMA COAGULATION/BICAP);  Surgeon: Albertina Hugger,  MD;  Location: [facility];  Service: Gastroenterology;  Laterality: N/A;   HOT HEMOSTASIS N/A 10/26/2019   Procedure: HOT HEMOSTASIS (ARGON PLASMA COAGULATION/BICAP);  Surgeon: Albertina Hugger, MD;  Location: [facility];  Service: Gastroenterology;  Laterality: N/A;   POLYPECTOMY     POLYPECTOMY  09/23/2018   Procedure: POLYPECTOMY;  Surgeon: Albertina Hugger, MD;  Location: [facility];  Service: Gastroenterology;;   POLYPECTOMY  10/26/2019   Procedure: POLYPECTOMY;  Surgeon: Albertina Hugger, MD;  Location: [facility];  Service: Gastroenterology;;   PROSTATECTOMY     RETINAL DETACHMENT SURGERY     SUBMUCOSAL LIFTING INJECTION  09/23/2018   Procedure: SUBMUCOSAL LIFTING INJECTION;  Surgeon: Albertina Hugger, MD;  Location: [facility];  Service: Gastroenterology;;   tripple bypass N/A 12/07/2015   Patient Active Problem List   Diagnosis Date Noted   B12 deficiency 06/30/2021   Healthcare maintenance 06/30/2021   Hypotension due to drugs 11/24/2019   Elevated LDL cholesterol level 11/24/2019   History of colonic polyps    Cecal polyp    Motion sickness 09/16/2017   Primary insomnia 04/29/2017   Eczema 04/29/2017   Colon polyp 10/30/2016   Statin intolerance 04/26/2015   Hyperlipidemia 09/24/2014  Aortic stenosis 09/24/2014   Essential hypertension 03/09/2013   CA of prostate (HCC) 02/13/2013   Abnormal prostate specific antigen 09/13/2011    PCP: Tonna Frederic MD   REFERRING PROVIDER: Dayne Even, MD  REFERRING DIAG: LBP   Rationale for Evaluation and Treatment: Rehabilitation  THERAPY DIAG:  Unsteadiness on feet  Muscle weakness (generalized)  Other symptoms and signs involving the musculoskeletal system  Other low back pain  ONSET DATE: chronic- years   SUBJECTIVE:                                                                                                                                                                                            SUBJECTIVE STATEMENT:  Nothing new, back is feeling OK     EVAL: My back will start to hurt after standing for about 5 minutes or more. I was walking the other day and noticed that I also had a pain in the back of my thigh. Felt that same pain in the back of my thigh again yesterday. Have some pain up at the top of my back too. Leg/upper back/lower back pain all feel like separate issues, no shooting pains or sciatica that I know of. PA put me on Meloxicam recently. I've had this for years but it comes on faster now. I've been pretty sedentary but trying to be more active, started walking.   PERTINENT HISTORY:  See above   PAIN:  Are you having pain? No 0/10  today   PRECAUTIONS: None  RED FLAGS: None   WEIGHT BEARING RESTRICTIONS: No  FALLS:  Has patient fallen in last 6 months? Yes. Number of falls 1- not sure exactly what happened, have had other falls in the past due to issues with HTN meds   LIVING ENVIRONMENT: Lives with: lives with their spouse Lives in: House/apartment   OCCUPATION: retired  PLOF: Independent, Independent with basic ADLs, Independent with gait, and Independent with transfers  PATIENT GOALS: get rid of pain, learn how to manage it   NEXT MD VISIT: Referring in 6 weeks   OBJECTIVE:  Note: Objective measures were completed at Evaluation unless otherwise noted.   PATIENT SURVEYS:   Patient-Specific Activity Scoring Scheme  "0" represents "unable to perform." "10" represents "able to perform at prior level. 0 1 2 3 4 5 6 7 8 9  10 (Date and Score)   Activity Eval  07/02/23   1. Standing for >10 minutes  3 6   2. Walking longer distances   8 9   3.  Balance skills  7 8  4.    5.  Score 6 7.6   Total score = sum of the activity scores/number of activities Minimum detectable change (90%CI) for average score = 2 points Minimum detectable change (90%CI) for single activity score = 3 points     COGNITION: Overall cognitive  status: Within functional limits for tasks assessed      MUSCLE LENGTH:  HS severe limitation B, piriformis mild limitation B, hip flexors mod limitation B     POSTURE: rounded shoulders and forward head  PALPATION:  Low back very tight, multiple mm spasms noted   LUMBAR ROM:   AROM eval 07/02/23  Flexion Severe limitation  Mod limitation   Extension Moderate limitation Min limitation   Right lateral flexion WNL    Left lateral flexion WNL   Right rotation WNL    Left rotation WNL     (Blank rows = not tested)   LOWER EXTREMITY MMT:    MMT Right eval Left eval  Hip flexion 4 4  Hip extension    Hip abduction 4+ 4+  Hip adduction    Hip internal rotation    Hip external rotation    Knee flexion 4- 4-  Knee extension 4 4  Ankle dorsiflexion    Ankle plantarflexion    Ankle inversion    Ankle eversion     (Blank rows = not tested)  LUMBAR SPECIAL TESTS:  Straight leg raise test: Negative  FUNCTIONAL TESTS:  Dynamic Gait Index: 21/24     07/02/23 0001  Dynamic Gait Index  Level Surface 3  Change in Gait Speed 3  Gait with Horizontal Head Turns 2  Gait with Vertical Head Turns 2  Gait and Pivot Turn 2  Step Over Obstacle 3  Step Around Obstacles 2  Steps 2  Total Score 19    TREATMENT DATE:    07/04/23  Nustep L5x8 minutes seat 11 all four extremities   Bridges + ABD into green TB x12 Sidelying clams green TB x12 B  PPT 12x3 seconds  Tandem stance blue foam pad 3x30 seconds B  Narrow BOS blue foam pad EC 3x30 seconds  Forward/backward step overs blue foam pad longwise x10 B intermittent UE touches    07/02/23   Lumbar ROM, PSFS, biomechanics check, DGI, STG update and added balance LTG, education on POC moving forward   One foot on BOSU/one on solid surface 3x30 seconds B Tandem stance blue foam pad 3x30 seconds B Tandem walks blue foam pad x3 laps Sidesteps blue foam pad x4 laps  Nustep L5x8 minutes BLEs only    06/26/23 NuStep  L5x26mins  Calf stretch 30s x2 Lateral step ups 6"  Leg ext 10# 2x10 HS curls 25# 2x10 On airex cone taps  Tandem hold on airex  STS on airex x10, then with OHP x10 Catch on airex pad                                                                                                               PATIENT EDUCATION:  Education details: exam findings,  POC, HEP  Person educated: Patient Education method: Explanation, Demonstration, and Handouts Education comprehension: verbalized understanding, returned demonstration, and needs further education  HOME EXERCISE PROGRAM:  Access Code: [email or web address] URL: [email or web address] Date: 07/04/2023 Prepared by: Terrel Ferries  Exercises - Supine Single Knee to Chest Stretch  - 2 x daily - 7 x weekly - 1 sets - 5 reps - 5 seconds  hold - Supine Lower Trunk Rotation  - 2 x daily - 7 x weekly - 1 sets - 5 reps - 5 seconds  hold - Hooklying Hamstring Stretch with Strap  - 2 x daily - 7 x weekly - 1 sets - 3 reps - 30 seconds  hold - Supine Transversus Abdominis Bracing - Hands on Stomach  - 5 x daily - 7 x weekly - 1 sets - 10 reps - 3 seconds  hold - Supine Bridge with Resistance Band  - 1 x daily - 5 x weekly - 2 sets - 10 reps - 2 seconds  hold - Clamshell with Resistance  - 1 x daily - 5 x weekly - 2 sets - 10 reps - 1 second  hold - Supine Posterior Pelvic Tilt  - 1 x daily - 7 x weekly - 2 sets - 10 reps - 3 seconds  hold    ASSESSMENT:  CLINICAL IMPRESSION:  Doing OK today, we worked a bit more on core strength and continued balance as per POC. Updated HEP as well. Will continue to progress as appropriate and tolerated.    EVAL: Patient is a 78 y.o. M who was seen today for physical therapy evaluation and treatment for LBP. Objectives as above, I think that a lot of his symptoms are related to general deconditioning, joint stiffness, and mm tightness related to sedentary lifestyle. He is mildly unsteady but not too bad.  Anticipate that he will benefit from skilled PT services moving forward.    OBJECTIVE IMPAIRMENTS: decreased activity tolerance, decreased balance, decreased mobility, difficulty walking, decreased ROM, decreased strength, increased fascial restrictions, increased muscle spasms, impaired flexibility, postural dysfunction, and pain.   ACTIVITY LIMITATIONS: carrying, lifting, standing, squatting, transfers, bed mobility, and locomotion level  PARTICIPATION LIMITATIONS: meal prep, cleaning, shopping, community activity, and yard work  PERSONAL FACTORS: Age, Education, Fitness, Past/current experiences, and Time since onset of injury/illness/exacerbation are also affecting patient's functional outcome.   REHAB POTENTIAL: Good  CLINICAL DECISION MAKING: Stable/uncomplicated  EVALUATION COMPLEXITY: Low   GOALS: Goals reviewed with patient? No  SHORT TERM GOALS: Target date: 06/24/2023    Will be compliant with appropriate progressive HEP GOAL STATUS: PARTIALLY MET 07/02/23 4/7 days and progressing    2. Will demonstrate improved postural awareness with all functional tasks, use of ergonomic aides PRN/as desired GOAL STATUS: ONGOING 07/02/23   3. Will demonstrate good functional biomechanics for bed mobility and floor to waist lifting mechanics  GOAL STATUS: ONGOING 07/02/23  4. Lumbar flexion and extension AROM to be no more than 25% limited  GOAL STATUS: ONGOING 07/02/23   LONG TERM GOALS: Target date: 07/15/2023    MMT to have improved by one grade all weak groups GOAL STATUS: Initial  2. Mm flexibility and spasms to have improved by at least 50% in order to improve functional movement patterns and for pain control GOAL STATUS: Initial  3. Pain to be no more than 1/10 at worst with all functional activities GOAL STATUS: Initial   4. Will have improved score on PSFS by at least 2 points to show improved  QOL and subjective perception of condition  GOAL STATUS: ONGOING  07/02/23  5. Will score at least 22/24 on DGI  GOAL STATUS: New as of 07/02/23   PLAN:  PT FREQUENCY: 2x/week  PT DURATION: 6 weeks  PLANNED INTERVENTIONS: 97750- Physical Performance Testing, 97110-Therapeutic exercises, 97530- Therapeutic activity, [id number]- Neuromuscular re-education, 97535- Self Care, 25956- Manual therapy, [id number]- Aquatic Therapy, 97035- Ultrasound, [id number]- Ionotophoresis 4mg /ml Dexamethasone , Taping, and Dry Needling.  PLAN FOR NEXT SESSION: hip and LE stretching/mobility, core strength, manual/DN as desired, balance work.    Terrel Ferries, PT, DPT 07/04/23 10:58 AM

## 2023-07-05 NOTE — Therapy (Signed)
 OUTPATIENT PHYSICAL THERAPY THORACOLUMBAR TREATMENT    Patient Name: Jeffrey Stein MRN: [id number] DOB:17-Dec-1945, 78 y.o., male Today's Date: 07/08/2023  END OF SESSION:  PT End of Session - 07/08/23 1059     Visit Number 9    Number of Visits 13    Date for PT Re-Evaluation 07/25/23    Authorization Type [employer] MCR    Authorization Time Period 06/03/23 to 07/29/23    Progress Note Due on Visit 10    PT Start Time 1100    PT Stop Time 1145    PT Time Calculation (min) 45 min    Activity Tolerance Patient tolerated treatment well    Behavior During Therapy [facility] for tasks assessed/performed                    Past Medical History:  Diagnosis Date   Abnormal liver enzymes    Allergic rhinitis    Allergy    seasonal and environmental   CAD (coronary artery disease)    Cataract    Colon polyps    Diverticulosis    ED (erectile dysfunction)    GERD (gastroesophageal reflux disease)    Heart murmur    20 yrs ago   Hemochromatosis    HTN (hypertension)    Hyperlipemia    Meniere's disease    Prostate cancer (HCC)    Retinal detachment    Past Surgical History:  Procedure Laterality Date   APPENDECTOMY     BASAL CELL CARCINOMA EXCISION  2014   behind right knee and back   cataracts      COLONOSCOPY  2020   COLONOSCOPY WITH PROPOFOL  N/A 09/23/2018   Procedure: COLONOSCOPY WITH PROPOFOL ;  Surgeon: Albertina Hugger, MD;  Location: [facility];  Service: Gastroenterology;  Laterality: N/A;   COLONOSCOPY WITH PROPOFOL  N/A 10/26/2019   Procedure: COLONOSCOPY WITH PROPOFOL ;  Surgeon: Albertina Hugger, MD;  Location: [facility];  Service: Gastroenterology;  Laterality: N/A;   HEMOSTASIS CLIP PLACEMENT  09/23/2018   Procedure: HEMOSTASIS CLIP PLACEMENT;  Surgeon: Albertina Hugger, MD;  Location: [facility];  Service: Gastroenterology;;   HOT HEMOSTASIS N/A 09/23/2018   Procedure: HOT HEMOSTASIS (ARGON PLASMA COAGULATION/BICAP);  Surgeon: Albertina Hugger,  MD;  Location: [facility];  Service: Gastroenterology;  Laterality: N/A;   HOT HEMOSTASIS N/A 10/26/2019   Procedure: HOT HEMOSTASIS (ARGON PLASMA COAGULATION/BICAP);  Surgeon: Albertina Hugger, MD;  Location: [facility];  Service: Gastroenterology;  Laterality: N/A;   POLYPECTOMY     POLYPECTOMY  09/23/2018   Procedure: POLYPECTOMY;  Surgeon: Albertina Hugger, MD;  Location: [facility];  Service: Gastroenterology;;   POLYPECTOMY  10/26/2019   Procedure: POLYPECTOMY;  Surgeon: Albertina Hugger, MD;  Location: [facility];  Service: Gastroenterology;;   PROSTATECTOMY     RETINAL DETACHMENT SURGERY     SUBMUCOSAL LIFTING INJECTION  09/23/2018   Procedure: SUBMUCOSAL LIFTING INJECTION;  Surgeon: Albertina Hugger, MD;  Location: [facility];  Service: Gastroenterology;;   tripple bypass N/A 12/07/2015   Patient Active Problem List   Diagnosis Date Noted   B12 deficiency 06/30/2021   Healthcare maintenance 06/30/2021   Hypotension due to drugs 11/24/2019   Elevated LDL cholesterol level 11/24/2019   History of colonic polyps    Cecal polyp    Motion sickness 09/16/2017   Primary insomnia 04/29/2017   Eczema 04/29/2017   Colon polyp 10/30/2016   Statin intolerance 04/26/2015   Hyperlipidemia 09/24/2014  Aortic stenosis 09/24/2014   Essential hypertension 03/09/2013   CA of prostate (HCC) 02/13/2013   Abnormal prostate specific antigen 09/13/2011    PCP: Tonna Frederic MD   REFERRING PROVIDER: Dayne Even, MD  REFERRING DIAG: LBP   Rationale for Evaluation and Treatment: Rehabilitation  THERAPY DIAG:  Unsteadiness on feet  Muscle weakness (generalized)  Other symptoms and signs involving the musculoskeletal system  Other low back pain  ONSET DATE: chronic- years   SUBJECTIVE:                                                                                                                                                                                            SUBJECTIVE STATEMENT:  Back is feeling better since we started but I feel it more in the mornings and it is stiff. Once I get moving it does start to feel better.     EVAL: My back will start to hurt after standing for about 5 minutes or more. I was walking the other day and noticed that I also had a pain in the back of my thigh. Felt that same pain in the back of my thigh again yesterday. Have some pain up at the top of my back too. Leg/upper back/lower back pain all feel like separate issues, no shooting pains or sciatica that I know of. PA put me on Meloxicam recently. I've had this for years but it comes on faster now. I've been pretty sedentary but trying to be more active, started walking.   PERTINENT HISTORY:  See above   PAIN:  Are you having pain? No 0/10  today   PRECAUTIONS: None  RED FLAGS: None   WEIGHT BEARING RESTRICTIONS: No  FALLS:  Has patient fallen in last 6 months? Yes. Number of falls 1- not sure exactly what happened, have had other falls in the past due to issues with HTN meds   LIVING ENVIRONMENT: Lives with: lives with their spouse Lives in: House/apartment   OCCUPATION: retired  PLOF: Independent, Independent with basic ADLs, Independent with gait, and Independent with transfers  PATIENT GOALS: get rid of pain, learn how to manage it   NEXT MD VISIT: Referring in 6 weeks   OBJECTIVE:  Note: Objective measures were completed at Evaluation unless otherwise noted.   PATIENT SURVEYS:   Patient-Specific Activity Scoring Scheme  "0" represents "unable to perform." "10" represents "able to perform at prior level. 0 1 2 3 4 5 6 7 8 9  10 (Date and Score)   Activity Eval  07/02/23   1. Standing for >10 minutes  3 6   2. Walking  longer distances   8 9   3.  Balance skills  7 8  4.    5.    Score 6 7.6   Total score = sum of the activity scores/number of activities Minimum detectable change (90%CI) for average score = 2  points Minimum detectable change (90%CI) for single activity score = 3 points     COGNITION: Overall cognitive status: Within functional limits for tasks assessed      MUSCLE LENGTH:  HS severe limitation B, piriformis mild limitation B, hip flexors mod limitation B     POSTURE: rounded shoulders and forward head  PALPATION:  Low back very tight, multiple mm spasms noted   LUMBAR ROM:   AROM eval 07/02/23  Flexion Severe limitation  Mod limitation   Extension Moderate limitation Min limitation   Right lateral flexion WNL    Left lateral flexion WNL   Right rotation WNL    Left rotation WNL     (Blank rows = not tested)   LOWER EXTREMITY MMT:    MMT Right eval Left eval  Hip flexion 4 4  Hip extension    Hip abduction 4+ 4+  Hip adduction    Hip internal rotation    Hip external rotation    Knee flexion 4- 4-  Knee extension 4 4  Ankle dorsiflexion    Ankle plantarflexion    Ankle inversion    Ankle eversion     (Blank rows = not tested)  LUMBAR SPECIAL TESTS:  Straight leg raise test: Negative  FUNCTIONAL TESTS:  Dynamic Gait Index: 21/24     07/02/23 0001  Dynamic Gait Index  Level Surface 3  Change in Gait Speed 3  Gait with Horizontal Head Turns 2  Gait with Vertical Head Turns 2  Gait and Pivot Turn 2  Step Over Obstacle 3  Step Around Obstacles 2  Steps 2  Total Score 19    TREATMENT DATE:  07/08/23 NuStep L5x23mins  Shoulder ext 10# 2x10 AR press 10# 2x10 Resisted gait 30# 4 way x4  Calf stretch 30s x2 Seated row 25# 2x10 Lat pull down 25# 2x10  Side steps on airex Marching on airex  Step up from airex to stair   07/04/23  Nustep L5x8 minutes seat 11 all four extremities   Bridges + ABD into green TB x12 Sidelying clams green TB x12 B  PPT 12x3 seconds  Tandem stance blue foam pad 3x30 seconds B  Narrow BOS blue foam pad EC 3x30 seconds  Forward/backward step overs blue foam pad longwise x10 B intermittent UE touches     07/02/23   Lumbar ROM, PSFS, biomechanics check, DGI, STG update and added balance LTG, education on POC moving forward   One foot on BOSU/one on solid surface 3x30 seconds B Tandem stance blue foam pad 3x30 seconds B Tandem walks blue foam pad x3 laps Sidesteps blue foam pad x4 laps  Nustep L5x8 minutes BLEs only    06/26/23 NuStep L5x71mins  Calf stretch 30s x2 Lateral step ups 6"  Leg ext 10# 2x10 HS curls 25# 2x10 On airex cone taps  Tandem hold on airex  STS on airex x10, then with OHP x10 Catch on airex pad  PATIENT EDUCATION:  Education details: exam findings, POC, HEP  Person educated: Patient Education method: Explanation, Demonstration, and Handouts Education comprehension: verbalized understanding, returned demonstration, and needs further education  HOME EXERCISE PROGRAM:  Access Code: [email or web address] URL: [email or web address] Date: 07/04/2023 Prepared by: Terrel Ferries  Exercises - Supine Single Knee to Chest Stretch  - 2 x daily - 7 x weekly - 1 sets - 5 reps - 5 seconds  hold - Supine Lower Trunk Rotation  - 2 x daily - 7 x weekly - 1 sets - 5 reps - 5 seconds  hold - Hooklying Hamstring Stretch with Strap  - 2 x daily - 7 x weekly - 1 sets - 3 reps - 30 seconds  hold - Supine Transversus Abdominis Bracing - Hands on Stomach  - 5 x daily - 7 x weekly - 1 sets - 10 reps - 3 seconds  hold - Supine Bridge with Resistance Band  - 1 x daily - 5 x weekly - 2 sets - 10 reps - 2 seconds  hold - Clamshell with Resistance  - 1 x daily - 5 x weekly - 2 sets - 10 reps - 1 second  hold - Supine Posterior Pelvic Tilt  - 1 x daily - 7 x weekly - 2 sets - 10 reps - 3 seconds  hold    ASSESSMENT:  CLINICAL IMPRESSION:  Doing OK today, we continued to work on back and core strengthening as well as some coordination and balance. He seems to be  doing good overall. Balance is steadily improving, will continue to progress as appropriate and tolerated.    EVAL: Patient is a 78 y.o. M who was seen today for physical therapy evaluation and treatment for LBP. Objectives as above, I think that a lot of his symptoms are related to general deconditioning, joint stiffness, and mm tightness related to sedentary lifestyle. He is mildly unsteady but not too bad. Anticipate that he will benefit from skilled PT services moving forward.    OBJECTIVE IMPAIRMENTS: decreased activity tolerance, decreased balance, decreased mobility, difficulty walking, decreased ROM, decreased strength, increased fascial restrictions, increased muscle spasms, impaired flexibility, postural dysfunction, and pain.   ACTIVITY LIMITATIONS: carrying, lifting, standing, squatting, transfers, bed mobility, and locomotion level  PARTICIPATION LIMITATIONS: meal prep, cleaning, shopping, community activity, and yard work  PERSONAL FACTORS: Age, Education, Fitness, Past/current experiences, and Time since onset of injury/illness/exacerbation are also affecting patient's functional outcome.   REHAB POTENTIAL: Good  CLINICAL DECISION MAKING: Stable/uncomplicated  EVALUATION COMPLEXITY: Low   GOALS: Goals reviewed with patient? No  SHORT TERM GOALS: Target date: 06/24/2023    Will be compliant with appropriate progressive HEP GOAL STATUS: PARTIALLY MET 07/02/23 4/7 days and progressing    2. Will demonstrate improved postural awareness with all functional tasks, use of ergonomic aides PRN/as desired GOAL STATUS: ONGOING 07/02/23   3. Will demonstrate good functional biomechanics for bed mobility and floor to waist lifting mechanics  GOAL STATUS: ONGOING 07/02/23  4. Lumbar flexion and extension AROM to be no more than 25% limited  GOAL STATUS: ONGOING 07/02/23   LONG TERM GOALS: Target date: 07/15/2023    MMT to have improved by one grade all weak groups GOAL STATUS:  Initial  2. Mm flexibility and spasms to have improved by at least 50% in order to improve functional movement patterns and for pain control GOAL STATUS: Initial  3. Pain to be no more than 1/10 at worst with all functional activities GOAL STATUS: Initial  4. Will have improved score on PSFS by at least 2 points to show improved QOL and subjective perception of condition  GOAL STATUS: ONGOING 07/02/23  5. Will score at least 22/24 on DGI  Baseline: 19/24 GOAL STATUS: New as of 07/02/23   PLAN:  PT FREQUENCY: 2x/week  PT DURATION: 6 weeks  PLANNED INTERVENTIONS: 97750- Physical Performance Testing, 97110-Therapeutic exercises, 97530- Therapeutic activity, [id number]- Neuromuscular re-education, 97535- Self Care, [id number]- Manual therapy, [id number]- Aquatic Therapy, 97035- Ultrasound, [id number]- Ionotophoresis 4mg /ml Dexamethasone , Taping, and Dry Needling.  PLAN FOR NEXT SESSION: hip and LE stretching/mobility, core strength, manual/DN as desired, balance work.  10th visit PN  Donavon Fudge, PT, DPT 07/08/23 11:40 AM

## 2023-07-08 ENCOUNTER — Ambulatory Visit: Attending: Orthopaedic Surgery

## 2023-07-08 DIAGNOSIS — R2681 Unsteadiness on feet: Secondary | ICD-10-CM | POA: Insufficient documentation

## 2023-07-08 DIAGNOSIS — R29898 Other symptoms and signs involving the musculoskeletal system: Secondary | ICD-10-CM | POA: Insufficient documentation

## 2023-07-08 DIAGNOSIS — M6281 Muscle weakness (generalized): Secondary | ICD-10-CM | POA: Insufficient documentation

## 2023-07-08 DIAGNOSIS — M5459 Other low back pain: Secondary | ICD-10-CM | POA: Diagnosis not present

## 2023-07-09 NOTE — Therapy (Signed)
 OUTPATIENT PHYSICAL THERAPY THORACOLUMBAR TREATMENT  Progress Note Reporting Period 06/03/23 to 07/10/23  See note below for Objective Data and Assessment of Progress/Goals.      Patient Name: Tj Kitchings MRN: [id number] DOB:15-Oct-1945, 78 y.o., male Today's Date: 07/10/2023  END OF SESSION:  PT End of Session - 07/10/23 1054     Visit Number 10    Number of Visits 13    Date for PT Re-Evaluation 07/25/23    Authorization Type [employer] MCR    Authorization Time Period 06/03/23 to 07/29/23    Progress Note Due on Visit 10    PT Start Time 1055    PT Stop Time 1140    PT Time Calculation (min) 45 min    Activity Tolerance Patient tolerated treatment well    Behavior During Therapy [facility] for tasks assessed/performed                     Past Medical History:  Diagnosis Date   Abnormal liver enzymes    Allergic rhinitis    Allergy    seasonal and environmental   CAD (coronary artery disease)    Cataract    Colon polyps    Diverticulosis    ED (erectile dysfunction)    GERD (gastroesophageal reflux disease)    Heart murmur    20 yrs ago   Hemochromatosis    HTN (hypertension)    Hyperlipemia    Meniere's disease    Prostate cancer (HCC)    Retinal detachment    Past Surgical History:  Procedure Laterality Date   APPENDECTOMY     BASAL CELL CARCINOMA EXCISION  2014   behind right knee and back   cataracts      COLONOSCOPY  2020   COLONOSCOPY WITH PROPOFOL  N/A 09/23/2018   Procedure: COLONOSCOPY WITH PROPOFOL ;  Surgeon: Albertina Hugger, MD;  Location: [facility];  Service: Gastroenterology;  Laterality: N/A;   COLONOSCOPY WITH PROPOFOL  N/A 10/26/2019   Procedure: COLONOSCOPY WITH PROPOFOL ;  Surgeon: Albertina Hugger, MD;  Location: [facility];  Service: Gastroenterology;  Laterality: N/A;   HEMOSTASIS CLIP PLACEMENT  09/23/2018   Procedure: HEMOSTASIS CLIP PLACEMENT;  Surgeon: Albertina Hugger, MD;  Location: [facility];  Service:  Gastroenterology;;   HOT HEMOSTASIS N/A 09/23/2018   Procedure: HOT HEMOSTASIS (ARGON PLASMA COAGULATION/BICAP);  Surgeon: Albertina Hugger, MD;  Location: [facility];  Service: Gastroenterology;  Laterality: N/A;   HOT HEMOSTASIS N/A 10/26/2019   Procedure: HOT HEMOSTASIS (ARGON PLASMA COAGULATION/BICAP);  Surgeon: Albertina Hugger, MD;  Location: [facility];  Service: Gastroenterology;  Laterality: N/A;   POLYPECTOMY     POLYPECTOMY  09/23/2018   Procedure: POLYPECTOMY;  Surgeon: Albertina Hugger, MD;  Location: [facility];  Service: Gastroenterology;;   POLYPECTOMY  10/26/2019   Procedure: POLYPECTOMY;  Surgeon: Albertina Hugger, MD;  Location: [facility];  Service: Gastroenterology;;   PROSTATECTOMY     RETINAL DETACHMENT SURGERY     SUBMUCOSAL LIFTING INJECTION  09/23/2018   Procedure: SUBMUCOSAL LIFTING INJECTION;  Surgeon: Albertina Hugger, MD;  Location: [facility];  Service: Gastroenterology;;   tripple bypass N/A 12/07/2015   Patient Active Problem List   Diagnosis Date Noted   B12 deficiency 06/30/2021   Healthcare maintenance 06/30/2021   Hypotension due to drugs 11/24/2019   Elevated LDL cholesterol level 11/24/2019   History of colonic polyps    Cecal polyp    Motion sickness 09/16/2017  Primary insomnia 04/29/2017   Eczema 04/29/2017   Colon polyp 10/30/2016   Statin intolerance 04/26/2015   Hyperlipidemia 09/24/2014   Aortic stenosis 09/24/2014   Essential hypertension 03/09/2013   CA of prostate (HCC) 02/13/2013   Abnormal prostate specific antigen 09/13/2011    PCP: Tonna Frederic MD   REFERRING PROVIDER: Dayne Even, MD  REFERRING DIAG: LBP   Rationale for Evaluation and Treatment: Rehabilitation  THERAPY DIAG:  Unsteadiness on feet  Other symptoms and signs involving the musculoskeletal system  Muscle weakness (generalized)  Other low back pain  ONSET DATE: chronic- years   SUBJECTIVE:                                                                                                                                                                                            SUBJECTIVE STATEMENT:  In a little pain when I get up but it is okay.     EVAL: My back will start to hurt after standing for about 5 minutes or more. I was walking the other day and noticed that I also had a pain in the back of my thigh. Felt that same pain in the back of my thigh again yesterday. Have some pain up at the top of my back too. Leg/upper back/lower back pain all feel like separate issues, no shooting pains or sciatica that I know of. PA put me on Meloxicam recently. I've had this for years but it comes on faster now. I've been pretty sedentary but trying to be more active, started walking.   PERTINENT HISTORY:  See above   PAIN:  Are you having pain? No 0/10  today   PRECAUTIONS: None  RED FLAGS: None   WEIGHT BEARING RESTRICTIONS: No  FALLS:  Has patient fallen in last 6 months? Yes. Number of falls 1- not sure exactly what happened, have had other falls in the past due to issues with HTN meds   LIVING ENVIRONMENT: Lives with: lives with their spouse Lives in: House/apartment   OCCUPATION: retired  PLOF: Independent, Independent with basic ADLs, Independent with gait, and Independent with transfers  PATIENT GOALS: get rid of pain, learn how to manage it   NEXT MD VISIT: Referring in 6 weeks   OBJECTIVE:  Note: Objective measures were completed at Evaluation unless otherwise noted.   PATIENT SURVEYS:   Patient-Specific Activity Scoring Scheme  "0" represents "unable to perform." "10" represents "able to perform at prior level. 0 1 2 3 4 5 6 7 8 9  10 (Date and Score)   Activity Eval  07/02/23  07/10/23  1. Standing for >10 minutes  3 6  7   2. Walking longer distances   8 9  9   3.  Balance skills  7 8 8   4.     5.     Score 6 7.6 8   Total score = sum of the activity scores/number of  activities Minimum detectable change (90%CI) for average score = 2 points Minimum detectable change (90%CI) for single activity score = 3 points     COGNITION: Overall cognitive status: Within functional limits for tasks assessed      MUSCLE LENGTH:  HS severe limitation B, piriformis mild limitation B, hip flexors mod limitation B     POSTURE: rounded shoulders and forward head  PALPATION:  Low back very tight, multiple mm spasms noted   LUMBAR ROM:   AROM eval 07/02/23  Flexion Severe limitation  Mod limitation   Extension Moderate limitation Min limitation   Right lateral flexion WNL    Left lateral flexion WNL   Right rotation WNL    Left rotation WNL     (Blank rows = not tested)   LOWER EXTREMITY MMT:    MMT Right eval Left eval  Hip flexion 4 4  Hip extension    Hip abduction 4+ 4+  Hip adduction    Hip internal rotation    Hip external rotation    Knee flexion 4- 4-  Knee extension 4 4  Ankle dorsiflexion    Ankle plantarflexion    Ankle inversion    Ankle eversion     (Blank rows = not tested)  LUMBAR SPECIAL TESTS:  Straight leg raise test: Negative  FUNCTIONAL TESTS:  Dynamic Gait Index: 21/24     07/02/23 0001  Dynamic Gait Index  Level Surface 3  Change in Gait Speed 3  Gait with Horizontal Head Turns 2  Gait with Vertical Head Turns 2  Gait and Pivot Turn 2  Step Over Obstacle 3  Step Around Obstacles 2  Steps 2  Total Score 19    TREATMENT DATE:  07/10/23 NuStep Recheck goals DGI-22/24 Supine LE stretches- HS, ITB, SKTC Feet on pball rotations and knees to chest x10 Bridges 2x10 STS with OHP 2x10 Leg press 20# 2x10    07/08/23 NuStep L5x35mins  Shoulder ext 10# 2x10 AR press 10# 2x10 Resisted gait 30# 4 way x4  Calf stretch 30s x2 Seated row 25# 2x10 Lat pull down 25# 2x10  Side steps on airex Marching on airex  Step up from airex to stair   07/04/23  Nustep L5x8 minutes seat 11 all four extremities    Bridges + ABD into green TB x12 Sidelying clams green TB x12 B  PPT 12x3 seconds  Tandem stance blue foam pad 3x30 seconds B  Narrow BOS blue foam pad EC 3x30 seconds  Forward/backward step overs blue foam pad longwise x10 B intermittent UE touches    07/02/23   Lumbar ROM, PSFS, biomechanics check, DGI, STG update and added balance LTG, education on POC moving forward   One foot on BOSU/one on solid surface 3x30 seconds B Tandem stance blue foam pad 3x30 seconds B Tandem walks blue foam pad x3 laps Sidesteps blue foam pad x4 laps  Nustep L5x8 minutes BLEs only    06/26/23 NuStep L5x64mins  Calf stretch 30s x2 Lateral step ups 6"  Leg ext 10# 2x10 HS curls 25# 2x10 On airex cone taps  Tandem hold on airex  STS on airex x10, then with OHP x10 Catch on airex pad  PATIENT EDUCATION:  Education details: exam findings, POC, HEP  Person educated: Patient Education method: Explanation, Demonstration, and Handouts Education comprehension: verbalized understanding, returned demonstration, and needs further education  HOME EXERCISE PROGRAM:  Access Code: [email or web address] URL: [email or web address] Date: 07/04/2023 Prepared by: Terrel Ferries  Exercises - Supine Single Knee to Chest Stretch  - 2 x daily - 7 x weekly - 1 sets - 5 reps - 5 seconds  hold - Supine Lower Trunk Rotation  - 2 x daily - 7 x weekly - 1 sets - 5 reps - 5 seconds  hold - Hooklying Hamstring Stretch with Strap  - 2 x daily - 7 x weekly - 1 sets - 3 reps - 30 seconds  hold - Supine Transversus Abdominis Bracing - Hands on Stomach  - 5 x daily - 7 x weekly - 1 sets - 10 reps - 3 seconds  hold - Supine Bridge with Resistance Band  - 1 x daily - 5 x weekly - 2 sets - 10 reps - 2 seconds  hold - Clamshell with Resistance  - 1 x daily - 5 x weekly - 2 sets - 10 reps - 1 second  hold - Supine  Posterior Pelvic Tilt  - 1 x daily - 7 x weekly - 2 sets - 10 reps - 3 seconds  hold    ASSESSMENT:  CLINICAL IMPRESSION:  Patient doing well, with progress note he has met all of his goals except pain with functional activities. He has 4 visits left and feels good about wrapping up after them. We continued to work on back and core strengthening and stretching.   as well as some coordination and balance. He seems to be doing good overall. Balance is steadily improving, will continue to progress as appropriate and tolerated.  He reports that coming down the stairs has gotten easier now.    EVAL: Patient is a 78 y.o. M who was seen today for physical therapy evaluation and treatment for LBP. Objectives as above, I think that a lot of his symptoms are related to general deconditioning, joint stiffness, and mm tightness related to sedentary lifestyle. He is mildly unsteady but not too bad. Anticipate that he will benefit from skilled PT services moving forward.    OBJECTIVE IMPAIRMENTS: decreased activity tolerance, decreased balance, decreased mobility, difficulty walking, decreased ROM, decreased strength, increased fascial restrictions, increased muscle spasms, impaired flexibility, postural dysfunction, and pain.   ACTIVITY LIMITATIONS: carrying, lifting, standing, squatting, transfers, bed mobility, and locomotion level  PARTICIPATION LIMITATIONS: meal prep, cleaning, shopping, community activity, and yard work  PERSONAL FACTORS: Age, Education, Fitness, Past/current experiences, and Time since onset of injury/illness/exacerbation are also affecting patient's functional outcome.   REHAB POTENTIAL: Good  CLINICAL DECISION MAKING: Stable/uncomplicated  EVALUATION COMPLEXITY: Low   GOALS: Goals reviewed with patient? No  SHORT TERM GOALS: Target date: 06/24/2023    Will be compliant with appropriate progressive HEP GOAL STATUS: PARTIALLY MET 07/02/23 4/7 days and progressing, MET  07/10/23   2. Will demonstrate improved postural awareness with all functional tasks, use of ergonomic aides PRN/as desired GOAL STATUS: ONGOING 07/02/23, MET 07/10/23   3. Will demonstrate good functional biomechanics for bed mobility and floor to waist lifting mechanics  GOAL STATUS: ONGOING 07/02/23, MET 07/10/23  4. Lumbar flexion and extension AROM to be no more than 25% limited  GOAL STATUS: MET 07/02/23   LONG TERM GOALS: Target date: 07/25/2023    MMT to have improved by one grade all weak  groups GOAL STATUS: hip flexion, knee flexion and ext 5/5 MET 07/10/23  2. Mm flexibility and spasms to have improved by at least 50% in order to improve functional movement patterns and for pain control GOAL STATUS: MET "50% better" 07/10/23   3. Pain to be no more than 1/10 at worst with all functional activities GOAL STATUS: ONGOING 5/10 at worst 07/10/23  4. Will have improved score on PSFS by at least 2 points to show improved QOL and subjective perception of condition  GOAL STATUS: ONGOING 07/02/23, MET 07/10/23  5. Will score at least 22/24 on DGI  Baseline: 19/24 GOAL STATUS: New as of 07/02/23 22/24 MET 07/10/23   PLAN:  PT FREQUENCY: 2x/week  PT DURATION: 6 weeks  PLANNED INTERVENTIONS: 97750- Physical Performance Testing, 97110-Therapeutic exercises, 97530- Therapeutic activity, 97112- Neuromuscular re-education, 97535- Self Care, 56213- Manual therapy, [id number]- Aquatic Therapy, 97035- Ultrasound, [id number]- Ionotophoresis 4mg /ml Dexamethasone , Taping, and Dry Needling.  PLAN FOR NEXT SESSION: hip and LE stretching/mobility, core strength, manual/DN as desired, balance work  Step ups from airex to 6" forwards and laterally  Donavon Fudge, PT, DPT 07/10/23 11:40 AM

## 2023-07-10 ENCOUNTER — Ambulatory Visit

## 2023-07-10 DIAGNOSIS — M6281 Muscle weakness (generalized): Secondary | ICD-10-CM | POA: Diagnosis not present

## 2023-07-10 DIAGNOSIS — M5459 Other low back pain: Secondary | ICD-10-CM

## 2023-07-10 DIAGNOSIS — R29898 Other symptoms and signs involving the musculoskeletal system: Secondary | ICD-10-CM | POA: Diagnosis not present

## 2023-07-10 DIAGNOSIS — R2681 Unsteadiness on feet: Secondary | ICD-10-CM | POA: Diagnosis not present

## 2023-07-15 ENCOUNTER — Ambulatory Visit

## 2023-07-15 DIAGNOSIS — M5459 Other low back pain: Secondary | ICD-10-CM

## 2023-07-15 DIAGNOSIS — M6281 Muscle weakness (generalized): Secondary | ICD-10-CM | POA: Diagnosis not present

## 2023-07-15 DIAGNOSIS — R29898 Other symptoms and signs involving the musculoskeletal system: Secondary | ICD-10-CM | POA: Diagnosis not present

## 2023-07-15 DIAGNOSIS — R2681 Unsteadiness on feet: Secondary | ICD-10-CM

## 2023-07-15 NOTE — Therapy (Signed)
 OUTPATIENT PHYSICAL THERAPY THORACOLUMBAR TREATMENT     Patient Name: Jeffrey Stein MRN: [id number] DOB:01-26-1946, 78 y.o., male Today's Date: 07/15/2023  END OF SESSION:  PT End of Session - 07/15/23 1052     Visit Number 11    Number of Visits 13    Date for PT Re-Evaluation 07/25/23    Authorization Type [employer] MCR    Authorization Time Period 06/03/23 to 07/29/23    Progress Note Due on Visit 10    PT Start Time 1055    PT Stop Time 1140    PT Time Calculation (min) 45 min    Activity Tolerance Patient tolerated treatment well    Behavior During Therapy [facility] for tasks assessed/performed                      Past Medical History:  Diagnosis Date   Abnormal liver enzymes    Allergic rhinitis    Allergy    seasonal and environmental   CAD (coronary artery disease)    Cataract    Colon polyps    Diverticulosis    ED (erectile dysfunction)    GERD (gastroesophageal reflux disease)    Heart murmur    20 yrs ago   Hemochromatosis    HTN (hypertension)    Hyperlipemia    Meniere's disease    Prostate cancer (HCC)    Retinal detachment    Past Surgical History:  Procedure Laterality Date   APPENDECTOMY     BASAL CELL CARCINOMA EXCISION  2014   behind right knee and back   cataracts      COLONOSCOPY  2020   COLONOSCOPY WITH PROPOFOL  N/A 09/23/2018   Procedure: COLONOSCOPY WITH PROPOFOL ;  Surgeon: Albertina Hugger, MD;  Location: [facility];  Service: Gastroenterology;  Laterality: N/A;   COLONOSCOPY WITH PROPOFOL  N/A 10/26/2019   Procedure: COLONOSCOPY WITH PROPOFOL ;  Surgeon: Albertina Hugger, MD;  Location: [facility];  Service: Gastroenterology;  Laterality: N/A;   HEMOSTASIS CLIP PLACEMENT  09/23/2018   Procedure: HEMOSTASIS CLIP PLACEMENT;  Surgeon: Albertina Hugger, MD;  Location: [facility];  Service: Gastroenterology;;   HOT HEMOSTASIS N/A 09/23/2018   Procedure: HOT HEMOSTASIS (ARGON PLASMA COAGULATION/BICAP);  Surgeon: Albertina Hugger, MD;  Location: [facility];  Service: Gastroenterology;  Laterality: N/A;   HOT HEMOSTASIS N/A 10/26/2019   Procedure: HOT HEMOSTASIS (ARGON PLASMA COAGULATION/BICAP);  Surgeon: Albertina Hugger, MD;  Location: [facility];  Service: Gastroenterology;  Laterality: N/A;   POLYPECTOMY     POLYPECTOMY  09/23/2018   Procedure: POLYPECTOMY;  Surgeon: Albertina Hugger, MD;  Location: [facility];  Service: Gastroenterology;;   POLYPECTOMY  10/26/2019   Procedure: POLYPECTOMY;  Surgeon: Albertina Hugger, MD;  Location: [facility];  Service: Gastroenterology;;   PROSTATECTOMY     RETINAL DETACHMENT SURGERY     SUBMUCOSAL LIFTING INJECTION  09/23/2018   Procedure: SUBMUCOSAL LIFTING INJECTION;  Surgeon: Albertina Hugger, MD;  Location: [facility];  Service: Gastroenterology;;   tripple bypass N/A 12/07/2015   Patient Active Problem List   Diagnosis Date Noted   B12 deficiency 06/30/2021   Healthcare maintenance 06/30/2021   Hypotension due to drugs 11/24/2019   Elevated LDL cholesterol level 11/24/2019   History of colonic polyps    Cecal polyp    Motion sickness 09/16/2017   Primary insomnia 04/29/2017   Eczema 04/29/2017   Colon polyp 10/30/2016   Statin intolerance 04/26/2015  Hyperlipidemia 09/24/2014   Aortic stenosis 09/24/2014   Essential hypertension 03/09/2013   CA of prostate (HCC) 02/13/2013   Abnormal prostate specific antigen 09/13/2011    PCP: Tonna Frederic MD   REFERRING PROVIDER: Dayne Even, MD  REFERRING DIAG: LBP   Rationale for Evaluation and Treatment: Rehabilitation  THERAPY DIAG:  Unsteadiness on feet  Other symptoms and signs involving the musculoskeletal system  Muscle weakness (generalized)  Other low back pain  ONSET DATE: chronic- years   SUBJECTIVE:                                                                                                                                                                                            SUBJECTIVE STATEMENT:  I am doing good.     EVAL: My back will start to hurt after standing for about 5 minutes or more. I was walking the other day and noticed that I also had a pain in the back of my thigh. Felt that same pain in the back of my thigh again yesterday. Have some pain up at the top of my back too. Leg/upper back/lower back pain all feel like separate issues, no shooting pains or sciatica that I know of. PA put me on Meloxicam recently. I've had this for years but it comes on faster now. I've been pretty sedentary but trying to be more active, started walking.   PERTINENT HISTORY:  See above   PAIN:  Are you having pain? No 0/10  today   PRECAUTIONS: None  RED FLAGS: None   WEIGHT BEARING RESTRICTIONS: No  FALLS:  Has patient fallen in last 6 months? Yes. Number of falls 1- not sure exactly what happened, have had other falls in the past due to issues with HTN meds   LIVING ENVIRONMENT: Lives with: lives with their spouse Lives in: House/apartment   OCCUPATION: retired  PLOF: Independent, Independent with basic ADLs, Independent with gait, and Independent with transfers  PATIENT GOALS: get rid of pain, learn how to manage it   NEXT MD VISIT: Referring in 6 weeks   OBJECTIVE:  Note: Objective measures were completed at Evaluation unless otherwise noted.   PATIENT SURVEYS:   Patient-Specific Activity Scoring Scheme  "0" represents "unable to perform." "10" represents "able to perform at prior level. 0 1 2 3 4 5 6 7 8 9  10 (Date and Score)   Activity Eval  07/02/23  07/10/23  1. Standing for >10 minutes  3 6  7   2. Walking longer distances   8 9  9   3.  Balance skills  7 8 8   4.  5.     Score 6 7.6 8   Total score = sum of the activity scores/number of activities Minimum detectable change (90%CI) for average score = 2 points Minimum detectable change (90%CI) for single activity score = 3 points     COGNITION: Overall  cognitive status: Within functional limits for tasks assessed      MUSCLE LENGTH:  HS severe limitation B, piriformis mild limitation B, hip flexors mod limitation B     POSTURE: rounded shoulders and forward head  PALPATION:  Low back very tight, multiple mm spasms noted   LUMBAR ROM:   AROM eval 07/02/23  Flexion Severe limitation  Mod limitation   Extension Moderate limitation Min limitation   Right lateral flexion WNL    Left lateral flexion WNL   Right rotation WNL    Left rotation WNL     (Blank rows = not tested)   LOWER EXTREMITY MMT:    MMT Right eval Left eval  Hip flexion 4 4  Hip extension    Hip abduction 4+ 4+  Hip adduction    Hip internal rotation    Hip external rotation    Knee flexion 4- 4-  Knee extension 4 4  Ankle dorsiflexion    Ankle plantarflexion    Ankle inversion    Ankle eversion     (Blank rows = not tested)  LUMBAR SPECIAL TESTS:  Straight leg raise test: Negative  FUNCTIONAL TESTS:  Dynamic Gait Index: 21/24     07/02/23 0001  Dynamic Gait Index  Level Surface 3  Change in Gait Speed 3  Gait with Horizontal Head Turns 2  Gait with Vertical Head Turns 2  Gait and Pivot Turn 2  Step Over Obstacle 3  Step Around Obstacles 2  Steps 2  Total Score 19    TREATMENT DATE:  07/15/23 Bike L4 x71mins  Shoulder ext 10# 2x10  Leg ext 10# 2x10 HS curls 35# 2x10  Walking on beam Tandem and SLS hold on beam  Step up on BOSU  Walk outdoors around back building   07/10/23 NuStep Recheck goals DGI-22/24 Supine LE stretches- HS, ITB, SKTC Feet on pball rotations and knees to chest x10 Bridges 2x10 STS with OHP 2x10 Leg press 20# 2x10    07/08/23 NuStep L5x81mins  Shoulder ext 10# 2x10 AR press 10# 2x10 Resisted gait 30# 4 way x4  Calf stretch 30s x2 Seated row 25# 2x10 Lat pull down 25# 2x10  Side steps on airex Marching on airex  Step up from airex to stair   07/04/23  Nustep L5x8 minutes seat 11 all four  extremities   Bridges + ABD into green TB x12 Sidelying clams green TB x12 B  PPT 12x3 seconds  Tandem stance blue foam pad 3x30 seconds B  Narrow BOS blue foam pad EC 3x30 seconds  Forward/backward step overs blue foam pad longwise x10 B intermittent UE touches    07/02/23   Lumbar ROM, PSFS, biomechanics check, DGI, STG update and added balance LTG, education on POC moving forward   One foot on BOSU/one on solid surface 3x30 seconds B Tandem stance blue foam pad 3x30 seconds B Tandem walks blue foam pad x3 laps Sidesteps blue foam pad x4 laps  Nustep L5x8 minutes BLEs only    06/26/23 NuStep L5x67mins  Calf stretch 30s x2 Lateral step ups 6"  Leg ext 10# 2x10 HS curls 25# 2x10 On airex cone taps  Tandem hold on airex  STS on  airex x10, then with OHP x10 Catch on airex pad                                                                                                               PATIENT EDUCATION:  Education details: exam findings, POC, HEP  Person educated: Patient Education method: Explanation, Demonstration, and Handouts Education comprehension: verbalized understanding, returned demonstration, and needs further education  HOME EXERCISE PROGRAM:  Access Code: [email or web address] URL: [email or web address] Date: 07/04/2023 Prepared by: Terrel Ferries  Exercises - Supine Single Knee to Chest Stretch  - 2 x daily - 7 x weekly - 1 sets - 5 reps - 5 seconds  hold - Supine Lower Trunk Rotation  - 2 x daily - 7 x weekly - 1 sets - 5 reps - 5 seconds  hold - Hooklying Hamstring Stretch with Strap  - 2 x daily - 7 x weekly - 1 sets - 3 reps - 30 seconds  hold - Supine Transversus Abdominis Bracing - Hands on Stomach  - 5 x daily - 7 x weekly - 1 sets - 10 reps - 3 seconds  hold - Supine Bridge with Resistance Band  - 1 x daily - 5 x weekly - 2 sets - 10 reps - 2 seconds  hold - Clamshell with Resistance  - 1 x daily - 5 x weekly - 2 sets - 10 reps - 1 second   hold - Supine Posterior Pelvic Tilt  - 1 x daily - 7 x weekly - 2 sets - 10 reps - 3 seconds  hold    ASSESSMENT:  CLINICAL IMPRESSION: Patient doing well.. He has a few visits left and feels good about wrapping up after them. We continued to work on stability and balance. Does well with all exercises, continue to focus on balance.     EVAL: Patient is a 78 y.o. M who was seen today for physical therapy evaluation and treatment for LBP. Objectives as above, I think that a lot of his symptoms are related to general deconditioning, joint stiffness, and mm tightness related to sedentary lifestyle. He is mildly unsteady but not too bad. Anticipate that he will benefit from skilled PT services moving forward.    OBJECTIVE IMPAIRMENTS: decreased activity tolerance, decreased balance, decreased mobility, difficulty walking, decreased ROM, decreased strength, increased fascial restrictions, increased muscle spasms, impaired flexibility, postural dysfunction, and pain.   ACTIVITY LIMITATIONS: carrying, lifting, standing, squatting, transfers, bed mobility, and locomotion level  PARTICIPATION LIMITATIONS: meal prep, cleaning, shopping, community activity, and yard work  PERSONAL FACTORS: Age, Education, Fitness, Past/current experiences, and Time since onset of injury/illness/exacerbation are also affecting patient's functional outcome.   REHAB POTENTIAL: Good  CLINICAL DECISION MAKING: Stable/uncomplicated  EVALUATION COMPLEXITY: Low   GOALS: Goals reviewed with patient? No  SHORT TERM GOALS: Target date: 06/24/2023    Will be compliant with appropriate progressive HEP GOAL STATUS: PARTIALLY MET 07/02/23 4/7 days and progressing, MET 07/10/23   2. Will demonstrate improved postural awareness with all functional tasks, use of ergonomic  aides PRN/as desired GOAL STATUS: ONGOING 07/02/23, MET 07/10/23   3. Will demonstrate good functional biomechanics for bed mobility and floor to waist  lifting mechanics  GOAL STATUS: ONGOING 07/02/23, MET 07/10/23  4. Lumbar flexion and extension AROM to be no more than 25% limited  GOAL STATUS: MET 07/02/23   LONG TERM GOALS: Target date: 07/25/2023    MMT to have improved by one grade all weak groups GOAL STATUS: hip flexion, knee flexion and ext 5/5 MET 07/10/23  2. Mm flexibility and spasms to have improved by at least 50% in order to improve functional movement patterns and for pain control GOAL STATUS: MET "50% better" 07/10/23   3. Pain to be no more than 1/10 at worst with all functional activities GOAL STATUS: ONGOING 5/10 at worst 07/10/23  4. Will have improved score on PSFS by at least 2 points to show improved QOL and subjective perception of condition  GOAL STATUS: ONGOING 07/02/23, MET 07/10/23  5. Will score at least 22/24 on DGI  Baseline: 19/24 GOAL STATUS: New as of 07/02/23 22/24 MET 07/10/23   PLAN:  PT FREQUENCY: 2x/week  PT DURATION: 6 weeks  PLANNED INTERVENTIONS: 97750- Physical Performance Testing, 97110-Therapeutic exercises, 97530- Therapeutic activity, 97112- Neuromuscular re-education, 97535- Self Care, [id number]- Manual therapy, [id number]- Aquatic Therapy, 97035- Ultrasound, [id number]- Ionotophoresis 4mg /ml Dexamethasone , Taping, and Dry Needling.  PLAN FOR NEXT SESSION: hip and LE stretching/mobility, core strength, manual/DN as desired, balance work  Step ups from airex to 6" forwards and laterally  Donavon Fudge, PT, DPT 07/15/23 11:38 AM

## 2023-07-17 DIAGNOSIS — M545 Low back pain, unspecified: Secondary | ICD-10-CM | POA: Diagnosis not present

## 2023-07-17 NOTE — Therapy (Signed)
 OUTPATIENT PHYSICAL THERAPY THORACOLUMBAR TREATMENT     Patient Name: Jeffrey Stein MRN: [id number] DOB:18-Aug-1945, 78 y.o., male Today's Date: 07/18/2023  END OF SESSION:  PT End of Session - 07/18/23 1101     Visit Number 12    Number of Visits 13    Date for PT Re-Evaluation 07/25/23    Authorization Type [employer] MCR    Authorization Time Period 06/03/23 to 07/29/23    Progress Note Due on Visit 10    PT Start Time 1100    PT Stop Time 1145    PT Time Calculation (min) 45 min    Activity Tolerance Patient tolerated treatment well    Behavior During Therapy WFL for tasks assessed/performed                    Past Medical History:  Diagnosis Date   Abnormal liver enzymes    Allergic rhinitis    Allergy    seasonal and environmental   CAD (coronary artery disease)    Cataract    Colon polyps    Diverticulosis    ED (erectile dysfunction)    GERD (gastroesophageal reflux disease)    Heart murmur    20 yrs ago   Hemochromatosis    HTN (hypertension)    Hyperlipemia    Meniere's disease    Prostate cancer (HCC)    Retinal detachment    Past Surgical History:  Procedure Laterality Date   APPENDECTOMY     BASAL CELL CARCINOMA EXCISION  2014   behind right knee and back   cataracts      COLONOSCOPY  2020   COLONOSCOPY WITH PROPOFOL  N/A 09/23/2018   Procedure: COLONOSCOPY WITH PROPOFOL ;  Surgeon: Albertina Hugger, MD;  Location: [facility];  Service: Gastroenterology;  Laterality: N/A;   COLONOSCOPY WITH PROPOFOL  N/A 10/26/2019   Procedure: COLONOSCOPY WITH PROPOFOL ;  Surgeon: Albertina Hugger, MD;  Location: [facility];  Service: Gastroenterology;  Laterality: N/A;   HEMOSTASIS CLIP PLACEMENT  09/23/2018   Procedure: HEMOSTASIS CLIP PLACEMENT;  Surgeon: Albertina Hugger, MD;  Location: [facility];  Service: Gastroenterology;;   HOT HEMOSTASIS N/A 09/23/2018   Procedure: HOT HEMOSTASIS (ARGON PLASMA COAGULATION/BICAP);  Surgeon: Albertina Hugger, MD;  Location: [facility];  Service: Gastroenterology;  Laterality: N/A;   HOT HEMOSTASIS N/A 10/26/2019   Procedure: HOT HEMOSTASIS (ARGON PLASMA COAGULATION/BICAP);  Surgeon: Albertina Hugger, MD;  Location: [facility];  Service: Gastroenterology;  Laterality: N/A;   POLYPECTOMY     POLYPECTOMY  09/23/2018   Procedure: POLYPECTOMY;  Surgeon: Albertina Hugger, MD;  Location: [facility];  Service: Gastroenterology;;   POLYPECTOMY  10/26/2019   Procedure: POLYPECTOMY;  Surgeon: Albertina Hugger, MD;  Location: [facility];  Service: Gastroenterology;;   PROSTATECTOMY     RETINAL DETACHMENT SURGERY     SUBMUCOSAL LIFTING INJECTION  09/23/2018   Procedure: SUBMUCOSAL LIFTING INJECTION;  Surgeon: Albertina Hugger, MD;  Location: [facility];  Service: Gastroenterology;;   tripple bypass N/A 12/07/2015   Patient Active Problem List   Diagnosis Date Noted   B12 deficiency 06/30/2021   Healthcare maintenance 06/30/2021   Hypotension due to drugs 11/24/2019   Elevated LDL cholesterol level 11/24/2019   History of colonic polyps    Cecal polyp    Motion sickness 09/16/2017   Primary insomnia 04/29/2017   Eczema 04/29/2017   Colon polyp 10/30/2016   Statin intolerance 04/26/2015   Hyperlipidemia  09/24/2014   Aortic stenosis 09/24/2014   Essential hypertension 03/09/2013   CA of prostate (HCC) 02/13/2013   Abnormal prostate specific antigen 09/13/2011    PCP: Tonna Frederic MD   REFERRING PROVIDER: Dayne Even, MD  REFERRING DIAG: LBP   Rationale for Evaluation and Treatment: Rehabilitation  THERAPY DIAG:  Unsteadiness on feet  Other symptoms and signs involving the musculoskeletal system  Muscle weakness (generalized)  Other low back pain  ONSET DATE: chronic- years   SUBJECTIVE:                                                                                                                                                                                            SUBJECTIVE STATEMENT:  Doing pretty good. I have noticed a little soreness in my legs but the back is better.     EVAL: My back will start to hurt after standing for about 5 minutes or more. I was walking the other day and noticed that I also had a pain in the back of my thigh. Felt that same pain in the back of my thigh again yesterday. Have some pain up at the top of my back too. Leg/upper back/lower back pain all feel like separate issues, no shooting pains or sciatica that I know of. PA put me on Meloxicam recently. I've had this for years but it comes on faster now. I've been pretty sedentary but trying to be more active, started walking.   PERTINENT HISTORY:  See above   PAIN:  Are you having pain? No 0/10  today   PRECAUTIONS: None  RED FLAGS: None   WEIGHT BEARING RESTRICTIONS: No  FALLS:  Has patient fallen in last 6 months? Yes. Number of falls 1- not sure exactly what happened, have had other falls in the past due to issues with HTN meds   LIVING ENVIRONMENT: Lives with: lives with their spouse Lives in: House/apartment   OCCUPATION: retired  PLOF: Independent, Independent with basic ADLs, Independent with gait, and Independent with transfers  PATIENT GOALS: get rid of pain, learn how to manage it   NEXT MD VISIT: Referring in 6 weeks   OBJECTIVE:  Note: Objective measures were completed at Evaluation unless otherwise noted.   PATIENT SURVEYS:   Patient-Specific Activity Scoring Scheme  0 represents "unable to perform." 10 represents "able to perform at prior level. 0 1 2 3 4 5 6 7 8 9  10 (Date and Score)   Activity Eval  07/02/23  07/10/23  1. Standing for >10 minutes  3 6  7   2. Walking longer distances   8 9  9  3.  Balance skills  7 8 8   4.     5.     Score 6 7.6 8   Total score = sum of the activity scores/number of activities Minimum detectable change (90%CI) for average score = 2 points Minimum detectable change (90%CI)  for single activity score = 3 points     COGNITION: Overall cognitive status: Within functional limits for tasks assessed      MUSCLE LENGTH:  HS severe limitation B, piriformis mild limitation B, hip flexors mod limitation B     POSTURE: rounded shoulders and forward head  PALPATION:  Low back very tight, multiple mm spasms noted   LUMBAR ROM:   AROM eval 07/02/23  Flexion Severe limitation  Mod limitation   Extension Moderate limitation Min limitation   Right lateral flexion WNL    Left lateral flexion WNL   Right rotation WNL    Left rotation WNL     (Blank rows = not tested)   LOWER EXTREMITY MMT:    MMT Right eval Left eval  Hip flexion 4 4  Hip extension    Hip abduction 4+ 4+  Hip adduction    Hip internal rotation    Hip external rotation    Knee flexion 4- 4-  Knee extension 4 4  Ankle dorsiflexion    Ankle plantarflexion    Ankle inversion    Ankle eversion     (Blank rows = not tested)  LUMBAR SPECIAL TESTS:  Straight leg raise test: Negative  FUNCTIONAL TESTS:  Dynamic Gait Index: 21/24     07/02/23 0001  Dynamic Gait Index  Level Surface 3  Change in Gait Speed 3  Gait with Horizontal Head Turns 2  Gait with Vertical Head Turns 2  Gait and Pivot Turn 2  Step Over Obstacle 3  Step Around Obstacles 2  Steps 2  Total Score 19    TREATMENT DATE:  07/18/23 Seated row 25# 2x10 Lat pull down 25# 2x10 Step ups from airex to 6 forwards and laterally NuStep L5x46mins   On airex cone taps  Rockerboard both directions  Balance on BOSU, then reaching for numbers    07/15/23 Bike L4 x57mins  Shoulder ext 10# 2x10  Leg ext 10# 2x10 HS curls 35# 2x10  Walking on beam Tandem and SLS hold on beam  Step up on BOSU  Walk outdoors around back building   07/10/23 NuStep Recheck goals DGI-22/24 Supine LE stretches- HS, ITB, SKTC Feet on pball rotations and knees to chest x10 Bridges 2x10 STS with OHP 2x10 Leg press 20# 2x10     07/08/23 NuStep L5x58mins  Shoulder ext 10# 2x10 AR press 10# 2x10 Resisted gait 30# 4 way x4  Calf stretch 30s x2 Seated row 25# 2x10 Lat pull down 25# 2x10  Side steps on airex Marching on airex  Step up from airex to stair   07/04/23  Nustep L5x8 minutes seat 11 all four extremities   Bridges + ABD into green TB x12 Sidelying clams green TB x12 B  PPT 12x3 seconds  Tandem stance blue foam pad 3x30 seconds B  Narrow BOS blue foam pad EC 3x30 seconds  Forward/backward step overs blue foam pad longwise x10 B intermittent UE touches    07/02/23   Lumbar ROM, PSFS, biomechanics check, DGI, STG update and added balance LTG, education on POC moving forward   One foot on BOSU/one on solid surface 3x30 seconds B Tandem stance blue foam pad  3x30 seconds B Tandem walks blue foam pad x3 laps Sidesteps blue foam pad x4 laps  Nustep L5x8 minutes BLEs only    06/26/23 NuStep L5x3mins  Calf stretch 30s x2 Lateral step ups 6  Leg ext 10# 2x10 HS curls 25# 2x10 On airex cone taps  Tandem hold on airex  STS on airex x10, then with OHP x10 Catch on airex pad                                                                                                               PATIENT EDUCATION:  Education details: exam findings, POC, HEP  Person educated: Patient Education method: Programmer, multimedia, Demonstration, and Handouts Education comprehension: verbalized understanding, returned demonstration, and needs further education  HOME EXERCISE PROGRAM:  Access Code: [email or web address] URL: [email or web address] Date: 07/04/2023 Prepared by: Terrel Ferries  Exercises - Supine Single Knee to Chest Stretch  - 2 x daily - 7 x weekly - 1 sets - 5 reps - 5 seconds  hold - Supine Lower Trunk Rotation  - 2 x daily - 7 x weekly - 1 sets - 5 reps - 5 seconds  hold - Hooklying Hamstring Stretch with Strap  - 2 x daily - 7 x weekly - 1 sets - 3 reps - 30 seconds  hold - Supine  Transversus Abdominis Bracing - Hands on Stomach  - 5 x daily - 7 x weekly - 1 sets - 10 reps - 3 seconds  hold - Supine Bridge with Resistance Band  - 1 x daily - 5 x weekly - 2 sets - 10 reps - 2 seconds  hold - Clamshell with Resistance  - 1 x daily - 5 x weekly - 2 sets - 10 reps - 1 second  hold - Supine Posterior Pelvic Tilt  - 1 x daily - 7 x weekly - 2 sets - 10 reps - 3 seconds  hold    ASSESSMENT:  CLINICAL IMPRESSION: Patient doing well. He would like to be put on hold after his visits next week to see how much he can do on his own. Would be beneficial to go over some machines with him that he can do at the [facility]. We continued to work on stability and balance. Does well with all exercises, continue to focus on balance. Today he reports more difficulty with balance as we tried harder things today.     EVAL: Patient is a 78 y.o. M who was seen today for physical therapy evaluation and treatment for LBP. Objectives as above, I think that a lot of his symptoms are related to general deconditioning, joint stiffness, and mm tightness related to sedentary lifestyle. He is mildly unsteady but not too bad. Anticipate that he will benefit from skilled PT services moving forward.    OBJECTIVE IMPAIRMENTS: decreased activity tolerance, decreased balance, decreased mobility, difficulty walking, decreased ROM, decreased strength, increased fascial restrictions, increased muscle spasms, impaired flexibility, postural dysfunction, and pain.   ACTIVITY LIMITATIONS: carrying, lifting, standing, squatting, transfers, bed mobility, and locomotion level  PARTICIPATION LIMITATIONS: meal prep, cleaning, shopping, community activity, and yard work  PERSONAL FACTORS: Age, Education, Fitness, Past/current experiences, and Time since onset of injury/illness/exacerbation are also affecting patient's functional outcome.   REHAB POTENTIAL: Good  CLINICAL DECISION MAKING: Stable/uncomplicated  EVALUATION  COMPLEXITY: Low   GOALS: Goals reviewed with patient? No  SHORT TERM GOALS: Target date: 06/24/2023    Will be compliant with appropriate progressive HEP GOAL STATUS: PARTIALLY MET 07/02/23 4/7 days and progressing, MET 07/10/23   2. Will demonstrate improved postural awareness with all functional tasks, use of ergonomic aides PRN/as desired GOAL STATUS: ONGOING 07/02/23, MET 07/10/23   3. Will demonstrate good functional biomechanics for bed mobility and floor to waist lifting mechanics  GOAL STATUS: ONGOING 07/02/23, MET 07/10/23  4. Lumbar flexion and extension AROM to be no more than 25% limited  GOAL STATUS: MET 07/02/23   LONG TERM GOALS: Target date: 07/25/2023    MMT to have improved by one grade all weak groups GOAL STATUS: hip flexion, knee flexion and ext 5/5 MET 07/10/23  2. Mm flexibility and spasms to have improved by at least 50% in order to improve functional movement patterns and for pain control GOAL STATUS: MET 50% better 07/10/23   3. Pain to be no more than 1/10 at worst with all functional activities GOAL STATUS: ONGOING 5/10 at worst 07/10/23  4. Will have improved score on PSFS by at least 2 points to show improved QOL and subjective perception of condition  GOAL STATUS: ONGOING 07/02/23, MET 07/10/23  5. Will score at least 22/24 on DGI  Baseline: 19/24 GOAL STATUS: New as of 07/02/23 22/24 MET 07/10/23   PLAN:  PT FREQUENCY: 2x/week  PT DURATION: 6 weeks  PLANNED INTERVENTIONS: 97750- Physical Performance Testing, 97110-Therapeutic exercises, 97530- Therapeutic activity, 97112- Neuromuscular re-education, 97535- Self Care, 82956- Manual therapy, [id number]- Aquatic Therapy, 97035- Ultrasound, [id number]- Ionotophoresis 4mg /ml Dexamethasone , Taping, and Dry Needling.  PLAN FOR NEXT SESSION: hip and LE stretching/mobility, core strength, manual/DN as desired, balance work   Donavon Fudge, PT, DPT 07/18/23 11:43 AM

## 2023-07-18 ENCOUNTER — Ambulatory Visit

## 2023-07-18 DIAGNOSIS — R2681 Unsteadiness on feet: Secondary | ICD-10-CM

## 2023-07-18 DIAGNOSIS — M5459 Other low back pain: Secondary | ICD-10-CM | POA: Diagnosis not present

## 2023-07-18 DIAGNOSIS — M6281 Muscle weakness (generalized): Secondary | ICD-10-CM | POA: Diagnosis not present

## 2023-07-18 DIAGNOSIS — R29898 Other symptoms and signs involving the musculoskeletal system: Secondary | ICD-10-CM

## 2023-07-23 ENCOUNTER — Encounter: Payer: Self-pay | Admitting: Physical Therapy

## 2023-07-23 ENCOUNTER — Ambulatory Visit: Admitting: Physical Therapy

## 2023-07-23 DIAGNOSIS — R2681 Unsteadiness on feet: Secondary | ICD-10-CM

## 2023-07-23 DIAGNOSIS — R29898 Other symptoms and signs involving the musculoskeletal system: Secondary | ICD-10-CM

## 2023-07-23 DIAGNOSIS — M6281 Muscle weakness (generalized): Secondary | ICD-10-CM | POA: Diagnosis not present

## 2023-07-23 DIAGNOSIS — M5459 Other low back pain: Secondary | ICD-10-CM | POA: Diagnosis not present

## 2023-07-23 NOTE — Therapy (Signed)
 OUTPATIENT PHYSICAL THERAPY THORACOLUMBAR TREATMENT     Patient Name: Jeffrey Stein MRN: [id number] DOB:Dec 17, 1945, 78 y.o., male Today's Date: 07/23/2023  END OF SESSION:  PT End of Session - 07/23/23 1133     Visit Number 13    Number of Visits 17    Date for PT Re-Evaluation 08/20/23    Authorization Type [employer] MCR    Authorization Time Period 06/03/23 to 07/29/23; exended to 7/15    Progress Note Due on Visit 20    PT Start Time 1102    PT Stop Time 1141    PT Time Calculation (min) 39 min    Activity Tolerance Patient tolerated treatment well    Behavior During Therapy [facility] for tasks assessed/performed                     Past Medical History:  Diagnosis Date   Abnormal liver enzymes    Allergic rhinitis    Allergy    seasonal and environmental   CAD (coronary artery disease)    Cataract    Colon polyps    Diverticulosis    ED (erectile dysfunction)    GERD (gastroesophageal reflux disease)    Heart murmur    20 yrs ago   Hemochromatosis    HTN (hypertension)    Hyperlipemia    Meniere's disease    Prostate cancer (HCC)    Retinal detachment    Past Surgical History:  Procedure Laterality Date   APPENDECTOMY     BASAL CELL CARCINOMA EXCISION  2014   behind right knee and back   cataracts      COLONOSCOPY  2020   COLONOSCOPY WITH PROPOFOL  N/A 09/23/2018   Procedure: COLONOSCOPY WITH PROPOFOL ;  Surgeon: Albertina Hugger, MD;  Location: [facility];  Service: Gastroenterology;  Laterality: N/A;   COLONOSCOPY WITH PROPOFOL  N/A 10/26/2019   Procedure: COLONOSCOPY WITH PROPOFOL ;  Surgeon: Albertina Hugger, MD;  Location: [facility];  Service: Gastroenterology;  Laterality: N/A;   HEMOSTASIS CLIP PLACEMENT  09/23/2018   Procedure: HEMOSTASIS CLIP PLACEMENT;  Surgeon: Albertina Hugger, MD;  Location: [facility];  Service: Gastroenterology;;   HOT HEMOSTASIS N/A 09/23/2018   Procedure: HOT HEMOSTASIS (ARGON PLASMA COAGULATION/BICAP);   Surgeon: Albertina Hugger, MD;  Location: [facility];  Service: Gastroenterology;  Laterality: N/A;   HOT HEMOSTASIS N/A 10/26/2019   Procedure: HOT HEMOSTASIS (ARGON PLASMA COAGULATION/BICAP);  Surgeon: Albertina Hugger, MD;  Location: [facility];  Service: Gastroenterology;  Laterality: N/A;   POLYPECTOMY     POLYPECTOMY  09/23/2018   Procedure: POLYPECTOMY;  Surgeon: Albertina Hugger, MD;  Location: [facility];  Service: Gastroenterology;;   POLYPECTOMY  10/26/2019   Procedure: POLYPECTOMY;  Surgeon: Albertina Hugger, MD;  Location: [facility];  Service: Gastroenterology;;   PROSTATECTOMY     RETINAL DETACHMENT SURGERY     SUBMUCOSAL LIFTING INJECTION  09/23/2018   Procedure: SUBMUCOSAL LIFTING INJECTION;  Surgeon: Albertina Hugger, MD;  Location: [facility];  Service: Gastroenterology;;   tripple bypass N/A 12/07/2015   Patient Active Problem List   Diagnosis Date Noted   B12 deficiency 06/30/2021   Healthcare maintenance 06/30/2021   Hypotension due to drugs 11/24/2019   Elevated LDL cholesterol level 11/24/2019   History of colonic polyps    Cecal polyp    Motion sickness 09/16/2017   Primary insomnia 04/29/2017   Eczema 04/29/2017   Colon polyp 10/30/2016   Statin intolerance  04/26/2015   Hyperlipidemia 09/24/2014   Aortic stenosis 09/24/2014   Essential hypertension 03/09/2013   CA of prostate (HCC) 02/13/2013   Abnormal prostate specific antigen 09/13/2011    PCP: Tonna Frederic MD   REFERRING PROVIDER: Dayne Even, MD  REFERRING DIAG: LBP   Rationale for Evaluation and Treatment: Rehabilitation  THERAPY DIAG:  Unsteadiness on feet  Other symptoms and signs involving the musculoskeletal system  Muscle weakness (generalized)  Other low back pain  ONSET DATE: chronic- years   SUBJECTIVE:                                                                                                                                                                                            SUBJECTIVE STATEMENT:  Saw the doctor who told me that he was going to write me another prescription for PT.     EVAL: My back will start to hurt after standing for about 5 minutes or more. I was walking the other day and noticed that I also had a pain in the back of my thigh. Felt that same pain in the back of my thigh again yesterday. Have some pain up at the top of my back too. Leg/upper back/lower back pain all feel like separate issues, no shooting pains or sciatica that I know of. PA put me on Meloxicam recently. I've had this for years but it comes on faster now. I've been pretty sedentary but trying to be more active, started walking.   PERTINENT HISTORY:  See above   PAIN:  Are you having pain? No 0/10  today   PRECAUTIONS: None  RED FLAGS: None   WEIGHT BEARING RESTRICTIONS: No  FALLS:  Has patient fallen in last 6 months? Yes. Number of falls 1- not sure exactly what happened, have had other falls in the past due to issues with HTN meds   LIVING ENVIRONMENT: Lives with: lives with their spouse Lives in: House/apartment   OCCUPATION: retired  PLOF: Independent, Independent with basic ADLs, Independent with gait, and Independent with transfers  PATIENT GOALS: get rid of pain, learn how to manage it   NEXT MD VISIT: Referring in 6 weeks   OBJECTIVE:  Note: Objective measures were completed at Evaluation unless otherwise noted.   PATIENT SURVEYS:   Patient-Specific Activity Scoring Scheme  0 represents "unable to perform." 10 represents "able to perform at prior level. 0 1 2 3 4 5 6 7 8 9  10 (Date and Score)   Activity Eval  07/02/23  07/10/23  1. Standing for >10 minutes  3 6  7   2. Walking longer distances  8 9  9   3.  Balance skills  7 8 8   4.     5.     Score 6 7.6 8   Total score = sum of the activity scores/number of activities Minimum detectable change (90%CI) for average score = 2 points Minimum  detectable change (90%CI) for single activity score = 3 points     COGNITION: Overall cognitive status: Within functional limits for tasks assessed      MUSCLE LENGTH:  EVAL: HS severe limitation B, piriformis mild limitation B, hip flexors mod limitation B   07/23/23: HS moderate limitation B, piriformis WNL B, hip flexors mild limitation B      POSTURE: rounded shoulders and forward head  PALPATION:  Low back very tight, multiple mm spasms noted   LUMBAR ROM:   AROM eval 07/02/23 07/23/23  Flexion Severe limitation  Mod limitation  Min limitation   Extension Moderate limitation Min limitation  Min limitation   Right lateral flexion WNL     Left lateral flexion WNL    Right rotation WNL     Left rotation WNL      (Blank rows = not tested)   LOWER EXTREMITY MMT:    MMT Right eval Left eval Right 07/23/23 Left 07/23/23  Hip flexion 4 4 5 5   Hip extension      Hip abduction 4+ 4+ 4+ 4+  Hip adduction      Hip internal rotation      Hip external rotation      Knee flexion 4- 4- 5 5  Knee extension 4 4 5 5   Ankle dorsiflexion      Ankle plantarflexion      Ankle inversion      Ankle eversion       (Blank rows = not tested)  LUMBAR SPECIAL TESTS:  Straight leg raise test: Negative  FUNCTIONAL TESTS:  Dynamic Gait Index: 21/24      07/23/23 0001  Dynamic Gait Index  Level Surface 3  Change in Gait Speed 3  Gait with Horizontal Head Turns 2  Gait with Vertical Head Turns 3  Gait and Pivot Turn 3  Step Over Obstacle 3  Step Around Obstacles 3  Steps 3  Total Score 23     TREATMENT DATE:   07/23/23  MMT, DGI, lumbar ROM, goal check  Extensive education that he is doing very well with PT/has met most of his goals and is close to maxing out all measures- therapists use skilled objective measures and tests to help determine progress with skilled PT services, needs to show objective progress on a consistent basis to continue with therapy.  Maintenance therapy is generally not allowed for by insurances excpt for a couple of exceptions like MS or Parkinsons/neurodegenerative diseases(regardless of visit limits). OK to extend briefly for further fine tune of balance but will DC to long term HEP after that.   Forward and backward heel toe on blue foam pad // bars  Wide tandem on blue air pads 3x30 seconds B Alternating lunges onto BOSU x20    07/18/23 Seated row 25# 2x10 Lat pull down 25# 2x10 Step ups from airex to 6 forwards and laterally NuStep L5x61mins   On airex cone taps  Rockerboard both directions  Balance on BOSU, then reaching for numbers    07/15/23 Bike L4 x28mins  Shoulder ext 10# 2x10  Leg ext 10# 2x10 HS curls 35# 2x10  Walking on beam Tandem and SLS hold on beam  Step up on BOSU  Walk outdoors around back building   07/10/23 NuStep Recheck goals DGI-22/24 Supine LE stretches- HS, ITB, SKTC Feet on pball rotations and knees to chest x10 Bridges 2x10 STS with OHP 2x10 Leg press 20# 2x10                                                                                                                  PATIENT EDUCATION:  Education details: exam findings, POC, HEP  Person educated: Patient Education method: Programmer, multimedia, Demonstration, and Handouts Education comprehension: verbalized understanding, returned demonstration, and needs further education  HOME EXERCISE PROGRAM:  Access Code: [email or web address] URL: [email or web address] Date: 07/04/2023 Prepared by: Terrel Ferries  Exercises - Supine Single Knee to Chest Stretch  - 2 x daily - 7 x weekly - 1 sets - 5 reps - 5 seconds  hold - Supine Lower Trunk Rotation  - 2 x daily - 7 x weekly - 1 sets - 5 reps - 5 seconds  hold - Hooklying Hamstring Stretch with Strap  - 2 x daily - 7 x weekly - 1 sets - 3 reps - 30 seconds  hold - Supine Transversus Abdominis Bracing - Hands on Stomach  - 5 x daily - 7 x weekly - 1 sets - 10 reps - 3 seconds   hold - Supine Bridge with Resistance Band  - 1 x daily - 5 x weekly - 2 sets - 10 reps - 2 seconds  hold - Clamshell with Resistance  - 1 x daily - 5 x weekly - 2 sets - 10 reps - 1 second  hold - Supine Posterior Pelvic Tilt  - 1 x daily - 7 x weekly - 2 sets - 10 reps - 3 seconds  hold    ASSESSMENT:  CLINICAL IMPRESSION:   Focused on extensive re-assessment and goal check today- really had to provide a lot of education on role of therapy in optimizing function/then transitioning to long term HEP. He is really making excellent progress with skilled PT services and is very close to maxing out scores on objective tests/benefit from PT. We can extend for a few more visits to focus on balance, however at that point will have to transition to long term HEP for long term maintenance on his own.    EVAL: Patient is a 78 y.o. M who was seen today for physical therapy evaluation and treatment for LBP. Objectives as above, I think that a lot of his symptoms are related to general deconditioning, joint stiffness, and mm tightness related to sedentary lifestyle. He is mildly unsteady but not too bad. Anticipate that he will benefit from skilled PT services moving forward.    OBJECTIVE IMPAIRMENTS: decreased activity tolerance, decreased balance, decreased mobility, difficulty walking, decreased ROM, decreased strength, increased fascial restrictions, increased muscle spasms, impaired flexibility, postural dysfunction, and pain.   ACTIVITY LIMITATIONS: carrying, lifting, standing, squatting, transfers, bed mobility, and locomotion level  PARTICIPATION LIMITATIONS: meal prep, cleaning, shopping, community activity, and yard work  PERSONAL  FACTORS: Age, Education, Fitness, Past/current experiences, and Time since onset of injury/illness/exacerbation are also affecting patient's functional outcome.   REHAB POTENTIAL: Good  CLINICAL DECISION MAKING: Stable/uncomplicated  EVALUATION COMPLEXITY:  Low   GOALS: Goals reviewed with patient? No  SHORT TERM GOALS: Target date: 08/06/2023      Will be compliant with appropriate progressive HEP GOAL STATUS: PARTIALLY MET 07/02/23 4/7 days and progressing, MET 07/10/23   2. Will demonstrate improved postural awareness with all functional tasks, use of ergonomic aides PRN/as desired GOAL STATUS: ONGOING 07/02/23, MET 07/10/23   3. Will demonstrate good functional biomechanics for bed mobility and floor to waist lifting mechanics  GOAL STATUS: ONGOING 07/02/23, MET 07/10/23  4. Lumbar flexion and extension AROM to be no more than 25% limited  GOAL STATUS: MET 07/02/23   LONG TERM GOALS: Target date: 08/20/2023      MMT to have improved by one grade all weak groups GOAL STATUS: hip flexion, knee flexion and ext 5/5 MET 07/10/23  2. Mm flexibility and spasms to have improved by at least 50% in order to improve functional movement patterns and for pain control GOAL STATUS: MET 50% better 07/10/23   3. Pain to be no more than 1/10 at worst with all functional activities GOAL STATUS: ONGOING 07/23/23 3/10 at worst   4. Will have improved score on PSFS by at least 2 points to show improved QOL and subjective perception of condition  GOAL STATUS: ONGOING 07/02/23, MET 07/10/23  5. Will score at least 22/24 on DGI  Baseline: 19/24 GOAL STATUS: New as of 07/02/23 22/24 MET 07/10/23  6. Will be able to navigate challenging outdoor surfaces without difficulty to show ability to confidently and safely navigate golf course and construction sites  GOAL STATUS: NEW 07/23/23   PLAN:  PT FREQUENCY: 1x/week  PT DURATION: 4 weeks  PLANNED INTERVENTIONS: 97750- Physical Performance Testing, 97110-Therapeutic exercises, 97530- Therapeutic activity, [id number]- Neuromuscular re-education, 97535- Self Care, 21308- Manual therapy, [id number]- Aquatic Therapy, 97035- Ultrasound, [id number]- Ionotophoresis 4mg /ml Dexamethasone , Taping, and Dry Needling.  PLAN FOR NEXT  SESSION: 4 additional visits with focus on high level balance then hard DC to advanced HEP   Terrel Ferries, PT, DPT 07/23/23 11:43 AM

## 2023-07-25 ENCOUNTER — Ambulatory Visit: Admitting: Physical Therapy

## 2023-07-30 ENCOUNTER — Encounter: Payer: Self-pay | Admitting: Physical Therapy

## 2023-07-30 ENCOUNTER — Ambulatory Visit: Admitting: Physical Therapy

## 2023-07-30 DIAGNOSIS — M6281 Muscle weakness (generalized): Secondary | ICD-10-CM

## 2023-07-30 DIAGNOSIS — M5459 Other low back pain: Secondary | ICD-10-CM

## 2023-07-30 DIAGNOSIS — R2681 Unsteadiness on feet: Secondary | ICD-10-CM | POA: Diagnosis not present

## 2023-07-30 DIAGNOSIS — R29898 Other symptoms and signs involving the musculoskeletal system: Secondary | ICD-10-CM | POA: Diagnosis not present

## 2023-07-30 NOTE — Therapy (Signed)
 OUTPATIENT PHYSICAL THERAPY THORACOLUMBAR TREATMENT     Patient Name: Jeffrey Stein MRN: [id number] DOB:[birth year], 78 y.o., male Today's Date: 07/30/2023  END OF SESSION:  PT End of Session - 07/30/23 0842     Visit Number 14    Date for PT Re-Evaluation 08/20/23    PT Start Time 0843    PT Stop Time 0927    PT Time Calculation (min) 44 min    Activity Tolerance Patient tolerated treatment well    Behavior During Therapy [facility] for tasks assessed/performed                     Past Medical History:  Diagnosis Date   Abnormal liver enzymes    Allergic rhinitis    Allergy    seasonal and environmental   CAD (coronary artery disease)    Cataract    Colon polyps    Diverticulosis    ED (erectile dysfunction)    GERD (gastroesophageal reflux disease)    Heart murmur    20 yrs ago   Hemochromatosis    HTN (hypertension)    Hyperlipemia    Meniere's disease    Prostate cancer (HCC)    Retinal detachment    Past Surgical History:  Procedure Laterality Date   APPENDECTOMY     BASAL CELL CARCINOMA EXCISION  2014   behind right knee and back   cataracts      COLONOSCOPY  2020   COLONOSCOPY WITH PROPOFOL  N/A 09/23/2018   Procedure: COLONOSCOPY WITH PROPOFOL ;  Surgeon: Legrand Victory LITTIE DOUGLAS, MD;  Location: [facility];  Service: Gastroenterology;  Laterality: N/A;   COLONOSCOPY WITH PROPOFOL  N/A 10/26/2019   Procedure: COLONOSCOPY WITH PROPOFOL ;  Surgeon: Legrand Victory LITTIE DOUGLAS, MD;  Location: [facility];  Service: Gastroenterology;  Laterality: N/A;   HEMOSTASIS CLIP PLACEMENT  09/23/2018   Procedure: HEMOSTASIS CLIP PLACEMENT;  Surgeon: Legrand Victory LITTIE DOUGLAS, MD;  Location: [facility];  Service: Gastroenterology;;   HOT HEMOSTASIS N/A 09/23/2018   Procedure: HOT HEMOSTASIS (ARGON PLASMA COAGULATION/BICAP);  Surgeon: Legrand Victory LITTIE DOUGLAS, MD;  Location: [facility];  Service: Gastroenterology;  Laterality: N/A;   HOT HEMOSTASIS N/A 10/26/2019   Procedure: HOT HEMOSTASIS  (ARGON PLASMA COAGULATION/BICAP);  Surgeon: Legrand Victory LITTIE DOUGLAS, MD;  Location: [facility];  Service: Gastroenterology;  Laterality: N/A;   POLYPECTOMY     POLYPECTOMY  09/23/2018   Procedure: POLYPECTOMY;  Surgeon: Legrand Victory LITTIE DOUGLAS, MD;  Location: [facility];  Service: Gastroenterology;;   POLYPECTOMY  10/26/2019   Procedure: POLYPECTOMY;  Surgeon: Legrand Victory LITTIE DOUGLAS, MD;  Location: [facility];  Service: Gastroenterology;;   PROSTATECTOMY     RETINAL DETACHMENT SURGERY     SUBMUCOSAL LIFTING INJECTION  09/23/2018   Procedure: SUBMUCOSAL LIFTING INJECTION;  Surgeon: Legrand Victory LITTIE DOUGLAS, MD;  Location: [facility];  Service: Gastroenterology;;   tripple bypass N/A 12/07/2015   Patient Active Problem List   Diagnosis Date Noted   B12 deficiency 06/30/2021   Healthcare maintenance 06/30/2021   Hypotension due to drugs 11/24/2019   Elevated LDL cholesterol level 11/24/2019   History of colonic polyps    Cecal polyp    Motion sickness 09/16/2017   Primary insomnia 04/29/2017   Eczema 04/29/2017   Colon polyp 10/30/2016   Statin intolerance 04/26/2015   Hyperlipidemia 09/24/2014   Aortic stenosis 09/24/2014   Essential hypertension 03/09/2013   CA of prostate (HCC) 02/13/2013   Abnormal prostate specific antigen 09/13/2011    PCP: Berneta Fallow  Alfred MD   REFERRING PROVIDER: Sheril Coy, MD  REFERRING DIAG: LBP   Rationale for Evaluation and Treatment: Rehabilitation  THERAPY DIAG:  Unsteadiness on feet  Other symptoms and signs involving the musculoskeletal system  Muscle weakness (generalized)  Other low back pain  ONSET DATE: chronic- years   SUBJECTIVE:                                                                                                                                                                                           SUBJECTIVE STATEMENT:  Good a little ache in the lower back    EVAL: My back will start to hurt after standing  for about 5 minutes or more. I was walking the other day and noticed that I also had a pain in the back of my thigh. Felt that same pain in the back of my thigh again yesterday. Have some pain up at the top of my back too. Leg/upper back/lower back pain all feel like separate issues, no shooting pains or sciatica that I know of. PA put me on Meloxicam recently. I've had this for years but it comes on faster now. I've been pretty sedentary but trying to be more active, started walking.   PERTINENT HISTORY:  See above   PAIN:  Are you having pain? No 0/10  today   PRECAUTIONS: None  RED FLAGS: None   WEIGHT BEARING RESTRICTIONS: No  FALLS:  Has patient fallen in last 6 months? Yes. Number of falls 1- not sure exactly what happened, have had other falls in the past due to issues with HTN meds   LIVING ENVIRONMENT: Lives with: lives with their spouse Lives in: House/apartment   OCCUPATION: retired  PLOF: Independent, Independent with basic ADLs, Independent with gait, and Independent with transfers  PATIENT GOALS: get rid of pain, learn how to manage it   NEXT MD VISIT: Referring in 6 weeks   OBJECTIVE:  Note: Objective measures were completed at Evaluation unless otherwise noted.   PATIENT SURVEYS:   Patient-Specific Activity Scoring Scheme  0 represents "unable to perform." 10 represents "able to perform at prior level. 0 1 2 3 4 5 6 7 8 9  10 (Date and Score)   Activity Eval  07/02/23  07/10/23  1. Standing for >10 minutes  3 6  7   2. Walking longer distances   8 9  9   3.  Balance skills  7 8 8   4.     5.     Score 6 7.6 8   Total score = sum of the activity scores/number of activities Minimum detectable change (90%CI)  for average score = 2 points Minimum detectable change (90%CI) for single activity score = 3 points     COGNITION: Overall cognitive status: Within functional limits for tasks assessed      MUSCLE LENGTH:  EVAL: HS severe limitation B,  piriformis mild limitation B, hip flexors mod limitation B   07/23/23: HS moderate limitation B, piriformis WNL B, hip flexors mild limitation B      POSTURE: rounded shoulders and forward head  PALPATION:  Low back very tight, multiple mm spasms noted   LUMBAR ROM:   AROM eval 07/02/23 07/23/23  Flexion Severe limitation  Mod limitation  Min limitation   Extension Moderate limitation Min limitation  Min limitation   Right lateral flexion WNL     Left lateral flexion WNL    Right rotation WNL     Left rotation WNL      (Blank rows = not tested)   LOWER EXTREMITY MMT:    MMT Right eval Left eval Right 07/23/23 Left 07/23/23  Hip flexion 4 4 5 5   Hip extension      Hip abduction 4+ 4+ 4+ 4+  Hip adduction      Hip internal rotation      Hip external rotation      Knee flexion 4- 4- 5 5  Knee extension 4 4 5 5   Ankle dorsiflexion      Ankle plantarflexion      Ankle inversion      Ankle eversion       (Blank rows = not tested)  LUMBAR SPECIAL TESTS:  Straight leg raise test: Negative  FUNCTIONAL TESTS:  Dynamic Gait Index: 21/24      07/23/23 0001  Dynamic Gait Index  Level Surface 3  Change in Gait Speed 3  Gait with Horizontal Head Turns 2  Gait with Vertical Head Turns 3  Gait and Pivot Turn 3  Step Over Obstacle 3  Step Around Obstacles 3  Steps 3  Total Score 23     TREATMENT DATE:  07/30/23 Bike L 4 x 6 min Alt 6in box taps w/ 30lb posterior pull then lateal pull x10 each  Step ups airex on 6in box x10 each HS curls 45lb 2x10 Leg ext 15# 2x10 Shoulder Ext 10lb standing on airex 2x10 Heel raises 2x10 S2S OHP yellow ball on airex 2x10 Side step on and off airex   07/23/23  MMT, DGI, lumbar ROM, goal check  Extensive education that he is doing very well with PT/has met most of his goals and is close to maxing out all measures- therapists use skilled objective measures and tests to help determine progress with skilled PT services, needs to  show objective progress on a consistent basis to continue with therapy. Maintenance therapy is generally not allowed for by insurances excpt for a couple of exceptions like MS or Parkinsons/neurodegenerative diseases(regardless of visit limits). OK to extend briefly for further fine tune of balance but will DC to long term HEP after that.   Forward and backward heel toe on blue foam pad // bars  Wide tandem on blue air pads 3x30 seconds B Alternating lunges onto BOSU x20    07/18/23 Seated row 25# 2x10 Lat pull down 25# 2x10 Step ups from airex to 6 forwards and laterally NuStep L5x62mins   On airex cone taps  Rockerboard both directions  Balance on BOSU, then reaching for numbers    07/15/23 Bike L4 x41mins  Shoulder ext 10# 2x10  Leg  ext 10# 2x10 HS curls 35# 2x10  Walking on beam Tandem and SLS hold on beam  Step up on BOSU  Walk outdoors around back building   07/10/23 NuStep Recheck goals DGI-22/24 Supine LE stretches- HS, ITB, SKTC Feet on pball rotations and knees to chest x10 Bridges 2x10 STS with OHP 2x10 Leg press 20# 2x10                                                                                                                  PATIENT EDUCATION:  Education details: exam findings, POC, HEP  Person educated: Patient Education method: Programmer, multimedia, Demonstration, and Handouts Education comprehension: verbalized understanding, returned demonstration, and needs further education  HOME EXERCISE PROGRAM:  Access Code: [email or web address] URL: [email or web address] Date: 07/04/2023 Prepared by: Josette Rough  Exercises - Supine Single Knee to Chest Stretch  - 2 x daily - 7 x weekly - 1 sets - 5 reps - 5 seconds  hold - Supine Lower Trunk Rotation  - 2 x daily - 7 x weekly - 1 sets - 5 reps - 5 seconds  hold - Hooklying Hamstring Stretch with Strap  - 2 x daily - 7 x weekly - 1 sets - 3 reps - 30 seconds  hold - Supine Transversus Abdominis Bracing -  Hands on Stomach  - 5 x daily - 7 x weekly - 1 sets - 10 reps - 3 seconds  hold - Supine Bridge with Resistance Band  - 1 x daily - 5 x weekly - 2 sets - 10 reps - 2 seconds  hold - Clamshell with Resistance  - 1 x daily - 5 x weekly - 2 sets - 10 reps - 1 second  hold - Supine Posterior Pelvic Tilt  - 1 x daily - 7 x weekly - 2 sets - 10 reps - 3 seconds  hold    ASSESSMENT:  CLINICAL IMPRESSION:   Pt enters doing well reporting no pain but an ache in his low back. Session consisted of postural strength and balance. Some difficulty with lateral pull alt box taps. Cue not to allow LE to push against table with sit to stands. Increase resistance tolerated with leg curls and extensions. He is really making excellent progress with skilled PT services and is very close to maxing out scores on objective tests/benefit from PT. We can extend for a few more visits to focus on balance, however at that point will have to transition to long term HEP for long term maintenance on his own.    EVAL: Patient is a 78 y.o. M who was seen today for physical therapy evaluation and treatment for LBP. Objectives as above, I think that a lot of his symptoms are related to general deconditioning, joint stiffness, and mm tightness related to sedentary lifestyle. He is mildly unsteady but not too bad. Anticipate that he will benefit from skilled PT services moving forward.    OBJECTIVE IMPAIRMENTS: decreased activity tolerance, decreased balance, decreased mobility, difficulty walking, decreased ROM, decreased strength,  increased fascial restrictions, increased muscle spasms, impaired flexibility, postural dysfunction, and pain.   ACTIVITY LIMITATIONS: carrying, lifting, standing, squatting, transfers, bed mobility, and locomotion level  PARTICIPATION LIMITATIONS: meal prep, cleaning, shopping, community activity, and yard work  PERSONAL FACTORS: Age, Education, Fitness, Past/current experiences, and Time since onset  of injury/illness/exacerbation are also affecting patient's functional outcome.   REHAB POTENTIAL: Good  CLINICAL DECISION MAKING: Stable/uncomplicated  EVALUATION COMPLEXITY: Low   GOALS: Goals reviewed with patient? No  SHORT TERM GOALS: Target date: 08/06/2023      Will be compliant with appropriate progressive HEP GOAL STATUS: PARTIALLY MET 07/02/23 4/7 days and progressing, MET 07/10/23   2. Will demonstrate improved postural awareness with all functional tasks, use of ergonomic aides PRN/as desired GOAL STATUS: ONGOING 07/02/23, MET 07/10/23   3. Will demonstrate good functional biomechanics for bed mobility and floor to waist lifting mechanics  GOAL STATUS: ONGOING 07/02/23, MET 07/10/23  4. Lumbar flexion and extension AROM to be no more than 25% limited  GOAL STATUS: MET 07/02/23   LONG TERM GOALS: Target date: 08/20/2023      MMT to have improved by one grade all weak groups GOAL STATUS: hip flexion, knee flexion and ext 5/5 MET 07/10/23  2. Mm flexibility and spasms to have improved by at least 50% in order to improve functional movement patterns and for pain control GOAL STATUS: MET 50% better 07/10/23   3. Pain to be no more than 1/10 at worst with all functional activities GOAL STATUS: ONGOING 07/23/23 3/10 at worst   4. Will have improved score on PSFS by at least 2 points to show improved QOL and subjective perception of condition  GOAL STATUS: ONGOING 07/02/23, MET 07/10/23  5. Will score at least 22/24 on DGI  Baseline: 19/24 GOAL STATUS: New as of 07/02/23 22/24 MET 07/10/23  6. Will be able to navigate challenging outdoor surfaces without difficulty to show ability to confidently and safely navigate golf course and construction sites  GOAL STATUS: NEW 07/23/23   PLAN:  PT FREQUENCY: 1x/week  PT DURATION: 4 weeks  PLANNED INTERVENTIONS: 97750- Physical Performance Testing, 97110-Therapeutic exercises, 97530- Therapeutic activity, [id number]- Neuromuscular  re-education, 97535- Self Care, [id number]- Manual therapy, [id number]- Aquatic Therapy, 97035- Ultrasound, [id number]- Ionotophoresis 4mg /ml Dexamethasone , Taping, and Dry Needling.  PLAN FOR NEXT SESSION: 3 additional visits with focus on high level balance then hard DC to advanced HEP   Tanda Sorrow, PTA 07/30/23 8:42 AM

## 2023-08-06 ENCOUNTER — Ambulatory Visit: Attending: Family Medicine | Admitting: Physical Therapy

## 2023-08-06 ENCOUNTER — Encounter: Payer: Self-pay | Admitting: Physical Therapy

## 2023-08-06 DIAGNOSIS — R2681 Unsteadiness on feet: Secondary | ICD-10-CM | POA: Insufficient documentation

## 2023-08-06 DIAGNOSIS — R29898 Other symptoms and signs involving the musculoskeletal system: Secondary | ICD-10-CM | POA: Insufficient documentation

## 2023-08-06 DIAGNOSIS — M6281 Muscle weakness (generalized): Secondary | ICD-10-CM | POA: Insufficient documentation

## 2023-08-06 DIAGNOSIS — M5459 Other low back pain: Secondary | ICD-10-CM | POA: Diagnosis present

## 2023-08-06 NOTE — Therapy (Signed)
 OUTPATIENT PHYSICAL THERAPY THORACOLUMBAR TREATMENT     Patient Name: Jeffrey Stein MRN: [id number] DOB:December 22, 1945, 78 y.o., male Today's Date: 08/06/2023  END OF SESSION:  PT End of Session - 08/06/23 1010     Visit Number 15    Date for PT Re-Evaluation 08/20/23    PT Start Time 1015    PT Stop Time 1100    PT Time Calculation (min) 45 min    Activity Tolerance Patient tolerated treatment well    Behavior During Therapy WFL for tasks assessed/performed                     Past Medical History:  Diagnosis Date   Abnormal liver enzymes    Allergic rhinitis    Allergy    seasonal and environmental   CAD (coronary artery disease)    Cataract    Colon polyps    Diverticulosis    ED (erectile dysfunction)    GERD (gastroesophageal reflux disease)    Heart murmur    20 yrs ago   Hemochromatosis    HTN (hypertension)    Hyperlipemia    Meniere's disease    Prostate cancer (HCC)    Retinal detachment    Past Surgical History:  Procedure Laterality Date   APPENDECTOMY     BASAL CELL CARCINOMA EXCISION  2014   behind right knee and back   cataracts      COLONOSCOPY  2020   COLONOSCOPY WITH PROPOFOL  N/A 09/23/2018   Procedure: COLONOSCOPY WITH PROPOFOL ;  Surgeon: Legrand Victory LITTIE DOUGLAS, MD;  Location: [facility];  Service: Gastroenterology;  Laterality: N/A;   COLONOSCOPY WITH PROPOFOL  N/A 10/26/2019   Procedure: COLONOSCOPY WITH PROPOFOL ;  Surgeon: Legrand Victory LITTIE DOUGLAS, MD;  Location: [facility];  Service: Gastroenterology;  Laterality: N/A;   HEMOSTASIS CLIP PLACEMENT  09/23/2018   Procedure: HEMOSTASIS CLIP PLACEMENT;  Surgeon: Legrand Victory LITTIE DOUGLAS, MD;  Location: [facility];  Service: Gastroenterology;;   HOT HEMOSTASIS N/A 09/23/2018   Procedure: HOT HEMOSTASIS (ARGON PLASMA COAGULATION/BICAP);  Surgeon: Legrand Victory LITTIE DOUGLAS, MD;  Location: [facility];  Service: Gastroenterology;  Laterality: N/A;   HOT HEMOSTASIS N/A 10/26/2019   Procedure: HOT HEMOSTASIS  (ARGON PLASMA COAGULATION/BICAP);  Surgeon: Legrand Victory LITTIE DOUGLAS, MD;  Location: [facility];  Service: Gastroenterology;  Laterality: N/A;   POLYPECTOMY     POLYPECTOMY  09/23/2018   Procedure: POLYPECTOMY;  Surgeon: Legrand Victory LITTIE DOUGLAS, MD;  Location: [facility];  Service: Gastroenterology;;   POLYPECTOMY  10/26/2019   Procedure: POLYPECTOMY;  Surgeon: Legrand Victory LITTIE DOUGLAS, MD;  Location: [facility];  Service: Gastroenterology;;   PROSTATECTOMY     RETINAL DETACHMENT SURGERY     SUBMUCOSAL LIFTING INJECTION  09/23/2018   Procedure: SUBMUCOSAL LIFTING INJECTION;  Surgeon: Legrand Victory LITTIE DOUGLAS, MD;  Location: [facility];  Service: Gastroenterology;;   tripple bypass N/A 12/07/2015   Patient Active Problem List   Diagnosis Date Noted   B12 deficiency 06/30/2021   Healthcare maintenance 06/30/2021   Hypotension due to drugs 11/24/2019   Elevated LDL cholesterol level 11/24/2019   History of colonic polyps    Cecal polyp    Motion sickness 09/16/2017   Primary insomnia 04/29/2017   Eczema 04/29/2017   Colon polyp 10/30/2016   Statin intolerance 04/26/2015   Hyperlipidemia 09/24/2014   Aortic stenosis 09/24/2014   Essential hypertension 03/09/2013   CA of prostate (HCC) 02/13/2013   Abnormal prostate specific antigen 09/13/2011    PCP: Berneta Fallow  Sim MD   REFERRING PROVIDER: Sheril Coy, MD  REFERRING DIAG: LBP   Rationale for Evaluation and Treatment: Rehabilitation  THERAPY DIAG:  Unsteadiness on feet  Other symptoms and signs involving the musculoskeletal system  Muscle weakness (generalized)  Other low back pain  ONSET DATE: chronic- years   SUBJECTIVE:                                                                                                                                                                                           SUBJECTIVE STATEMENT:  Doing good    EVAL: My back will start to hurt after standing for about 5 minutes or more. I  was walking the other day and noticed that I also had a pain in the back of my thigh. Felt that same pain in the back of my thigh again yesterday. Have some pain up at the top of my back too. Leg/upper back/lower back pain all feel like separate issues, no shooting pains or sciatica that I know of. PA put me on Meloxicam recently. I've had this for years but it comes on faster now. I've been pretty sedentary but trying to be more active, started walking.   PERTINENT HISTORY:  See above   PAIN:  Are you having pain? No 0/10  today   PRECAUTIONS: None  RED FLAGS: None   WEIGHT BEARING RESTRICTIONS: No  FALLS:  Has patient fallen in last 6 months? Yes. Number of falls 1- not sure exactly what happened, have had other falls in the past due to issues with HTN meds   LIVING ENVIRONMENT: Lives with: lives with their spouse Lives in: House/apartment   OCCUPATION: retired  PLOF: Independent, Independent with basic ADLs, Independent with gait, and Independent with transfers  PATIENT GOALS: get rid of pain, learn how to manage it   NEXT MD VISIT: Referring in 6 weeks   OBJECTIVE:  Note: Objective measures were completed at Evaluation unless otherwise noted.   PATIENT SURVEYS:   Patient-Specific Activity Scoring Scheme  0 represents "unable to perform." 10 represents "able to perform at prior level. 0 1 2 3 4 5 6 7 8 9  10 (Date and Score)   Activity Eval  07/02/23  07/10/23  1. Standing for >10 minutes  3 6  7   2. Walking longer distances   8 9  9   3.  Balance skills  7 8 8   4.     5.     Score 6 7.6 8   Total score = sum of the activity scores/number of activities Minimum detectable change (90%CI) for average score = 2 points  Minimum detectable change (90%CI) for single activity score = 3 points     COGNITION: Overall cognitive status: Within functional limits for tasks assessed      MUSCLE LENGTH:  EVAL: HS severe limitation B, piriformis mild limitation B, hip  flexors mod limitation B   07/23/23: HS moderate limitation B, piriformis WNL B, hip flexors mild limitation B      POSTURE: rounded shoulders and forward head  PALPATION:  Low back very tight, multiple mm spasms noted   LUMBAR ROM:   AROM eval 07/02/23 07/23/23  Flexion Severe limitation  Mod limitation  Min limitation   Extension Moderate limitation Min limitation  Min limitation   Right lateral flexion WNL     Left lateral flexion WNL    Right rotation WNL     Left rotation WNL      (Blank rows = not tested)   LOWER EXTREMITY MMT:    MMT Right eval Left eval Right 07/23/23 Left 07/23/23  Hip flexion 4 4 5 5   Hip extension      Hip abduction 4+ 4+ 4+ 4+  Hip adduction      Hip internal rotation      Hip external rotation      Knee flexion 4- 4- 5 5  Knee extension 4 4 5 5   Ankle dorsiflexion      Ankle plantarflexion      Ankle inversion      Ankle eversion       (Blank rows = not tested)  LUMBAR SPECIAL TESTS:  Straight leg raise test: Negative  FUNCTIONAL TESTS:  Dynamic Gait Index: 21/24      07/23/23 0001  Dynamic Gait Index  Level Surface 3  Change in Gait Speed 3  Gait with Horizontal Head Turns 2  Gait with Vertical Head Turns 3  Gait and Pivot Turn 3  Step Over Obstacle 3  Step Around Obstacles 3  Steps 3  Total Score 23     TREATMENT DATE:  08/06/23 NuStep L5 X6 min Gait outside in widest part of parking lot up and down slopw Alt cone taps SLS cone taps Very difficultairex on 6in box step ups from airex x10 HS curls 35lb 2x15 Leg Ext 10lb 2x15 Sit to stand LE on double airex 2x10 Side step on and off airex  07/30/23 Bike L 4 x 6 min Alt 6in box taps w/ 30lb posterior pull then lateal pull x10 each  Step ups airex on 6in box x10 each HS curls 45lb 2x10 Leg ext 15# 2x10 Shoulder Ext 10lb standing on airex 2x10 Heel raises 2x10 S2S OHP yellow ball on airex 2x10 Side step on and off airex   07/23/23  MMT, DGI, lumbar ROM,  goal check  Extensive education that he is doing very well with PT/has met most of his goals and is close to maxing out all measures- therapists use skilled objective measures and tests to help determine progress with skilled PT services, needs to show objective progress on a consistent basis to continue with therapy. Maintenance therapy is generally not allowed for by insurances excpt for a couple of exceptions like MS or Parkinsons/neurodegenerative diseases(regardless of visit limits). OK to extend briefly for further fine tune of balance but will DC to long term HEP after that.   Forward and backward heel toe on blue foam pad // bars  Wide tandem on blue air pads 3x30 seconds B Alternating lunges onto BOSU x20    07/18/23 Seated row  25# 2x10 Lat pull down 25# 2x10 Step ups from airex to 6 forwards and laterally NuStep L5x40mins   On airex cone taps  Rockerboard both directions  Balance on BOSU, then reaching for numbers    07/15/23 Bike L4 x97mins  Shoulder ext 10# 2x10  Leg ext 10# 2x10 HS curls 35# 2x10  Walking on beam Tandem and SLS hold on beam  Step up on BOSU  Walk outdoors around back building   07/10/23 NuStep Recheck goals DGI-22/24 Supine LE stretches- HS, ITB, SKTC Feet on pball rotations and knees to chest x10 Bridges 2x10 STS with OHP 2x10 Leg press 20# 2x10                                                                                                                  PATIENT EDUCATION:  Education details: exam findings, POC, HEP  Person educated: Patient Education method: Programmer, multimedia, Demonstration, and Handouts Education comprehension: verbalized understanding, returned demonstration, and needs further education  HOME EXERCISE PROGRAM:  Access Code: [email or web address] URL: [email or web address] Date: 07/04/2023 Prepared by: Josette Rough  Exercises - Supine Single Knee to Chest Stretch  - 2 x daily - 7 x weekly - 1 sets - 5 reps - 5  seconds  hold - Supine Lower Trunk Rotation  - 2 x daily - 7 x weekly - 1 sets - 5 reps - 5 seconds  hold - Hooklying Hamstring Stretch with Strap  - 2 x daily - 7 x weekly - 1 sets - 3 reps - 30 seconds  hold - Supine Transversus Abdominis Bracing - Hands on Stomach  - 5 x daily - 7 x weekly - 1 sets - 10 reps - 3 seconds  hold - Supine Bridge with Resistance Band  - 1 x daily - 5 x weekly - 2 sets - 10 reps - 2 seconds  hold - Clamshell with Resistance  - 1 x daily - 5 x weekly - 2 sets - 10 reps - 1 second  hold - Supine Posterior Pelvic Tilt  - 1 x daily - 7 x weekly - 2 sets - 10 reps - 3 seconds  hold    ASSESSMENT:  CLINICAL IMPRESSION:   Pt enters doing well reporting no pain but an ache in his low back. Cues needed to control gait speed when ambulating down hill. Instability present with alt box taps and SLS. He is really making excellent progress with skilled PT services and is very close to maxing out scores on objective tests/benefit from PT. We can extend for a few more visits to focus on balance, however at that point will have to transition to long term HEP for long term maintenance on his own.    EVAL: Patient is a 78 y.o. M who was seen today for physical therapy evaluation and treatment for LBP. Objectives as above, I think that a lot of his symptoms are related to general deconditioning, joint stiffness, and mm tightness related to sedentary lifestyle. He is mildly unsteady but  not too bad. Anticipate that he will benefit from skilled PT services moving forward.    OBJECTIVE IMPAIRMENTS: decreased activity tolerance, decreased balance, decreased mobility, difficulty walking, decreased ROM, decreased strength, increased fascial restrictions, increased muscle spasms, impaired flexibility, postural dysfunction, and pain.   ACTIVITY LIMITATIONS: carrying, lifting, standing, squatting, transfers, bed mobility, and locomotion level  PARTICIPATION LIMITATIONS: meal prep, cleaning,  shopping, community activity, and yard work  PERSONAL FACTORS: Age, Education, Fitness, Past/current experiences, and Time since onset of injury/illness/exacerbation are also affecting patient's functional outcome.   REHAB POTENTIAL: Good  CLINICAL DECISION MAKING: Stable/uncomplicated  EVALUATION COMPLEXITY: Low   GOALS: Goals reviewed with patient? No  SHORT TERM GOALS: Target date: 08/06/2023      Will be compliant with appropriate progressive HEP GOAL STATUS: PARTIALLY MET 07/02/23 4/7 days and progressing, MET 07/10/23   2. Will demonstrate improved postural awareness with all functional tasks, use of ergonomic aides PRN/as desired GOAL STATUS: ONGOING 07/02/23, MET 07/10/23   3. Will demonstrate good functional biomechanics for bed mobility and floor to waist lifting mechanics  GOAL STATUS: ONGOING 07/02/23, MET 07/10/23  4. Lumbar flexion and extension AROM to be no more than 25% limited  GOAL STATUS: MET 07/02/23   LONG TERM GOALS: Target date: 08/20/2023      MMT to have improved by one grade all weak groups GOAL STATUS: hip flexion, knee flexion and ext 5/5 MET 07/10/23  2. Mm flexibility and spasms to have improved by at least 50% in order to improve functional movement patterns and for pain control GOAL STATUS: MET 50% better 07/10/23   3. Pain to be no more than 1/10 at worst with all functional activities GOAL STATUS: ONGOING 07/23/23 3/10 at worst, 3/10 ongoing 08/06/23  4. Will have improved score on PSFS by at least 2 points to show improved QOL and subjective perception of condition  GOAL STATUS: ONGOING 07/02/23, MET 07/10/23  5. Will score at least 22/24 on DGI  Baseline: 19/24 GOAL STATUS: New as of 07/02/23 22/24 MET 07/10/23  6. Will be able to navigate challenging outdoor surfaces without difficulty to show ability to confidently and safely navigate golf course and construction sites  GOAL STATUS: NEW 07/23/23   PLAN:  PT FREQUENCY: 1x/week  PT DURATION: 4  weeks  PLANNED INTERVENTIONS: 97750- Physical Performance Testing, 97110-Therapeutic exercises, 97530- Therapeutic activity, [id number]- Neuromuscular re-education, 97535- Self Care, [id number]- Manual therapy, [id number]- Aquatic Therapy, 97035- Ultrasound, [id number]- Ionotophoresis 4mg /ml Dexamethasone , Taping, and Dry Needling.  PLAN FOR NEXT SESSION: 3 additional visits with focus on high level balance then hard DC to advanced HEP   Tanda Sorrow, PTA 08/06/23 10:11 AM

## 2023-08-13 DIAGNOSIS — R9082 White matter disease, unspecified: Secondary | ICD-10-CM | POA: Diagnosis not present

## 2023-08-13 DIAGNOSIS — H53452 Other localized visual field defect, left eye: Secondary | ICD-10-CM | POA: Diagnosis not present

## 2023-08-13 DIAGNOSIS — H4010X0 Unspecified open-angle glaucoma, stage unspecified: Secondary | ICD-10-CM | POA: Diagnosis not present

## 2023-08-14 ENCOUNTER — Ambulatory Visit: Admitting: Physical Therapy

## 2023-08-14 ENCOUNTER — Encounter: Payer: Self-pay | Admitting: Physical Therapy

## 2023-08-14 DIAGNOSIS — R2681 Unsteadiness on feet: Secondary | ICD-10-CM

## 2023-08-14 DIAGNOSIS — M6281 Muscle weakness (generalized): Secondary | ICD-10-CM

## 2023-08-14 DIAGNOSIS — M5459 Other low back pain: Secondary | ICD-10-CM

## 2023-08-14 DIAGNOSIS — R29898 Other symptoms and signs involving the musculoskeletal system: Secondary | ICD-10-CM

## 2023-08-14 NOTE — Therapy (Signed)
 OUTPATIENT PHYSICAL THERAPY THORACOLUMBAR TREATMENT     Patient Name: Jeffrey Stein MRN: [id number] DOB:10-May-1945, 78 y.o., male Today's Date: 08/14/2023  END OF SESSION:  PT End of Session - 08/14/23 1033     Visit Number 16    Number of Visits 17    Date for PT Re-Evaluation 08/20/23    Authorization Type [employer] MCR    Authorization Time Period 06/03/23 to 07/29/23; exended to 7/15    Progress Note Due on Visit 20    PT Start Time 1017    PT Stop Time 1055    PT Time Calculation (min) 38 min    Activity Tolerance Patient tolerated treatment well    Behavior During Therapy WFL for tasks assessed/performed                      Past Medical History:  Diagnosis Date   Abnormal liver enzymes    Allergic rhinitis    Allergy    seasonal and environmental   CAD (coronary artery disease)    Cataract    Colon polyps    Diverticulosis    ED (erectile dysfunction)    GERD (gastroesophageal reflux disease)    Heart murmur    20 yrs ago   Hemochromatosis    HTN (hypertension)    Hyperlipemia    Meniere's disease    Prostate cancer (HCC)    Retinal detachment    Past Surgical History:  Procedure Laterality Date   APPENDECTOMY     BASAL CELL CARCINOMA EXCISION  2014   behind right knee and back   cataracts      COLONOSCOPY  2020   COLONOSCOPY WITH PROPOFOL  N/A 09/23/2018   Procedure: COLONOSCOPY WITH PROPOFOL ;  Surgeon: Legrand Victory LITTIE DOUGLAS, MD;  Location: [facility];  Service: Gastroenterology;  Laterality: N/A;   COLONOSCOPY WITH PROPOFOL  N/A 10/26/2019   Procedure: COLONOSCOPY WITH PROPOFOL ;  Surgeon: Legrand Victory LITTIE DOUGLAS, MD;  Location: [facility];  Service: Gastroenterology;  Laterality: N/A;   HEMOSTASIS CLIP PLACEMENT  09/23/2018   Procedure: HEMOSTASIS CLIP PLACEMENT;  Surgeon: Legrand Victory LITTIE DOUGLAS, MD;  Location: [facility];  Service: Gastroenterology;;   HOT HEMOSTASIS N/A 09/23/2018   Procedure: HOT HEMOSTASIS (ARGON PLASMA COAGULATION/BICAP);   Surgeon: Legrand Victory LITTIE DOUGLAS, MD;  Location: [facility];  Service: Gastroenterology;  Laterality: N/A;   HOT HEMOSTASIS N/A 10/26/2019   Procedure: HOT HEMOSTASIS (ARGON PLASMA COAGULATION/BICAP);  Surgeon: Legrand Victory LITTIE DOUGLAS, MD;  Location: [facility];  Service: Gastroenterology;  Laterality: N/A;   POLYPECTOMY     POLYPECTOMY  09/23/2018   Procedure: POLYPECTOMY;  Surgeon: Legrand Victory LITTIE DOUGLAS, MD;  Location: [facility];  Service: Gastroenterology;;   POLYPECTOMY  10/26/2019   Procedure: POLYPECTOMY;  Surgeon: Legrand Victory LITTIE DOUGLAS, MD;  Location: [facility];  Service: Gastroenterology;;   PROSTATECTOMY     RETINAL DETACHMENT SURGERY     SUBMUCOSAL LIFTING INJECTION  09/23/2018   Procedure: SUBMUCOSAL LIFTING INJECTION;  Surgeon: Legrand Victory LITTIE DOUGLAS, MD;  Location: [facility];  Service: Gastroenterology;;   tripple bypass N/A 12/07/2015   Patient Active Problem List   Diagnosis Date Noted   B12 deficiency 06/30/2021   Healthcare maintenance 06/30/2021   Hypotension due to drugs 11/24/2019   Elevated LDL cholesterol level 11/24/2019   History of colonic polyps    Cecal polyp    Motion sickness 09/16/2017   Primary insomnia 04/29/2017   Eczema 04/29/2017   Colon polyp 10/30/2016   Statin  intolerance 04/26/2015   Hyperlipidemia 09/24/2014   Aortic stenosis 09/24/2014   Essential hypertension 03/09/2013   CA of prostate (HCC) 02/13/2013   Abnormal prostate specific antigen 09/13/2011    PCP: Berneta Elsie Sayre MD   REFERRING PROVIDER: Sheril Coy, MD  REFERRING DIAG: LBP   Rationale for Evaluation and Treatment: Rehabilitation  THERAPY DIAG:  Unsteadiness on feet  Other symptoms and signs involving the musculoskeletal system  Muscle weakness (generalized)  Other low back pain  ONSET DATE: chronic- years   SUBJECTIVE:                                                                                                                                                                                            SUBJECTIVE STATEMENT:  Doing good, nothing new     EVAL: My back will start to hurt after standing for about 5 minutes or more. I was walking the other day and noticed that I also had a pain in the back of my thigh. Felt that same pain in the back of my thigh again yesterday. Have some pain up at the top of my back too. Leg/upper back/lower back pain all feel like separate issues, no shooting pains or sciatica that I know of. PA put me on Meloxicam recently. I've had this for years but it comes on faster now. I've been pretty sedentary but trying to be more active, started walking.   PERTINENT HISTORY:  See above   PAIN:  Are you having pain? No 0/10  now   PRECAUTIONS: None  RED FLAGS: None   WEIGHT BEARING RESTRICTIONS: No  FALLS:  Has patient fallen in last 6 months? Yes. Number of falls 1- not sure exactly what happened, have had other falls in the past due to issues with HTN meds   LIVING ENVIRONMENT: Lives with: lives with their spouse Lives in: House/apartment   OCCUPATION: retired  PLOF: Independent, Independent with basic ADLs, Independent with gait, and Independent with transfers  PATIENT GOALS: get rid of pain, learn how to manage it   NEXT MD VISIT: Referring in 6 weeks   OBJECTIVE:  Note: Objective measures were completed at Evaluation unless otherwise noted.   PATIENT SURVEYS:   Patient-Specific Activity Scoring Scheme  0 represents "unable to perform." 10 represents "able to perform at prior level. 0 1 2 3 4 5 6 7 8 9  10 (Date and Score)   Activity Eval  07/02/23  07/10/23  1. Standing for >10 minutes  3 6  7   2. Walking longer distances   8 9  9   3.  Balance skills  7  8 8  4.     5.     Score 6 7.6 8   Total score = sum of the activity scores/number of activities Minimum detectable change (90%CI) for average score = 2 points Minimum detectable change (90%CI) for single activity score = 3  points     COGNITION: Overall cognitive status: Within functional limits for tasks assessed      MUSCLE LENGTH:  EVAL: HS severe limitation B, piriformis mild limitation B, hip flexors mod limitation B   07/23/23: HS moderate limitation B, piriformis WNL B, hip flexors mild limitation B      POSTURE: rounded shoulders and forward head  PALPATION:  Low back very tight, multiple mm spasms noted   LUMBAR ROM:   AROM eval 07/02/23 07/23/23  Flexion Severe limitation  Mod limitation  Min limitation   Extension Moderate limitation Min limitation  Min limitation   Right lateral flexion WNL     Left lateral flexion WNL    Right rotation WNL     Left rotation WNL      (Blank rows = not tested)   LOWER EXTREMITY MMT:    MMT Right eval Left eval Right 07/23/23 Left 07/23/23  Hip flexion 4 4 5 5   Hip extension      Hip abduction 4+ 4+ 4+ 4+  Hip adduction      Hip internal rotation      Hip external rotation      Knee flexion 4- 4- 5 5  Knee extension 4 4 5 5   Ankle dorsiflexion      Ankle plantarflexion      Ankle inversion      Ankle eversion       (Blank rows = not tested)  LUMBAR SPECIAL TESTS:  Straight leg raise test: Negative  FUNCTIONAL TESTS:  Dynamic Gait Index: 21/24      07/23/23 0001  Dynamic Gait Index  Level Surface 3  Change in Gait Speed 3  Gait with Horizontal Head Turns 2  Gait with Vertical Head Turns 3  Gait and Pivot Turn 3  Step Over Obstacle 3  Step Around Obstacles 3  Steps 3  Total Score 23     TREATMENT DATE:   08/14/23  Nustep L6x8 minutes seat 11 all four extremities    One foot on BOSU/other on ground 3x30 seconds B Double cone taps from blue foam pad x10 B Triple cone taps from blue foam pad x10 B  Rocker board AP x2 min Rocker board lateral x2 min  Side steps on blue foam pad with obstacles x4 rounds  Tandem walk on blue foam pad with obstacles x4 rounds  Forward/backward ball rolls in SLS x10 B  Side to side  ball rolls in SLS x10 B     08/06/23 NuStep L5 X6 min Gait outside in widest part of parking lot up and down slopw Alt cone taps SLS cone taps Very difficultairex on 6in box step ups from airex x10 HS curls 35lb 2x15 Leg Ext 10lb 2x15 Sit to stand LE on double airex 2x10 Side step on and off airex  07/30/23 Bike L 4 x 6 min Alt 6in box taps w/ 30lb posterior pull then lateal pull x10 each  Step ups airex on 6in box x10 each HS curls 45lb 2x10 Leg ext 15# 2x10 Shoulder Ext 10lb standing on airex 2x10 Heel raises 2x10 S2S OHP yellow ball on airex 2x10 Side step on and off airex  PATIENT EDUCATION:  Education details: exam findings, POC, HEP  Person educated: Patient Education method: Explanation, Demonstration, and Handouts Education comprehension: verbalized understanding, returned demonstration, and needs further education  HOME EXERCISE PROGRAM:  Access Code: [email or web address] URL: [email or web address] Date: 07/04/2023 Prepared by: Josette Rough  Exercises - Supine Single Knee to Chest Stretch  - 2 x daily - 7 x weekly - 1 sets - 5 reps - 5 seconds  hold - Supine Lower Trunk Rotation  - 2 x daily - 7 x weekly - 1 sets - 5 reps - 5 seconds  hold - Hooklying Hamstring Stretch with Strap  - 2 x daily - 7 x weekly - 1 sets - 3 reps - 30 seconds  hold - Supine Transversus Abdominis Bracing - Hands on Stomach  - 5 x daily - 7 x weekly - 1 sets - 10 reps - 3 seconds  hold - Supine Bridge with Resistance Band  - 1 x daily - 5 x weekly - 2 sets - 10 reps - 2 seconds  hold - Clamshell with Resistance  - 1 x daily - 5 x weekly - 2 sets - 10 reps - 1 second  hold - Supine Posterior Pelvic Tilt  - 1 x daily - 7 x weekly - 2 sets - 10 reps - 3 seconds  hold    ASSESSMENT:  CLINICAL IMPRESSION:  Arrives today doing well, we focused on balance based  interventions this session. Planning for DC to advanced HEP next visit.    EVAL: Patient is a 78 y.o. M who was seen today for physical therapy evaluation and treatment for LBP. Objectives as above, I think that a lot of his symptoms are related to general deconditioning, joint stiffness, and mm tightness related to sedentary lifestyle. He is mildly unsteady but not too bad. Anticipate that he will benefit from skilled PT services moving forward.    OBJECTIVE IMPAIRMENTS: decreased activity tolerance, decreased balance, decreased mobility, difficulty walking, decreased ROM, decreased strength, increased fascial restrictions, increased muscle spasms, impaired flexibility, postural dysfunction, and pain.   ACTIVITY LIMITATIONS: carrying, lifting, standing, squatting, transfers, bed mobility, and locomotion level  PARTICIPATION LIMITATIONS: meal prep, cleaning, shopping, community activity, and yard work  PERSONAL FACTORS: Age, Education, Fitness, Past/current experiences, and Time since onset of injury/illness/exacerbation are also affecting patient's functional outcome.   REHAB POTENTIAL: Good  CLINICAL DECISION MAKING: Stable/uncomplicated  EVALUATION COMPLEXITY: Low   GOALS: Goals reviewed with patient? No  SHORT TERM GOALS: Target date: 08/06/2023      Will be compliant with appropriate progressive HEP GOAL STATUS: PARTIALLY MET 07/02/23 4/7 days and progressing, MET 07/10/23   2. Will demonstrate improved postural awareness with all functional tasks, use of ergonomic aides PRN/as desired GOAL STATUS: ONGOING 07/02/23, MET 07/10/23   3. Will demonstrate good functional biomechanics for bed mobility and floor to waist lifting mechanics  GOAL STATUS: ONGOING 07/02/23, MET 07/10/23  4. Lumbar flexion and extension AROM to be no more than 25% limited  GOAL STATUS: MET 07/02/23   LONG TERM GOALS: Target date: 08/20/2023      MMT to have improved by one grade all weak groups GOAL  STATUS: hip flexion, knee flexion and ext 5/5 MET 07/10/23  2. Mm flexibility and spasms to have improved by at least 50% in order to improve functional movement patterns and for pain control GOAL STATUS: MET 50% better 07/10/23   3. Pain to be no more than 1/10 at worst with all functional activities  GOAL STATUS: ONGOING 07/23/23 3/10 at worst, 3/10 ongoing 08/06/23  4. Will have improved score on PSFS by at least 2 points to show improved QOL and subjective perception of condition  GOAL STATUS: ONGOING 07/02/23, MET 07/10/23  5. Will score at least 22/24 on DGI  Baseline: 19/24 GOAL STATUS: New as of 07/02/23 22/24 MET 07/10/23  6. Will be able to navigate challenging outdoor surfaces without difficulty to show ability to confidently and safely navigate golf course and construction sites  GOAL STATUS: NEW 07/23/23   PLAN:  PT FREQUENCY: 1x/week  PT DURATION: 4 weeks  PLANNED INTERVENTIONS: 97750- Physical Performance Testing, 97110-Therapeutic exercises, 97530- Therapeutic activity, [id number]- Neuromuscular re-education, 97535- Self Care, [id number]- Manual therapy, [id number]- Aquatic Therapy, 97035- Ultrasound, [id number]- Ionotophoresis 4mg /ml Dexamethasone , Taping, and Dry Needling.  PLAN FOR NEXT SESSION: DC next visit, finalize HEP    Josette Rough, PT, DPT 08/14/23 10:56 AM

## 2023-08-20 ENCOUNTER — Encounter: Payer: Self-pay | Admitting: Physical Therapy

## 2023-08-20 ENCOUNTER — Ambulatory Visit: Admitting: Physical Therapy

## 2023-08-20 DIAGNOSIS — R2681 Unsteadiness on feet: Secondary | ICD-10-CM

## 2023-08-20 DIAGNOSIS — M6281 Muscle weakness (generalized): Secondary | ICD-10-CM

## 2023-08-20 DIAGNOSIS — M5459 Other low back pain: Secondary | ICD-10-CM

## 2023-08-20 DIAGNOSIS — R29898 Other symptoms and signs involving the musculoskeletal system: Secondary | ICD-10-CM

## 2023-08-20 NOTE — Therapy (Signed)
 OUTPATIENT PHYSICAL THERAPY THORACOLUMBAR DISCHARGE     Patient Name: Jeffrey Stein MRN: [id number] DOB:Oct 08, 1945, 78 y.o., male Today's Date: 08/20/2023  PHYSICAL THERAPY DISCHARGE SUMMARY  Visits from Start of Care: 17  Current functional level related to goals / functional outcomes: See below    Remaining deficits: See below    Education / Equipment: See below    Patient agrees to discharge. Patient goals were partially met. Patient is being discharged due to being pleased with the current functional level.      END OF SESSION:  PT End of Session - 08/20/23 1041     Visit Number 17    Number of Visits 17    Date for PT Re-Evaluation 08/20/23    Authorization Type [employer] MCR    Authorization Time Period 06/03/23 to 07/29/23; exended to 7/15    Progress Note Due on Visit 20    PT Start Time 1017    PT Stop Time 1057    PT Time Calculation (min) 40 min    Activity Tolerance Patient tolerated treatment well    Behavior During Therapy WFL for tasks assessed/performed                       Past Medical History:  Diagnosis Date   Abnormal liver enzymes    Allergic rhinitis    Allergy    seasonal and environmental   CAD (coronary artery disease)    Cataract    Colon polyps    Diverticulosis    ED (erectile dysfunction)    GERD (gastroesophageal reflux disease)    Heart murmur    20 yrs ago   Hemochromatosis    HTN (hypertension)    Hyperlipemia    Meniere's disease    Prostate cancer (HCC)    Retinal detachment    Past Surgical History:  Procedure Laterality Date   APPENDECTOMY     BASAL CELL CARCINOMA EXCISION  2014   behind right knee and back   cataracts      COLONOSCOPY  2020   COLONOSCOPY WITH PROPOFOL  N/A 09/23/2018   Procedure: COLONOSCOPY WITH PROPOFOL ;  Surgeon: Legrand Victory LITTIE DOUGLAS, MD;  Location: [facility];  Service: Gastroenterology;  Laterality: N/A;   COLONOSCOPY WITH PROPOFOL  N/A 10/26/2019   Procedure: COLONOSCOPY  WITH PROPOFOL ;  Surgeon: Legrand Victory LITTIE DOUGLAS, MD;  Location: [facility];  Service: Gastroenterology;  Laterality: N/A;   HEMOSTASIS CLIP PLACEMENT  09/23/2018   Procedure: HEMOSTASIS CLIP PLACEMENT;  Surgeon: Legrand Victory LITTIE DOUGLAS, MD;  Location: [facility];  Service: Gastroenterology;;   HOT HEMOSTASIS N/A 09/23/2018   Procedure: HOT HEMOSTASIS (ARGON PLASMA COAGULATION/BICAP);  Surgeon: Legrand Victory LITTIE DOUGLAS, MD;  Location: [facility];  Service: Gastroenterology;  Laterality: N/A;   HOT HEMOSTASIS N/A 10/26/2019   Procedure: HOT HEMOSTASIS (ARGON PLASMA COAGULATION/BICAP);  Surgeon: Legrand Victory LITTIE DOUGLAS, MD;  Location: [facility];  Service: Gastroenterology;  Laterality: N/A;   POLYPECTOMY     POLYPECTOMY  09/23/2018   Procedure: POLYPECTOMY;  Surgeon: Legrand Victory LITTIE DOUGLAS, MD;  Location: [facility];  Service: Gastroenterology;;   POLYPECTOMY  10/26/2019   Procedure: POLYPECTOMY;  Surgeon: Legrand Victory LITTIE DOUGLAS, MD;  Location: [facility];  Service: Gastroenterology;;   PROSTATECTOMY     RETINAL DETACHMENT SURGERY     SUBMUCOSAL LIFTING INJECTION  09/23/2018   Procedure: SUBMUCOSAL LIFTING INJECTION;  Surgeon: Legrand Victory LITTIE DOUGLAS, MD;  Location: [facility];  Service: Gastroenterology;;   tripple bypass N/A 12/07/2015  Patient Active Problem List   Diagnosis Date Noted   B12 deficiency 06/30/2021   Healthcare maintenance 06/30/2021   Hypotension due to drugs 11/24/2019   Elevated LDL cholesterol level 11/24/2019   History of colonic polyps    Cecal polyp    Motion sickness 09/16/2017   Primary insomnia 04/29/2017   Eczema 04/29/2017   Colon polyp 10/30/2016   Statin intolerance 04/26/2015   Hyperlipidemia 09/24/2014   Aortic stenosis 09/24/2014   Essential hypertension 03/09/2013   CA of prostate (HCC) 02/13/2013   Abnormal prostate specific antigen 09/13/2011    PCP: Berneta Elsie Sayre MD   REFERRING PROVIDER: Sheril Coy, MD  REFERRING DIAG: LBP   Rationale for  Evaluation and Treatment: Rehabilitation  THERAPY DIAG:  Unsteadiness on feet  Other symptoms and signs involving the musculoskeletal system  Muscle weakness (generalized)  Other low back pain  ONSET DATE: chronic- years   SUBJECTIVE:                                                                                                                                                                                           SUBJECTIVE STATEMENT:  Not much new, feeling good. Walked some this weekend but it was hard due to the weather with rain and humidity    EVAL: My back will start to hurt after standing for about 5 minutes or more. I was walking the other day and noticed that I also had a pain in the back of my thigh. Felt that same pain in the back of my thigh again yesterday. Have some pain up at the top of my back too. Leg/upper back/lower back pain all feel like separate issues, no shooting pains or sciatica that I know of. PA put me on Meloxicam recently. I've had this for years but it comes on faster now. I've been pretty sedentary but trying to be more active, started walking.   PERTINENT HISTORY:  See above   PAIN:  Are you having pain? No 0/10  today   PRECAUTIONS: None  RED FLAGS: None   WEIGHT BEARING RESTRICTIONS: No  FALLS:  Has patient fallen in last 6 months? Yes. Number of falls 1- not sure exactly what happened, have had other falls in the past due to issues with HTN meds   LIVING ENVIRONMENT: Lives with: lives with their spouse Lives in: House/apartment   OCCUPATION: retired  PLOF: Independent, Independent with basic ADLs, Independent with gait, and Independent with transfers  PATIENT GOALS: get rid of pain, learn how to manage it   NEXT MD VISIT: Referring in 6 weeks   OBJECTIVE:  Note: Objective  measures were completed at Evaluation unless otherwise noted.   PATIENT SURVEYS:   Patient-Specific Activity Scoring Scheme  0 represents "unable to  perform." 10 represents "able to perform at prior level. 0 1 2 3 4 5 6 7 8 9  10 (Date and Score)   Activity Eval  07/02/23  07/10/23 08/20/23 DC  1. Standing for >10 minutes  3 6  7 8   2. Walking longer distances   8 9  9 9   3.  Balance skills  7 8 8 6   4.      5.      Score 6 7.6 8 7.6   Total score = sum of the activity scores/number of activities Minimum detectable change (90%CI) for average score = 2 points Minimum detectable change (90%CI) for single activity score = 3 points     COGNITION: Overall cognitive status: Within functional limits for tasks assessed      MUSCLE LENGTH:  EVAL: HS severe limitation B, piriformis mild limitation B, hip flexors mod limitation B   07/23/23: HS moderate limitation B, piriformis WNL B, hip flexors mild limitation B  08/20/23  HS mod-severe limitation B, piriformis WNL B, hip flexors mild limitation  B    POSTURE: rounded shoulders and forward head  PALPATION:  Low back very tight, multiple mm spasms noted   LUMBAR ROM:   AROM eval 07/02/23 07/23/23  Flexion Severe limitation  Mod limitation  Min limitation   Extension Moderate limitation Min limitation  Min limitation   Right lateral flexion WNL     Left lateral flexion WNL    Right rotation WNL     Left rotation WNL      (Blank rows = not tested)   LOWER EXTREMITY MMT:    MMT Right eval Left eval Right 07/23/23 Left 07/23/23 Right 08/20/23 Left 08/20/23  Hip flexion 4 4 5 5 5 5   Hip extension        Hip abduction 4+ 4+ 4+ 4+ 5 5  Hip adduction        Hip internal rotation        Hip external rotation        Knee flexion 4- 4- 5 5 5 5   Knee extension 4 4 5 5 5 5   Ankle dorsiflexion        Ankle plantarflexion        Ankle inversion        Ankle eversion         (Blank rows = not tested)  LUMBAR SPECIAL TESTS:  Straight leg raise test: Negative  FUNCTIONAL TESTS:     08/20/23 0001  Dynamic Gait Index  Level Surface 3  Change in Gait Speed 3  Gait  with Horizontal Head Turns 3  Gait with Vertical Head Turns 3  Gait and Pivot Turn 3  Step Over Obstacle 3  Step Around Obstacles 3  Steps 3  Total Score 24      TREATMENT DATE:   08/20/23  PSFS, MMT, mm flexibility, DGI, goal check  Education on working with group exercise classes and potentially personal trainers at [facility] to help promote enjoyable, sustainable physical activity moving forward.   HEP additions: - tandem stance solid surface 3x30 seconds B  -tandem walks solid surface x3 laps in // bars - STS 20 seconds x3 B -TA set + standing march x10    08/14/23  Nustep L6x8 minutes seat 11 all four extremities    One foot on BOSU/other  on ground 3x30 seconds B Double cone taps from blue foam pad x10 B Triple cone taps from blue foam pad x10 B  Rocker board AP x2 min Rocker board lateral x2 min  Side steps on blue foam pad with obstacles x4 rounds  Tandem walk on blue foam pad with obstacles x4 rounds  Forward/backward ball rolls in SLS x10 B  Side to side ball rolls in SLS x10 B     08/06/23 NuStep L5 X6 min Gait outside in widest part of parking lot up and down slopw Alt cone taps SLS cone taps Very difficultairex on 6in box step ups from airex x10 HS curls 35lb 2x15 Leg Ext 10lb 2x15 Sit to stand LE on double airex 2x10 Side step on and off airex  07/30/23 Bike L 4 x 6 min Alt 6in box taps w/ 30lb posterior pull then lateal pull x10 each  Step ups airex on 6in box x10 each HS curls 45lb 2x10 Leg ext 15# 2x10 Shoulder Ext 10lb standing on airex 2x10 Heel raises 2x10 S2S OHP yellow ball on airex 2x10 Side step on and off airex                                                                                                                   PATIENT EDUCATION:  Education details: exam findings, POC, HEP  Person educated: Patient Education method: Programmer, multimedia, Demonstration, and Handouts Education comprehension: verbalized understanding,  returned demonstration, and needs further education  HOME EXERCISE PROGRAM:  Access Code: [email or web address] URL: [email or web address] Date: 08/20/2023 Prepared by: Josette Rough  Exercises - Supine Single Knee to Chest Stretch  - 2 x daily - 7 x weekly - 1 sets - 5 reps - 5 seconds  hold - Supine Lower Trunk Rotation  - 2 x daily - 7 x weekly - 1 sets - 5 reps - 5 seconds  hold - Hooklying Hamstring Stretch with Strap  - 2 x daily - 7 x weekly - 1 sets - 3 reps - 30 seconds  hold - Supine Transversus Abdominis Bracing - Hands on Stomach  - 5 x daily - 7 x weekly - 1 sets - 10 reps - 3 seconds  hold - Supine Bridge with Resistance Band  - 1 x daily - 5 x weekly - 2 sets - 10 reps - 2 seconds  hold - Clamshell with Resistance  - 1 x daily - 5 x weekly - 2 sets - 10 reps - 1 second  hold - Supine Posterior Pelvic Tilt  - 1 x daily - 7 x weekly - 2 sets - 10 reps - 3 seconds  hold - Tandem Stance in Corner  - 1 x daily - 7 x weekly - 1 sets - 6 reps - 30 seconds  hold - Tandem Walking with Counter Support  - 1 x daily - 7 x weekly - 1 sets - 15 reps - Single Leg Stance with Support  - 1 x daily - 7 x weekly -  1 sets - 6 reps - 15-30 seconds  hold - Standing Marching  - 1 x daily - 7 x weekly - 1 sets - 10 reps    ASSESSMENT:  CLINICAL IMPRESSION:  Arrives today doing well, we got updated DC measures and discussed progress, otherwise focused on building DC HEP. Really encouraged ongoing exercise and possibly trying group classes and working with [facility] trainers to maintain functional gains from PT. DC today, thank you for the referral!    EVAL: Patient is a 78 y.o. M who was seen today for physical therapy evaluation and treatment for LBP. Objectives as above, I think that a lot of his symptoms are related to general deconditioning, joint stiffness, and mm tightness related to sedentary lifestyle. He is mildly unsteady but not too bad. Anticipate that he will benefit from skilled PT  services moving forward.    OBJECTIVE IMPAIRMENTS: decreased activity tolerance, decreased balance, decreased mobility, difficulty walking, decreased ROM, decreased strength, increased fascial restrictions, increased muscle spasms, impaired flexibility, postural dysfunction, and pain.   ACTIVITY LIMITATIONS: carrying, lifting, standing, squatting, transfers, bed mobility, and locomotion level  PARTICIPATION LIMITATIONS: meal prep, cleaning, shopping, community activity, and yard work  PERSONAL FACTORS: Age, Education, Fitness, Past/current experiences, and Time since onset of injury/illness/exacerbation are also affecting patient's functional outcome.   REHAB POTENTIAL: Good  CLINICAL DECISION MAKING: Stable/uncomplicated  EVALUATION COMPLEXITY: Low   GOALS: Goals reviewed with patient? No  SHORT TERM GOALS: Target date: 08/06/2023      Will be compliant with appropriate progressive HEP GOAL STATUS: PARTIALLY MET 07/02/23 4/7 days and progressing, MET 07/10/23   2. Will demonstrate improved postural awareness with all functional tasks, use of ergonomic aides PRN/as desired GOAL STATUS: ONGOING 07/02/23, MET 07/10/23   3. Will demonstrate good functional biomechanics for bed mobility and floor to waist lifting mechanics  GOAL STATUS: ONGOING 07/02/23, MET 07/10/23  4. Lumbar flexion and extension AROM to be no more than 25% limited  GOAL STATUS: MET 07/02/23   LONG TERM GOALS: Target date: 08/20/2023      MMT to have improved by one grade all weak groups GOAL STATUS: hip flexion, knee flexion and ext 5/5 MET 07/10/23  2. Mm flexibility and spasms to have improved by at least 50% in order to improve functional movement patterns and for pain control GOAL STATUS: MET 50% better 07/10/23   3. Pain to be no more than 1/10 at worst with all functional activities GOAL STATUS: NOT MET 08/20/23 depends on the day, up to 4/10 at worst   4. Will have improved score on PSFS by at least 2  points to show improved QOL and subjective perception of condition  GOAL STATUS: PARTIALLY MET 08/20/23 see above   5. Will score at least 22/24 on DGI  Baseline: 19/24 GOAL STATUS: New as of 07/02/23 22/24 MET 07/10/23  6. Will be able to navigate challenging outdoor surfaces without difficulty to show ability to confidently and safely navigate golf course and construction sites  GOAL STATUS: PARTIALLY MET 08/20/23 depends on the surface but better than it was    PLAN:  PT FREQUENCY: 1x/week  PT DURATION: 4 weeks  PLANNED INTERVENTIONS: 97750- Physical Performance Testing, 97110-Therapeutic exercises, 97530- Therapeutic activity, [id number]- Neuromuscular re-education, 97535- Self Care, 02859- Manual therapy, [id number]- Aquatic Therapy, 97035- Ultrasound, 02966- Ionotophoresis 4mg /ml Dexamethasone , Taping, and Dry Needling.  PLAN FOR NEXT SESSION: DC today   Josette Rough, PT, DPT 08/20/23 11:01 AM

## 2023-08-20 NOTE — Progress Notes (Signed)
   08/20/23 0001  Dynamic Gait Index  Level Surface 3  Change in Gait Speed 3  Gait with Horizontal Head Turns 3  Gait with Vertical Head Turns 3  Gait and Pivot Turn 3  Step Over Obstacle 3  Step Around Obstacles 3  Steps 3  Total Score 24

## 2023-08-27 DIAGNOSIS — I9789 Other postprocedural complications and disorders of the circulatory system, not elsewhere classified: Secondary | ICD-10-CM | POA: Diagnosis not present

## 2023-08-27 DIAGNOSIS — I350 Nonrheumatic aortic (valve) stenosis: Secondary | ICD-10-CM | POA: Diagnosis not present

## 2023-08-27 DIAGNOSIS — I2510 Atherosclerotic heart disease of native coronary artery without angina pectoris: Secondary | ICD-10-CM | POA: Diagnosis not present

## 2023-08-27 DIAGNOSIS — I4891 Unspecified atrial fibrillation: Secondary | ICD-10-CM | POA: Diagnosis not present

## 2023-08-27 DIAGNOSIS — E7800 Pure hypercholesterolemia, unspecified: Secondary | ICD-10-CM | POA: Diagnosis not present

## 2023-08-27 DIAGNOSIS — I10 Essential (primary) hypertension: Secondary | ICD-10-CM | POA: Diagnosis not present

## 2023-08-29 ENCOUNTER — Ambulatory Visit (INDEPENDENT_AMBULATORY_CARE_PROVIDER_SITE_OTHER): Payer: Medicare HMO | Admitting: Family Medicine

## 2023-08-29 ENCOUNTER — Encounter: Payer: Self-pay | Admitting: Family Medicine

## 2023-08-29 VITALS — BP 108/68 | HR 73 | Temp 97.5°F | Ht 71.0 in | Wt 195.2 lb

## 2023-08-29 DIAGNOSIS — E7800 Pure hypercholesterolemia, unspecified: Secondary | ICD-10-CM

## 2023-08-29 DIAGNOSIS — R7303 Prediabetes: Secondary | ICD-10-CM | POA: Diagnosis not present

## 2023-08-29 DIAGNOSIS — I10 Essential (primary) hypertension: Secondary | ICD-10-CM | POA: Diagnosis not present

## 2023-08-29 LAB — CBC
HCT: 39.5 % (ref 39.0–52.0)
Hemoglobin: 13.4 g/dL (ref 13.0–17.0)
MCHC: 33.9 g/dL (ref 30.0–36.0)
MCV: 97.5 fl (ref 78.0–100.0)
Platelets: 117 K/uL — ABNORMAL LOW (ref 150.0–400.0)
RBC: 4.05 Mil/uL — ABNORMAL LOW (ref 4.22–5.81)
RDW: 13.6 % (ref 11.5–15.5)
WBC: 4.4 K/uL (ref 4.0–10.5)

## 2023-08-29 LAB — COMPREHENSIVE METABOLIC PANEL WITH GFR
ALT: 40 U/L (ref 0–53)
AST: 50 U/L — ABNORMAL HIGH (ref 0–37)
Albumin: 3.9 g/dL (ref 3.5–5.2)
Alkaline Phosphatase: 51 U/L (ref 39–117)
BUN: 29 mg/dL — ABNORMAL HIGH (ref 6–23)
CO2: 27 meq/L (ref 19–32)
Calcium: 9.4 mg/dL (ref 8.4–10.5)
Chloride: 105 meq/L (ref 96–112)
Creatinine, Ser: 1.35 mg/dL (ref 0.40–1.50)
GFR: 50.61 mL/min — ABNORMAL LOW (ref >60.00)
Glucose, Bld: 121 mg/dL — ABNORMAL HIGH (ref 70–99)
Potassium: 3 meq/L — ABNORMAL LOW (ref 3.5–5.1)
Sodium: 139 meq/L (ref 135–145)
Total Bilirubin: 1.1 mg/dL (ref 0.2–1.2)
Total Protein: 7.7 g/dL (ref 6.0–8.3)

## 2023-08-29 LAB — LIPID PANEL
Cholesterol: 97 mg/dL (ref 0–200)
HDL: 32.4 mg/dL — ABNORMAL LOW (ref >39.00)
LDL Cholesterol: 47 mg/dL (ref 0–99)
NonHDL: 64.53
Total CHOL/HDL Ratio: 3
Triglycerides: 90 mg/dL (ref 0.0–149.0)
VLDL: 18 mg/dL (ref 0.0–40.0)

## 2023-08-29 LAB — HEMOGLOBIN A1C: Hgb A1c MFr Bld: 6.5 % (ref 4.6–6.5)

## 2023-08-29 NOTE — Progress Notes (Addendum)
 Established Patient Office Visit   Subjective:  Patient ID: Jeffrey Stein, male    DOB: 09-Aug-1945  Age: 78 y.o. MRN: [id number]  Chief Complaint  Patient presents with   Medical Management of Chronic Issues    6 month follow up. Pt is fasting.     HPI Encounter Diagnoses  Name Primary?   Prediabetes Yes   Essential hypertension    Elevated LDL cholesterol level    For follow-up of above.  Has been exercising regularly and been able to lose some weight.  Blood pressure controlled well with losartan  and carvedilol .  Elevated LDL cholesterol treated with rosuvastatin .   Review of Systems  Constitutional: Negative.   HENT: Negative.    Eyes:  Negative for blurred vision, discharge and redness.  Respiratory: Negative.    Cardiovascular: Negative.   Gastrointestinal:  Negative for abdominal pain.  Genitourinary: Negative.   Musculoskeletal: Negative.  Negative for myalgias.  Skin:  Negative for rash.  Neurological:  Negative for tingling, loss of consciousness and weakness.  Endo/Heme/Allergies:  Negative for polydipsia.     Current Outpatient Medications:    aspirin EC 81 MG tablet, Take 81 mg by mouth at bedtime. , Disp: , Rfl:    brimonidine (ALPHAGAN) 0.2 % ophthalmic solution, Place 1 drop into both eyes 3 (three) times daily., Disp: , Rfl:    carvedilol  (COREG ) 6.25 MG tablet, Take 1 tablet (6.25 mg total) by mouth 2 (two) times daily., Disp: 180 tablet, Rfl: 3   Copper  Gluconate 2 MG CAPS, Take 1 capsule by mouth daily., Disp: , Rfl:    cyanocobalamin  1000 MCG tablet, Take by mouth., Disp: , Rfl:    fluticasone (FLONASE) 50 MCG/ACT nasal spray, Place 2 sprays into both nostrils daily., Disp: , Rfl:    losartan  (COZAAR ) 25 MG tablet, Take 25 mg by mouth daily., Disp: , Rfl:    metFORMIN  (GLUCOPHAGE -XR) 500 MG 24 hr tablet, Take 1 tablet (500 mg total) by mouth at bedtime., Disp: 90 tablet, Rfl: 1   Multiple Vitamins-Minerals (SYSTANE ICAPS AREDS2) TABS, Take by  mouth., Disp: , Rfl:    Polyethyl Glycol-Propyl Glycol (LUBRICANT EYE DROPS) 0.4-0.3 % SOLN, Place 1 drop into both eyes 3 (three) times daily as needed (dry/irritated eyes.)., Disp: , Rfl:    potassium chloride  (KLOR-CON ) 20 MEQ packet, Take 20 mEq by mouth once., Disp: , Rfl:    rosuvastatin  (CRESTOR ) 20 MG tablet, Take 20 mg by mouth every evening. , Disp: , Rfl:    trospium (SANCTURA) 20 MG tablet, Take 20 mg by mouth 2 (two) times daily., Disp: , Rfl:    vitamin E 400 UNIT capsule, Take 800 Units by mouth daily.  (Patient not taking: Reported on 08/29/2023), Disp: , Rfl:   Current Facility-Administered Medications:    0.9 %  sodium chloride  infusion, 500 mL, Intravenous, Once, Danis, Victory CROME III, MD   Objective:     BP 108/68 (Cuff Size: Normal)   Pulse 73   Temp (!) 97.5 F (36.4 C) (Temporal)   Ht 5' 11 (1.803 m)   Wt 195 lb 3.2 oz (88.5 kg)   SpO2 93%   BMI 27.22 kg/m  Wt Readings from Last 3 Encounters:  08/29/23 195 lb 3.2 oz (88.5 kg)  02/28/23 199 lb 12.8 oz (90.6 kg)  08/24/22 201 lb 12.8 oz (91.5 kg)      Physical Exam Constitutional:      General: He is not in acute distress.    Appearance: Normal  appearance. He is not ill-appearing, toxic-appearing or diaphoretic.  HENT:     Head: Normocephalic and atraumatic.     Right Ear: Tympanic membrane, ear canal and external ear normal.     Left Ear: Tympanic membrane, ear canal and external ear normal.     Mouth/Throat:     Mouth: Mucous membranes are moist.     Pharynx: Oropharynx is clear. No oropharyngeal exudate or posterior oropharyngeal erythema.  Eyes:     General: No scleral icterus.       Right eye: No discharge.        Left eye: No discharge.     Extraocular Movements: Extraocular movements intact.     Conjunctiva/sclera: Conjunctivae normal.     Pupils: Pupils are equal, round, and reactive to light.  Cardiovascular:     Rate and Rhythm: Normal rate and regular rhythm.  Pulmonary:     Effort:  Pulmonary effort is normal. No respiratory distress.     Breath sounds: Normal breath sounds. No wheezing or rales.  Abdominal:     General: Bowel sounds are normal.  Musculoskeletal:     Cervical back: No rigidity or tenderness.     Right lower leg: No edema.     Left lower leg: No edema.  Lymphadenopathy:     Cervical: No cervical adenopathy.  Skin:    General: Skin is warm and dry.  Neurological:     Mental Status: He is alert and oriented to person, place, and time.  Psychiatric:        Mood and Affect: Mood normal.        Behavior: Behavior normal.      Results for orders placed or performed in visit on 08/29/23  Hemoglobin A1c  Result Value Ref Range   Hgb A1c MFr Bld 6.5 4.6 - 6.5 %  CBC  Result Value Ref Range   WBC 4.4 4.0 - 10.5 K/uL   RBC 4.05 (L) 4.22 - 5.81 Mil/uL   Platelets 117.0 (L) 150.0 - 400.0 K/uL   Hemoglobin 13.4 13.0 - 17.0 g/dL   HCT 60.4 60.9 - 47.9 %   MCV 97.5 78.0 - 100.0 fl   MCHC 33.9 30.0 - 36.0 g/dL   RDW 86.3 88.4 - 84.4 %  Comprehensive metabolic panel with GFR  Result Value Ref Range   Sodium 139 135 - 145 mEq/L   Potassium 3.0 (L) 3.5 - 5.1 mEq/L   Chloride 105 96 - 112 mEq/L   CO2 27 19 - 32 mEq/L   Glucose, Bld 121 (H) 70 - 99 mg/dL   BUN 29 (H) 6 - 23 mg/dL   Creatinine, Ser 8.64 0.40 - 1.50 mg/dL   Total Bilirubin 1.1 0.2 - 1.2 mg/dL   Alkaline Phosphatase 51 39 - 117 U/L   AST 50 (H) 0 - 37 U/L   ALT 40 0 - 53 U/L   Total Protein 7.7 6.0 - 8.3 g/dL   Albumin 3.9 3.5 - 5.2 g/dL   GFR 49.38 (L) >39.99 mL/min   Calcium  9.4 8.4 - 10.5 mg/dL  Lipid panel  Result Value Ref Range   Cholesterol 97 0 - 200 mg/dL   Triglycerides 09.9 0.0 - 149.0 mg/dL   HDL 67.59 (L) >60.99 mg/dL   VLDL 81.9 0.0 - 59.9 mg/dL   LDL Cholesterol 47 0 - 99 mg/dL   Total CHOL/HDL Ratio 3    NonHDL 64.53       The ASCVD Risk score (Arnett DK, et al.,  2019) failed to calculate for the following reasons:   The valid total cholesterol range is  130 to 320 mg/dL    Assessment & Plan:   Prediabetes -     Hemoglobin A1c -     Comprehensive metabolic panel with GFR -     metFORMIN  HCl ER; Take 1 tablet (500 mg total) by mouth at bedtime.  Dispense: 90 tablet; Refill: 1  Essential hypertension -     CBC -     Comprehensive metabolic panel with GFR  Elevated LDL cholesterol level -     Comprehensive metabolic panel with GFR -     Lipid panel    Return in about 3 months (around 11/29/2023).  Information was given on preventing type 2 diabetes as well as metformin .  We discussed side effects.  Will start if A1c is in the diabetic range.  He is trying to avoid more medication.  Recommended 30 minutes of exercise daily.  Will moderate intake of simple carbohydrates and avoid all sugary drinks.  Elsie Sim Lent, MD

## 2023-08-30 ENCOUNTER — Ambulatory Visit: Payer: Self-pay | Admitting: Family Medicine

## 2023-09-02 MED ORDER — METFORMIN HCL ER 500 MG PO TB24: 500.0000 mg | ORAL_TABLET | Freq: Every evening | ORAL | 1 refills | Status: DC

## 2023-09-02 NOTE — Addendum Note (Signed)
 Addended by: BERNETA ELSIE LABOR on: 09/02/2023 03:09 PM   Modules accepted: Orders

## 2023-09-13 DIAGNOSIS — H04123 Dry eye syndrome of bilateral lacrimal glands: Secondary | ICD-10-CM | POA: Diagnosis not present

## 2023-09-13 DIAGNOSIS — D649 Anemia, unspecified: Secondary | ICD-10-CM | POA: Diagnosis not present

## 2023-09-13 DIAGNOSIS — Q142 Congenital malformation of optic disc: Secondary | ICD-10-CM | POA: Diagnosis not present

## 2023-09-13 DIAGNOSIS — E538 Deficiency of other specified B group vitamins: Secondary | ICD-10-CM | POA: Diagnosis not present

## 2023-09-13 DIAGNOSIS — D61818 Other pancytopenia: Secondary | ICD-10-CM | POA: Diagnosis not present

## 2023-09-13 DIAGNOSIS — H5340 Unspecified visual field defects: Secondary | ICD-10-CM | POA: Diagnosis not present

## 2023-09-13 DIAGNOSIS — H40021 Open angle with borderline findings, high risk, right eye: Secondary | ICD-10-CM | POA: Diagnosis not present

## 2023-09-13 DIAGNOSIS — E610 Copper deficiency: Secondary | ICD-10-CM | POA: Diagnosis not present

## 2023-09-13 DIAGNOSIS — G43109 Migraine with aura, not intractable, without status migrainosus: Secondary | ICD-10-CM | POA: Diagnosis not present

## 2023-09-13 DIAGNOSIS — Z961 Presence of intraocular lens: Secondary | ICD-10-CM | POA: Diagnosis not present

## 2023-09-13 DIAGNOSIS — H401122 Primary open-angle glaucoma, left eye, moderate stage: Secondary | ICD-10-CM | POA: Diagnosis not present

## 2023-09-13 DIAGNOSIS — Z8669 Personal history of other diseases of the nervous system and sense organs: Secondary | ICD-10-CM | POA: Diagnosis not present

## 2023-09-16 DIAGNOSIS — D61818 Other pancytopenia: Secondary | ICD-10-CM | POA: Diagnosis not present

## 2023-10-03 DIAGNOSIS — H401122 Primary open-angle glaucoma, left eye, moderate stage: Secondary | ICD-10-CM | POA: Diagnosis not present

## 2023-10-22 DIAGNOSIS — Z23 Encounter for immunization: Secondary | ICD-10-CM | POA: Diagnosis not present

## 2023-10-22 DIAGNOSIS — C61 Malignant neoplasm of prostate: Secondary | ICD-10-CM | POA: Diagnosis not present

## 2023-11-11 DIAGNOSIS — H5340 Unspecified visual field defects: Secondary | ICD-10-CM | POA: Diagnosis not present

## 2023-11-11 DIAGNOSIS — H40021 Open angle with borderline findings, high risk, right eye: Secondary | ICD-10-CM | POA: Diagnosis not present

## 2023-11-11 DIAGNOSIS — G43109 Migraine with aura, not intractable, without status migrainosus: Secondary | ICD-10-CM | POA: Diagnosis not present

## 2023-11-11 DIAGNOSIS — H401122 Primary open-angle glaucoma, left eye, moderate stage: Secondary | ICD-10-CM | POA: Diagnosis not present

## 2023-11-14 DIAGNOSIS — G43109 Migraine with aura, not intractable, without status migrainosus: Secondary | ICD-10-CM | POA: Diagnosis not present

## 2023-11-14 DIAGNOSIS — H04123 Dry eye syndrome of bilateral lacrimal glands: Secondary | ICD-10-CM | POA: Diagnosis not present

## 2023-11-14 DIAGNOSIS — H4010X0 Unspecified open-angle glaucoma, stage unspecified: Secondary | ICD-10-CM | POA: Diagnosis not present

## 2023-11-14 DIAGNOSIS — Q142 Congenital malformation of optic disc: Secondary | ICD-10-CM | POA: Diagnosis not present

## 2023-11-14 DIAGNOSIS — H353132 Nonexudative age-related macular degeneration, bilateral, intermediate dry stage: Secondary | ICD-10-CM | POA: Diagnosis not present

## 2023-11-14 DIAGNOSIS — Z961 Presence of intraocular lens: Secondary | ICD-10-CM | POA: Diagnosis not present

## 2023-12-03 ENCOUNTER — Ambulatory Visit (INDEPENDENT_AMBULATORY_CARE_PROVIDER_SITE_OTHER): Admitting: Family Medicine

## 2023-12-03 ENCOUNTER — Encounter: Payer: Self-pay | Admitting: Family Medicine

## 2023-12-03 VITALS — BP 129/78 | HR 86 | Temp 96.6°F | Ht 71.0 in | Wt 200.2 lb

## 2023-12-03 DIAGNOSIS — E876 Hypokalemia: Secondary | ICD-10-CM

## 2023-12-03 DIAGNOSIS — R7303 Prediabetes: Secondary | ICD-10-CM

## 2023-12-03 DIAGNOSIS — E7800 Pure hypercholesterolemia, unspecified: Secondary | ICD-10-CM | POA: Diagnosis not present

## 2023-12-03 DIAGNOSIS — I10 Essential (primary) hypertension: Secondary | ICD-10-CM

## 2023-12-03 LAB — BASIC METABOLIC PANEL WITH GFR
BUN: 14 mg/dL (ref 6–23)
CO2: 25 meq/L (ref 19–32)
Calcium: 9.5 mg/dL (ref 8.4–10.5)
Chloride: 106 meq/L (ref 96–112)
Creatinine, Ser: 0.98 mg/dL (ref 0.40–1.50)
GFR: 74.2 mL/min (ref >60.00)
Glucose, Bld: 127 mg/dL — ABNORMAL HIGH (ref 70–99)
Potassium: 4.6 meq/L (ref 3.5–5.1)
Sodium: 138 meq/L (ref 135–145)

## 2023-12-03 LAB — HEMOGLOBIN A1C: Hgb A1c MFr Bld: 6 % (ref 4.6–6.5)

## 2023-12-03 NOTE — Progress Notes (Signed)
 Established Patient Office Visit   Subjective:  Patient ID: Jeffrey Stein, male    DOB: [birth year]  Age: 78 y.o. MRN: [id number]  Chief Complaint  Patient presents with   Medical Management of Chronic Issues    3 mon f/u. No questions or concerns. Pt has been fasting.    HPI Encounter Diagnoses  Name Primary?   Essential hypertension Yes   Elevated LDL cholesterol level    Prediabetes    Hypokalemia    For follow-up of above.  Continues losartan  and carvedilol  for well-controlled blood pressure.  Cholesterol controlled with rosuvastatin .  He is having no issues taking metformin .  He is taking his potassium daily.   Review of Systems  Constitutional: Negative.   HENT: Negative.    Eyes:  Negative for blurred vision, discharge and redness.  Respiratory: Negative.    Cardiovascular: Negative.   Gastrointestinal:  Negative for abdominal pain.  Genitourinary: Negative.   Musculoskeletal: Negative.  Negative for myalgias.  Skin:  Negative for rash.  Neurological:  Negative for tingling, loss of consciousness and weakness.  Endo/Heme/Allergies:  Negative for polydipsia.     Current Outpatient Medications:    aspirin EC 81 MG tablet, Take 81 mg by mouth at bedtime. , Disp: , Rfl:    carvedilol  (COREG ) 6.25 MG tablet, Take 1 tablet (6.25 mg total) by mouth 2 (two) times daily., Disp: 180 tablet, Rfl: 3   Copper  Gluconate 2 MG CAPS, Take 1 capsule by mouth daily., Disp: , Rfl:    cyanocobalamin  1000 MCG tablet, Take by mouth., Disp: , Rfl:    fluticasone (FLONASE) 50 MCG/ACT nasal spray, Place 2 sprays into both nostrils daily., Disp: , Rfl:    losartan  (COZAAR ) 25 MG tablet, Take 25 mg by mouth daily., Disp: , Rfl:    metFORMIN  (GLUCOPHAGE -XR) 500 MG 24 hr tablet, Take 1 tablet (500 mg total) by mouth at bedtime., Disp: 90 tablet, Rfl: 1   Polyethyl Glycol-Propyl Glycol (LUBRICANT EYE DROPS) 0.4-0.3 % SOLN, Place 1 drop into both eyes 3 (three) times daily as needed  (dry/irritated eyes.)., Disp: , Rfl:    potassium chloride  (KLOR-CON ) 20 MEQ packet, Take 20 mEq by mouth once., Disp: , Rfl:    rosuvastatin  (CRESTOR ) 20 MG tablet, Take 20 mg by mouth every evening. , Disp: , Rfl:    trospium (SANCTURA) 20 MG tablet, Take 20 mg by mouth 2 (two) times daily., Disp: , Rfl:    brimonidine (ALPHAGAN) 0.2 % ophthalmic solution, Place 1 drop into both eyes 3 (three) times daily. (Patient not taking: Reported on 12/03/2023), Disp: , Rfl:    Multiple Vitamins-Minerals (SYSTANE ICAPS AREDS2) TABS, Take by mouth. (Patient not taking: Reported on 12/03/2023), Disp: , Rfl:    vitamin E 400 UNIT capsule, Take 800 Units by mouth daily.  (Patient not taking: Reported on 12/03/2023), Disp: , Rfl:   Current Facility-Administered Medications:    0.9 %  sodium chloride  infusion, 500 mL, Intravenous, Once, Danis, Victory CROME III, MD   Objective:     BP 129/78   Pulse 86   Temp (!) 96.6 F (35.9 C)   Ht 5' 11 (1.803 m)   Wt 200 lb 3.2 oz (90.8 kg)   SpO2 97%   BMI 27.92 kg/m    Physical Exam Constitutional:      General: He is not in acute distress.    Appearance: Normal appearance. He is not ill-appearing, toxic-appearing or diaphoretic.  HENT:     Head: Normocephalic and  atraumatic.     Right Ear: External ear normal.     Left Ear: External ear normal.  Eyes:     General: No scleral icterus.       Right eye: No discharge.        Left eye: No discharge.     Extraocular Movements: Extraocular movements intact.     Conjunctiva/sclera: Conjunctivae normal.  Pulmonary:     Effort: Pulmonary effort is normal. No respiratory distress.  Skin:    General: Skin is warm and dry.  Neurological:     Mental Status: He is alert and oriented to person, place, and time.  Psychiatric:        Mood and Affect: Mood normal.        Behavior: Behavior normal.      No results found for any visits on 12/03/23.    The ASCVD Risk score (Arnett DK, et al., 2019) failed to  calculate for the following reasons:   The valid total cholesterol range is 130 to 320 mg/dL    Assessment & Plan:   Essential hypertension -     Basic metabolic panel with GFR  Elevated LDL cholesterol level  Prediabetes -     Basic metabolic panel with GFR -     Hemoglobin A1c  Hypokalemia -     Basic metabolic panel with GFR    Return in about 6 months (around 06/02/2024), or if symptoms worsen or fail to improve.    Elsie Sim Lent, MD

## 2023-12-05 ENCOUNTER — Ambulatory Visit: Payer: Self-pay | Admitting: Family Medicine

## 2023-12-12 DIAGNOSIS — L720 Epidermal cyst: Secondary | ICD-10-CM | POA: Diagnosis not present

## 2023-12-12 DIAGNOSIS — L821 Other seborrheic keratosis: Secondary | ICD-10-CM | POA: Diagnosis not present

## 2023-12-12 DIAGNOSIS — D225 Melanocytic nevi of trunk: Secondary | ICD-10-CM | POA: Diagnosis not present

## 2023-12-12 DIAGNOSIS — Z85828 Personal history of other malignant neoplasm of skin: Secondary | ICD-10-CM | POA: Diagnosis not present

## 2023-12-12 DIAGNOSIS — L905 Scar conditions and fibrosis of skin: Secondary | ICD-10-CM | POA: Diagnosis not present

## 2023-12-12 DIAGNOSIS — D692 Other nonthrombocytopenic purpura: Secondary | ICD-10-CM | POA: Diagnosis not present

## 2024-02-23 ENCOUNTER — Other Ambulatory Visit: Payer: Self-pay | Admitting: Family Medicine

## 2024-02-23 DIAGNOSIS — R7303 Prediabetes: Secondary | ICD-10-CM

## 2024-03-27 ENCOUNTER — Ambulatory Visit: Payer: Medicare HMO

## 2024-03-31 ENCOUNTER — Ambulatory Visit

## 2024-06-02 ENCOUNTER — Ambulatory Visit: Admitting: Family Medicine
